# Patient Record
Sex: Female | Born: 1980 | Race: White | Hispanic: No | Marital: Married | Smoking: Never smoker
Health system: Southern US, Community
[De-identification: ages and names within clinical notes are randomized; demographics above are authoritative.]

## PROBLEM LIST (undated history)

## (undated) DIAGNOSIS — I10 Essential (primary) hypertension: Secondary | ICD-10-CM

## (undated) DIAGNOSIS — E119 Type 2 diabetes mellitus without complications: Secondary | ICD-10-CM

## (undated) DIAGNOSIS — K219 Gastro-esophageal reflux disease without esophagitis: Secondary | ICD-10-CM

## (undated) DIAGNOSIS — J45909 Unspecified asthma, uncomplicated: Secondary | ICD-10-CM

## (undated) DIAGNOSIS — Z9889 Other specified postprocedural states: Secondary | ICD-10-CM

## (undated) DIAGNOSIS — R112 Nausea with vomiting, unspecified: Secondary | ICD-10-CM

## (undated) DIAGNOSIS — C801 Malignant (primary) neoplasm, unspecified: Secondary | ICD-10-CM

## (undated) DIAGNOSIS — M1990 Unspecified osteoarthritis, unspecified site: Secondary | ICD-10-CM

## (undated) DIAGNOSIS — H353290 Exudative age-related macular degeneration, unspecified eye, stage unspecified: Secondary | ICD-10-CM

## (undated) HISTORY — DX: Unspecified asthma, uncomplicated: J45.909

---

## 2002-11-01 ENCOUNTER — Emergency Department (HOSPITAL_COMMUNITY): Admission: EM | Admit: 2002-11-01 | Discharge: 2002-11-02 | Payer: Self-pay | Admitting: Internal Medicine

## 2016-02-05 ENCOUNTER — Emergency Department (HOSPITAL_COMMUNITY): Payer: BLUE CROSS/BLUE SHIELD

## 2016-02-05 ENCOUNTER — Emergency Department (HOSPITAL_COMMUNITY)
Admission: EM | Admit: 2016-02-05 | Discharge: 2016-02-05 | Disposition: A | Discharge status: Home / Self Care | Payer: BLUE CROSS/BLUE SHIELD | Attending: Emergency Medicine | Admitting: Emergency Medicine

## 2016-02-05 ENCOUNTER — Encounter (HOSPITAL_COMMUNITY): Payer: Self-pay | Admitting: *Deleted

## 2016-02-05 DIAGNOSIS — M1990 Unspecified osteoarthritis, unspecified site: Secondary | ICD-10-CM | POA: Insufficient documentation

## 2016-02-05 DIAGNOSIS — I10 Essential (primary) hypertension: Secondary | ICD-10-CM | POA: Diagnosis not present

## 2016-02-05 DIAGNOSIS — R51 Headache: Secondary | ICD-10-CM | POA: Diagnosis present

## 2016-02-05 DIAGNOSIS — Z791 Long term (current) use of non-steroidal anti-inflammatories (NSAID): Secondary | ICD-10-CM | POA: Diagnosis not present

## 2016-02-05 DIAGNOSIS — G43909 Migraine, unspecified, not intractable, without status migrainosus: Secondary | ICD-10-CM | POA: Diagnosis not present

## 2016-02-05 DIAGNOSIS — Z7982 Long term (current) use of aspirin: Secondary | ICD-10-CM | POA: Diagnosis not present

## 2016-02-05 DIAGNOSIS — E119 Type 2 diabetes mellitus without complications: Secondary | ICD-10-CM | POA: Insufficient documentation

## 2016-02-05 HISTORY — DX: Gastro-esophageal reflux disease without esophagitis: K21.9

## 2016-02-05 HISTORY — DX: Unspecified osteoarthritis, unspecified site: M19.90

## 2016-02-05 HISTORY — DX: Essential (primary) hypertension: I10

## 2016-02-05 HISTORY — DX: Type 2 diabetes mellitus without complications: E11.9

## 2016-02-05 LAB — CBG MONITORING, ED: GLUCOSE-CAPILLARY: 273 mg/dL — AB (ref 65–99)

## 2016-02-05 LAB — BASIC METABOLIC PANEL
Anion gap: 13 (ref 5–15)
BUN: 15 mg/dL (ref 6–20)
CO2: 27 mmol/L (ref 22–32)
Calcium: 8.8 mg/dL — ABNORMAL LOW (ref 8.9–10.3)
Chloride: 96 mmol/L — ABNORMAL LOW (ref 101–111)
Creatinine, Ser: 0.62 mg/dL (ref 0.44–1.00)
GFR calc Af Amer: >60 mL/min (ref >60)
GFR calc non Af Amer: >60 mL/min (ref >60)
Glucose, Bld: 286 mg/dL — ABNORMAL HIGH (ref 65–99)
Potassium: 3.5 mmol/L (ref 3.5–5.1)
Sodium: 136 mmol/L (ref 135–145)

## 2016-02-05 LAB — BRAIN NATRIURETIC PEPTIDE: B Natriuretic Peptide: 53 pg/mL (ref 0.0–100.0)

## 2016-02-05 LAB — HCG, SERUM, QUALITATIVE: Preg, Serum: NEGATIVE

## 2016-02-05 MED ORDER — VALPROATE SODIUM 500 MG/5ML IV SOLN
INTRAVENOUS | Status: AC
  Filled 2016-02-05: qty 5

## 2016-02-05 MED ORDER — SODIUM CHLORIDE 0.9 % IV BOLUS (SEPSIS)
1000.0000 mL | Freq: Once | INTRAVENOUS | Status: AC
  Administered 2016-02-05: 1000 mL via INTRAVENOUS

## 2016-02-05 MED ORDER — DIPHENHYDRAMINE HCL 50 MG/ML IJ SOLN
25.0000 mg | Freq: Once | INTRAMUSCULAR | Status: AC
  Administered 2016-02-05: 25 mg via INTRAVENOUS
  Filled 2016-02-05: qty 1

## 2016-02-05 MED ORDER — KETOROLAC TROMETHAMINE 30 MG/ML IJ SOLN
30.0000 mg | Freq: Once | INTRAMUSCULAR | Status: AC
  Administered 2016-02-05: 30 mg via INTRAVENOUS
  Filled 2016-02-05: qty 1

## 2016-02-05 MED ORDER — METOCLOPRAMIDE HCL 5 MG/ML IJ SOLN
10.0000 mg | Freq: Once | INTRAMUSCULAR | Status: AC
  Administered 2016-02-05: 10 mg via INTRAVENOUS
  Filled 2016-02-05: qty 2

## 2016-02-05 MED ORDER — VALPROATE SODIUM 500 MG/5ML IV SOLN
500.0000 mg | Freq: Once | INTRAVENOUS | Status: AC
  Administered 2016-02-05: 500 mg via INTRAVENOUS
  Filled 2016-02-05: qty 5

## 2016-02-05 NOTE — Discharge Instructions (Signed)

## 2016-02-05 NOTE — ED Provider Notes (Signed)
CSN: [id number]     Arrival date & time 02/05/16  1737 History   First MD Initiated Contact with Patient 02/05/16 1908     Chief Complaint  Patient presents with  . Headache     HPI  Patient presents evaluation of headache and vomiting. She had 4 days of nausea and vomiting and was seen at [facility]. Had IV fluids and antiemetics and has done better. However the day after discharge which was yesterday she developed a headache. States it is global. Some facial however occipital as well. Not sudden in onset. She awakened with it yesterday morning. Does not feel as though it awakened her from sleep. No neck pain or stiffness. No fevers or chills. Again nausea his been better over the last 2 days next taking by mouth well today.  Past Medical History  Diagnosis Date  . Arthritis   . Diabetes mellitus without complication ([place])   . Hypertension   . GERD (gastroesophageal reflux disease)    History reviewed. No pertinent past surgical history. No family history on file. Social History  Substance Use Topics  . Smoking status: Never Smoker   . Smokeless tobacco: None  . Alcohol Use: No   OB History    No data available     Review of Systems  Constitutional: Negative for fever, chills, diaphoresis, appetite change and fatigue.  HENT: Negative for mouth sores, sore throat and trouble swallowing.   Eyes: Negative for visual disturbance.  Respiratory: Negative for cough, chest tightness, shortness of breath and wheezing.   Cardiovascular: Negative for chest pain.  Gastrointestinal: Positive for nausea. Negative for vomiting, abdominal pain and abdominal distention.  Endocrine: Negative for polydipsia, polyphagia and polyuria.  Genitourinary: Negative for dysuria, frequency and hematuria.  Musculoskeletal: Negative for gait problem.  Skin: Negative for color change, pallor and rash.  Neurological: Positive for headaches. Negative for dizziness, syncope and light-headedness.   Hematological: Does not bruise/bleed easily.  Psychiatric/Behavioral: Negative for behavioral problems and confusion.      Allergies  Review of patient's allergies indicates no known allergies.  Home Medications   Prior to Admission medications   Medication Sig Start Date End Date Taking? Authorizing Provider  ibuprofen (ADVIL,MOTRIN) 200 MG tablet Take 400 mg by mouth every 6 (six) hours as needed for mild pain or moderate pain.   Yes Historical Provider, MD  Phenylephrine-Aspirin (ALKA-SELTZER PLUS SINUS) 7.8-325 MG TBEF Take 1 tablet by mouth daily as needed (for headache/sinus pain).   Yes Historical Provider, MD   BP 133/96 mmHg  Pulse 99  Temp(Src) 98.3 F (36.8 C) (Oral)  Resp 18  Ht 5\' 1"  (1.549 m)  Wt 177 lb (80.287 kg)  BMI 33.46 kg/m2  SpO2 97% Physical Exam  Constitutional: She is oriented to person, place, and time. She appears well-developed and well-nourished. No distress.  HENT:  Head: Normocephalic.  Complains of discomfort over her maxilla and frontal sinuses. Nares show some slight congestion. He also drinks benign. TMs appear normal.  Eyes: Conjunctivae are normal. Pupils are equal, round, and reactive to light. No scleral icterus.  Neck: Normal range of motion. Neck supple. No thyromegaly present.  Neck is supple.  Cardiovascular: Normal rate and regular rhythm.  Exam reveals no gallop and no friction rub.   No murmur heard. Pulmonary/Chest: Effort normal and breath sounds normal. No respiratory distress. She has no wheezes. She has no rales.  Abdominal: Soft. Bowel sounds are normal. She exhibits no distension. There is no tenderness. There  is no rebound.  Musculoskeletal: Normal range of motion.  Neurological: She is alert and oriented to person, place, and time.  Normal neurological exam. Symmetric cranial nerves. Normal gait. Normal strength four extremities  Skin: Skin is warm and dry. No rash noted.  Psychiatric: She has a normal mood and affect.  Her behavior is normal.    ED Course  Procedures (including critical care time) Labs Review Labs Reviewed  BASIC METABOLIC PANEL - Abnormal; Notable for the following:    Chloride 96 (*)    Glucose, Bld 286 (*)    Calcium 8.8 (*)    All other components within normal limits  CBG MONITORING, ED - Abnormal; Notable for the following:    Glucose-Capillary 273 (*)    All other components within normal limits  BRAIN NATRIURETIC PEPTIDE  HCG, SERUM, QUALITATIVE    Imaging Review Ct Head Wo Contrast  02/05/2016  CLINICAL DATA:  Headache and dizziness; facial pressure.  Nausea. EXAM: CT HEAD WITHOUT CONTRAST CT MAXILLOFACIAL WITHOUT CONTRAST TECHNIQUE: Multidetector CT imaging of the head and maxillofacial structures were performed using the standard protocol without intravenous contrast. Multiplanar CT image reconstructions of the maxillofacial structures were also generated. COMPARISON:  None. FINDINGS: CT HEAD FINDINGS The ventricles are normal in size and configuration. There is no intracranial mass, hemorrhage, extra-axial fluid collection, or midline shift. The gray-white compartments are normal. No acute infarct evident. The bony calvarium appears intact. The mastoid air cells are clear. No intraorbital lesions are evident. CT MAXILLOFACIAL FINDINGS There is no demonstrable fracture or dislocation. No intraorbital lesions are identified. Orbits appear symmetric bilaterally. There is mild mucosal thickening in several ethmoid air cells. Paranasal sinuses elsewhere are clear. There is no air-fluid level. No bony destruction or expansion. Ostiomeatal unit complexes are patent bilaterally. There are concha bullosae bilaterally common anatomic variant. There is leftward deviation of the nasal septum. There is edema of the nasal turbinates on the left with narrowing of the left naris. Salivary glands appear symmetric and normal bilaterally. No adenopathy. Visualized pharynx appears normal. There are  prominent cavities in lower molars on the right. IMPRESSION: CT head:  Study within normal limits. CT maxillofacial: Mild mucosal thickening in several ethmoid air cells bilaterally. Other paranasal sinuses are clear. No air-fluid level. No bony destruction or expansion. There is mild leftward deviation of the nasal septum. There is nasal turbinate edema on the left with narrowing of the left naris. Ostiomeatal unit complexes appear patent bilaterally. No fracture or dislocation. Orbits appear symmetric and unremarkable bilaterally. Note that there are prominent cavities in lower molars on the right. Electronically Signed   By: Lowella Grip III M.D.   On: 02/05/2016 21:17   Ct Maxillofacial Wo Cm  02/05/2016  CLINICAL DATA:  Headache and dizziness; facial pressure.  Nausea. EXAM: CT HEAD WITHOUT CONTRAST CT MAXILLOFACIAL WITHOUT CONTRAST TECHNIQUE: Multidetector CT imaging of the head and maxillofacial structures were performed using the standard protocol without intravenous contrast. Multiplanar CT image reconstructions of the maxillofacial structures were also generated. COMPARISON:  None. FINDINGS: CT HEAD FINDINGS The ventricles are normal in size and configuration. There is no intracranial mass, hemorrhage, extra-axial fluid collection, or midline shift. The gray-white compartments are normal. No acute infarct evident. The bony calvarium appears intact. The mastoid air cells are clear. No intraorbital lesions are evident. CT MAXILLOFACIAL FINDINGS There is no demonstrable fracture or dislocation. No intraorbital lesions are identified. Orbits appear symmetric bilaterally. There is mild mucosal thickening in several ethmoid air cells.  Paranasal sinuses elsewhere are clear. There is no air-fluid level. No bony destruction or expansion. Ostiomeatal unit complexes are patent bilaterally. There are concha bullosae bilaterally common anatomic variant. There is leftward deviation of the nasal septum. There is  edema of the nasal turbinates on the left with narrowing of the left naris. Salivary glands appear symmetric and normal bilaterally. No adenopathy. Visualized pharynx appears normal. There are prominent cavities in lower molars on the right. IMPRESSION: CT head:  Study within normal limits. CT maxillofacial: Mild mucosal thickening in several ethmoid air cells bilaterally. Other paranasal sinuses are clear. No air-fluid level. No bony destruction or expansion. There is mild leftward deviation of the nasal septum. There is nasal turbinate edema on the left with narrowing of the left naris. Ostiomeatal unit complexes appear patent bilaterally. No fracture or dislocation. Orbits appear symmetric and unremarkable bilaterally. Note that there are prominent cavities in lower molars on the right. Electronically Signed   By: Lowella Grip III M.D.   On: 02/05/2016 21:17   I have personally reviewed and evaluated these images and lab results as part of my medical decision-making.   EKG Interpretation None      MDM   Final diagnoses:  Migraine without status migrainosus, not intractable, unspecified migraine type   Imaging is normal. No gross sinus abnormalities. Orally for headache with cocktail here. Discharge home. Primary care follow-up.  He is hyperglycemic here, but not acidotic. She feels comfortable managing her sugars at home.    Tanna Furry, MD 02/06/16 0001

## 2016-02-05 NOTE — ED Notes (Addendum)
Wednesday morning she woke up with a headache with dizziness present as well. Pt has tried OTC headache medication with no relief. Pt denies any unilateral weakness. Pt does have nausea, denies vomiting.

## 2017-09-05 HISTORY — PX: LAPAROSCOPIC SALPINGOOPHERECTOMY: SUR795

## 2018-04-05 DIAGNOSIS — C801 Malignant (primary) neoplasm, unspecified: Secondary | ICD-10-CM

## 2018-04-05 HISTORY — DX: Malignant (primary) neoplasm, unspecified: C80.1

## 2018-04-18 ENCOUNTER — Telehealth: Payer: Self-pay | Admitting: *Deleted

## 2018-04-18 NOTE — Telephone Encounter (Addendum)
Called and spoke with the patient regarding her new patient appt. Patient given new appt for tomorrow at [place] at [facility] at [place] and gave appt

## 2018-04-19 ENCOUNTER — Inpatient Hospital Stay: Payer: BLUE CROSS/BLUE SHIELD | Attending: Obstetrics | Admitting: Obstetrics

## 2018-04-19 ENCOUNTER — Other Ambulatory Visit (HOSPITAL_COMMUNITY)
Admission: RE | Admit: 2018-04-19 | Discharge: 2018-04-19 | Disposition: A | Discharge status: Home / Self Care | Payer: BLUE CROSS/BLUE SHIELD | Source: Ambulatory Visit | Attending: Obstetrics | Admitting: Obstetrics

## 2018-04-19 ENCOUNTER — Encounter: Payer: Self-pay | Admitting: Obstetrics

## 2018-04-19 VITALS — BP 121/87 | HR 98 | Temp 98.7°F | Resp 20 | Ht 61.5 in | Wt 166.8 lb

## 2018-04-19 DIAGNOSIS — C541 Malignant neoplasm of endometrium: Secondary | ICD-10-CM | POA: Insufficient documentation

## 2018-04-19 DIAGNOSIS — R87618 Other abnormal cytological findings on specimens from cervix uteri: Secondary | ICD-10-CM | POA: Diagnosis not present

## 2018-04-19 DIAGNOSIS — E119 Type 2 diabetes mellitus without complications: Secondary | ICD-10-CM | POA: Insufficient documentation

## 2018-04-19 DIAGNOSIS — I10 Essential (primary) hypertension: Secondary | ICD-10-CM | POA: Insufficient documentation

## 2018-04-19 DIAGNOSIS — Z5111 Encounter for antineoplastic chemotherapy: Secondary | ICD-10-CM | POA: Insufficient documentation

## 2018-04-19 DIAGNOSIS — R110 Nausea: Secondary | ICD-10-CM | POA: Insufficient documentation

## 2018-04-19 NOTE — H&P (View-Only) (Signed)
[place] at [facility] Note: New Patient FIRST VISIT   Consult was requested by Dr. 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  In addition an endometrial biopsy showing the same.  Chief Complaint  Patient presents with  . Endometrial ca [facility])    HPI: Ms. Lanique Gonzalo  is a very nice 37 y.o.  P0  She was in her usual state of health until the evening of 04/06/18 at which time she woke up with significant right lower quadrant pain.  She presented to the emergency room after the pain woke her up CT imaging was performed 04/07/2018 [facility] rocking him.  A 6.5 x 7 x 8.1 complex right adnexal mass was found with solid and fat components consistent with a teratoma.  No lymphadenopathy.  Small to moderate ascites and inflammatory changes were seen in the pelvis follow-up ultrasound was also performed showing concern for right ovarian torsion with a large ovarian mass cystic and solid components 9.7 cm.  A left ovarian cyst seen 3.4 cm.  Endometrial thickening with hypervascularity and endometrial thickness of 24 mm moderate pelvic free fluid.  Given the concern for torsion she was taken urgently to the operating room by Dr. Orson Ape. McLeod 04/07/2018.  At that time operative laparoscopy with right subbing oophorectomy and findings consistent with torsion was performed.  In addition endometrial biopsy was performed.  Unfortunately final pathology reveals in the right ovary and endometrioid type adenocarcinoma the endometrial biopsy showed endometrioid type adenocarcinoma.  Comments include a favoring of endometrium as the primary.  The patient is therefore referred for recommendations and probable management.  She denied nausea , vomiting, denied recent bloating or pain denied any changes in her bowel habits or urinary habits.  She denies excess weight loss and she states she has a normal  appetite.  She does admit to a long history of abnormal menses.  She states that she started her menstrual cycle at age 59.  There is documentation on the chart of possible PCOS.  Periods are described as both irregular and heavy with episodes intermittently of amenorrhea.  Imported EPIC Oncologic History:   No history exists.    ECOG PERFORMANCE STATUS: 1 - Symptomatic but completely ambulatory  Measurement of disease:  Plan for Ca1 25 on the preop labs which may be falsely elevated given the recent surgery. .   Radiology: . No results found. . 04/07/18 - CT AP and TVUS as noted in HPI  Outpatient Encounter Medications as of 04/19/2018  Medication Sig  . amLODipine (NORVASC) 5 MG tablet Take by mouth.  . empagliflozin (JARDIANCE) 10 MG TABS tablet Take by mouth.  Marland Kitchen lisinopril (PRINIVIL,ZESTRIL) 20 MG tablet   . metFORMIN (GLUCOPHAGE) 500 MG tablet Take by mouth.  . insulin degludec (TRESIBA FLEXTOUCH) 100 UNIT/ML SOPN FlexTouch Pen Pt said she is not sure of dose, she goes to 13 clicks over. Not taken since surgery because it is an injection abd  . [DISCONTINUED] ibuprofen (ADVIL,MOTRIN) 200 MG tablet Take 400 mg by mouth every 6 (six) hours as needed for mild pain or moderate pain.  . [DISCONTINUED] Phenylephrine-Aspirin (ALKA-SELTZER PLUS SINUS) 7.8-325 MG TBEF Take 1 tablet by mouth daily as needed (for headache/sinus pain).   No facility-administered encounter medications on file as of 04/19/2018.    No Known Allergies  Past Medical History:  Diagnosis Date  . Arthritis   . Asthma   .  Diabetes mellitus without complication ([place])   . GERD (gastroesophageal reflux disease)   . Hypertension   . Torsion of ovary or tube    Past Surgical History:  Procedure Laterality Date  . LAPAROSCOPIC SALPINGOOPHERECTOMY Right 2019   torsion        Past Gynecological History:   GYNECOLOGIC HISTORY:  . No LMP recorded. (Menstrual status: Irregular Periods).  . Menarche: 38 years old . P  0 . Contraceptive none . HRT N/A  . Last Pap sure Family Hx:  Family History  Problem Relation Age of Onset  . Diabetes Mother   . Cervical cancer Mother   . Heart attack Father   . Diabetes Brother    Social Hx:  Marland Kitchen Tobacco use: None . Alcohol use: None . Illicit Drug use: None . Illicit IV Drug use: Never    Review of Systems: Review of Systems  Genitourinary: Positive for vaginal bleeding.   All other systems reviewed and are negative.  Vitals: Blood pressure 121/87, pulse 98, temperature 98.7 F (37.1 C), temperature source Oral, resp. rate 20, height 5' 1.5" (1.562 m), weight 166 lb 12.8 oz (75.7 kg), SpO2 100 %. Vitals:   04/19/18 1252  Weight: 166 lb 12.8 oz (75.7 kg)  Height: 5' 1.5" (1.562 m)    Vitals:   04/19/18 1252  BP: 121/87  Pulse: 98  Resp: 20  Temp: 98.7 F (37.1 C)  SpO2: 100%   Body mass index is 31.01 kg/m.   Physical Exam: General :  Obese well developed, 37 y.o., female in no apparent distress HEENT:  Normocephalic/atraumatic, symmetric, EOMI, eyelids normal Neck:   Supple, no masses.  Lymphatics:  No cervical/ submandibular/ supraclavicular/ infraclavicular/ inguinal adenopathy Respiratory:  Respirations unlabored, no use of accessory muscles CV:   Deferred Breast:  Deferred Musculoskeletal: No CVA tenderness, normal muscle strength. Abdomen:  Small pannus, trocar sites are healing. Soft, non-tender and nondistended. No evidence of hernia. No masses. Extremities:  No lymphedema, no erythema, non-tender. Skin:   Normal inspection Neuro/Psych:  No focal motor deficit, no abnormal mental status. Normal gait. Normal affect. Alert and oriented to person, place, and time  Genito Urinary: Vulva: Normal external female genitalia.  Bladder/urethra: Urethral meatus normal in size and location. No lesions or   masses, well supported bladder Speculum exam: Vagina: No lesion, no discharge, no bleeding. Cervix: Normal appearing, no  lesions. Bimanual exam:  Uterus: Normal size, mobile.  Adnexa: No masses. Rectovaginal:  Good tone, no masses, no cul de sac nodularity, no parametrial involvement or nodularity.   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 Data reviewed ? I reviewed the radiology reports as the images were not available for my review and discussed my interpretation with the patient ? Specifically the uterine lining is thickened quite a bit at 29mm on TVUS.  ? Reassuring is the absence of obvious LAD on CT imaging. ? The left ovary on TVUS also has a cyst with "irregular nodules" so that is a little concerning.  ? I reviewed her referring doctor's office notes and I have summarized in the HPI ? History was obtained from the patient and her husband ? She has no tumor marker for review but that will be ordered on the preop labs 2. Etiology of disease seems to be the uterus. Double primary needs to be kept in mind, however there may be no way to prove that definitively and like we will be recommending chemotherapy  postop. We discussed this today. 3. We did discuss referral to genetics today given her young age and that will be done on a future visit 4. Surgery discussion ? We reviewed the risks, benefits, and alternatives of surgery today. We also reviewed the surgical sketch and a copy was given to her today ? Recommendation is for completion surgery laparoscopic (robotic) hysterectomy, LSO, omentectomy and washings. She is already presumed Stage 3 uterine and given recent data from [facility] showing lack of benefit for adjuvant chemo-radiation therapy over chemotherapy alone I do not see a benefit to nodal dissection. We did discuss this in simple terms today 5. Pending items ? Pap done today with HPV ? CA125 6. RTC 10-14 days postop   Face to face time with patient was >60 minutes. Over 50% of this time was spent on counseling and coordination of care.  [name], MD  04/19/2018, 2:21 PM    Cc: Albertina Parr, MD (Referring Ob/Gyn) Monico Blitz, MD (PCP)

## 2018-04-19 NOTE — Progress Notes (Addendum)
[place] at [facility] Note: New Patient FIRST VISIT   Consult was requested by Dr. 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  In addition an endometrial biopsy showing the same.  Chief Complaint  Patient presents with  . Endometrial ca [facility])    HPI: [name]  is a very nice 37 y.o.  P0  She was in her usual state of health until the evening of 04/06/18 at which time she woke up with significant right lower quadrant pain.  She presented to the emergency room after the pain woke her up CT imaging was performed 04/07/2018 [facility] rocking him.  A 6.5 x 7 x 8.1 complex right adnexal mass was found with solid and fat components consistent with a teratoma.  No lymphadenopathy.  Small to moderate ascites and inflammatory changes were seen in the pelvis follow-up ultrasound was also performed showing concern for right ovarian torsion with a large ovarian mass cystic and solid components 9.7 cm.  A left ovarian cyst seen 3.4 cm.  Endometrial thickening with hypervascularity and endometrial thickness of 24 mm moderate pelvic free fluid.  Given the concern for torsion she was taken urgently to the operating room by Dr. Orson Ape. McLeod 04/07/2018.  At that time operative laparoscopy with right subbing oophorectomy and findings consistent with torsion was performed.  In addition endometrial biopsy was performed.  Unfortunately final pathology reveals in the right ovary and endometrioid type adenocarcinoma the endometrial biopsy showed endometrioid type adenocarcinoma.  Comments include a favoring of endometrium as the primary.  The patient is therefore referred for recommendations and probable management.  She denied nausea , vomiting, denied recent bloating or pain denied any changes in her bowel habits or urinary habits.  She denies excess weight loss and she states she has a normal  appetite.  She does admit to a long history of abnormal menses.  She states that she started her menstrual cycle at age 48.  There is documentation on the chart of possible PCOS.  Periods are described as both irregular and heavy with episodes intermittently of amenorrhea.  Imported EPIC Oncologic History:   No history exists.    ECOG PERFORMANCE STATUS: 1 - Symptomatic but completely ambulatory  Measurement of disease:  Plan for Ca1 25 on the preop labs which may be falsely elevated given the recent surgery. .   Radiology: . No results found. . 04/07/18 - CT AP and TVUS as noted in HPI  Outpatient Encounter Medications as of 04/19/2018  Medication Sig  . amLODipine (NORVASC) 5 MG tablet Take by mouth.  . empagliflozin (JARDIANCE) 10 MG TABS tablet Take by mouth.  Marland Kitchen lisinopril (PRINIVIL,ZESTRIL) 20 MG tablet   . metFORMIN (GLUCOPHAGE) 500 MG tablet Take by mouth.  . insulin degludec (TRESIBA FLEXTOUCH) 100 UNIT/ML SOPN FlexTouch Pen Pt said she is not sure of dose, she goes to 13 clicks over. Not taken since surgery because it is an injection abd  . [DISCONTINUED] ibuprofen (ADVIL,MOTRIN) 200 MG tablet Take 400 mg by mouth every 6 (six) hours as needed for mild pain or moderate pain.  . [DISCONTINUED] Phenylephrine-Aspirin (ALKA-SELTZER PLUS SINUS) 7.8-325 MG TBEF Take 1 tablet by mouth daily as needed (for headache/sinus pain).   No facility-administered encounter medications on file as of 04/19/2018.    No Known Allergies  Past Medical History:  Diagnosis Date  . Arthritis   . Asthma   .  Diabetes mellitus without complication ([place])   . GERD (gastroesophageal reflux disease)   . Hypertension   . Torsion of ovary or tube    Past Surgical History:  Procedure Laterality Date  . LAPAROSCOPIC SALPINGOOPHERECTOMY Right 2019   torsion        Past Gynecological History:   GYNECOLOGIC HISTORY:  . No LMP recorded. (Menstrual status: Irregular Periods).  . Menarche: 37 years old . P  0 . Contraceptive none . HRT N/A  . Last Pap sure Family Hx:  Family History  Problem Relation Age of Onset  . Diabetes Mother   . Cervical cancer Mother   . Heart attack Father   . Diabetes Brother    Social Hx:  Marland Kitchen Tobacco use: None . Alcohol use: None . Illicit Drug use: None . Illicit IV Drug use: Never    Review of Systems: Review of Systems  Genitourinary: Positive for vaginal bleeding.   All other systems reviewed and are negative.  Vitals: Blood pressure 121/87, pulse 98, temperature 98.7 F (37.1 C), temperature source Oral, resp. rate 20, height 5' 1.5" (1.562 m), weight 166 lb 12.8 oz (75.7 kg), SpO2 100 %. Vitals:   04/19/18 1252  Weight: 166 lb 12.8 oz (75.7 kg)  Height: 5' 1.5" (1.562 m)    Vitals:   04/19/18 1252  BP: 121/87  Pulse: 98  Resp: 20  Temp: 98.7 F (37.1 C)  SpO2: 100%   Body mass index is 31.01 kg/m.   Physical Exam: General :  Obese well developed, 37 y.o., female in no apparent distress HEENT:  Normocephalic/atraumatic, symmetric, EOMI, eyelids normal Neck:   Supple, no masses.  Lymphatics:  No cervical/ submandibular/ supraclavicular/ infraclavicular/ inguinal adenopathy Respiratory:  Respirations unlabored, no use of accessory muscles CV:   Deferred Breast:  Deferred Musculoskeletal: No CVA tenderness, normal muscle strength. Abdomen:  Small pannus, trocar sites are healing. Soft, non-tender and nondistended. No evidence of hernia. No masses. Extremities:  No lymphedema, no erythema, non-tender. Skin:   Normal inspection Neuro/Psych:  No focal motor deficit, no abnormal mental status. Normal gait. Normal affect. Alert and oriented to person, place, and time  Genito Urinary: Vulva: Normal external female genitalia.  Bladder/urethra: Urethral meatus normal in size and location. No lesions or   masses, well supported bladder Speculum exam: Vagina: No lesion, no discharge, no bleeding. Cervix: Normal appearing, no  lesions. Bimanual exam:  Uterus: Normal size, mobile.  Adnexa: No masses. Rectovaginal:  Good tone, no masses, no cul de sac nodularity, no parametrial involvement or nodularity.   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 Data reviewed ? I reviewed the radiology reports as the images were not available for my review and discussed my interpretation with the patient ? Specifically the uterine lining is thickened quite a bit at 81mm on TVUS.  ? Reassuring is the absence of obvious LAD on CT imaging. ? The left ovary on TVUS also has a cyst with "irregular nodules" so that is a little concerning.  ? I reviewed her referring doctor's office notes and I have summarized in the HPI ? History was obtained from the patient and her husband ? She has no tumor marker for review but that will be ordered on the preop labs 2. Etiology of disease seems to be the uterus. Double primary needs to be kept in mind, however there may be no way to prove that definitively and like we will be recommending chemotherapy  postop. We discussed this today. 3. We did discuss referral to genetics today given her young age and that will be done on a future visit 4. Surgery discussion ? We reviewed the risks, benefits, and alternatives of surgery today. We also reviewed the surgical sketch and a copy was given to her today ? Recommendation is for completion surgery laparoscopic (robotic) hysterectomy, LSO, omentectomy and washings. She is already presumed Stage 3 uterine and given recent data from [facility] showing lack of benefit for adjuvant chemo-radiation therapy over chemotherapy alone I do not see a benefit to nodal dissection. We did discuss this in simple terms today 5. Pending items ? Pap done today with HPV ? CA125 6. RTC 10-14 days postop   Face to face time with patient was >60 minutes. Over 50% of this time was spent on counseling and coordination of care.  [name], MD  04/19/2018, 2:21 PM    Cc: Albertina Parr, MD (Referring Ob/Gyn) Monico Blitz, MD (PCP)

## 2018-04-19 NOTE — Patient Instructions (Signed)
Preparing for your Surgery  Plan for surgery on April 24, 2018 with Dr. Precious Haws at [place] will be scheduled for a robotic assisted total laparoscopic hysterectomy, unilateral salpingo-oophorectomy, omentectomy, possible laparotomy.  Pre-operative Testing -You will receive a phone call from presurgical testing at [facility] to arrange for a pre-operative testing appointment before your surgery.  This appointment normally occurs one to two weeks before your scheduled surgery.   -Bring your insurance card, copy of an advanced directive if applicable, medication list  -At that visit, you will be asked to sign a consent for a possible blood transfusion in case a transfusion becomes necessary during surgery.  The need for a blood transfusion is rare but having consent is a necessary part of your care.     -You should not be taking blood thinners or aspirin at least ten days prior to surgery unless instructed by your surgeon.  Day Before Surgery at [place] will be asked to take in a light diet the day before surgery.  Avoid carbonated beverages.  You will be advised to have nothing to eat or drink after midnight the evening before.    Eat a light diet the day before surgery.  Examples including soups, broths, toast, yogurt, mashed potatoes.  Things to avoid include carbonated beverages (fizzy beverages), raw fruits and raw vegetables, or beans.   If your bowels are filled with gas, your surgeon will have difficulty visualizing your pelvic organs which increases your surgical risks.  Your role in recovery Your role is to become active as soon as directed by your doctor, while still giving yourself time to heal.  Rest when you feel tired. You will be asked to do the following in order to speed your recovery:  - Cough and breathe deeply. This helps toclear and expand your lungs and can prevent pneumonia. You may be given a spirometer to practice deep  breathing. A staff member will show you how to use the spirometer. - Do mild physical activity. Walking or moving your legs help your circulation and body functions return to normal. A staff member will help you when you try to walk and will provide you with simple exercises. Do not try to get up or walk alone the first time. - Actively manage your pain. Managing your pain lets you move in comfort. We will ask you to rate your pain on a scale of zero to 10. It is your responsibility to tell your doctor or nurse where and how much you hurt so your pain can be treated.  Special Considerations -If you are diabetic, you may be placed on insulin after surgery to have closer control over your blood sugars to promote healing and recovery.  This does not mean that you will be discharged on insulin.  If applicable, your oral antidiabetics will be resumed when you are tolerating a solid diet.  -Your final pathology results from surgery should be available by the Friday after surgery and the results will be relayed to you when available.  -Dr. Lahoma Crocker is the Surgeon that assists your GYN Oncologist with surgery.  The next day after your surgery you will either see your GYN Oncologist or Dr. Lahoma Crocker.   Blood Transfusion Information WHAT IS A BLOOD TRANSFUSION? A transfusion is the replacement of blood or some of its parts. Blood is made up of multiple cells which provide different functions.  Red blood cells carry oxygen and are used for blood  loss replacement.  White blood cells fight against infection.  Platelets control bleeding.  Plasma helps clot blood.  Other blood products are available for specialized needs, such as hemophilia or other clotting disorders. BEFORE THE TRANSFUSION  Who gives blood for transfusions?   You may be able to donate blood to be used at a later date on yourself (autologous donation).  Relatives can be asked to donate blood. This is generally not  any safer than if you have received blood from a stranger. The same precautions are taken to ensure safety when a relative's blood is donated.  Healthy volunteers who are fully evaluated to make sure their blood is safe. This is blood bank blood. Transfusion therapy is the safest it has ever been in the practice of medicine. Before blood is taken from a donor, a complete history is taken to make sure that person has no history of diseases nor engages in risky social behavior (examples are intravenous drug use or sexual activity with multiple partners). The donor's travel history is screened to minimize risk of transmitting infections, such as malaria. The donated blood is tested for signs of infectious diseases, such as HIV and hepatitis. The blood is then tested to be sure it is compatible with you in order to minimize the chance of a transfusion reaction. If you or a relative donates blood, this is often done in anticipation of surgery and is not appropriate for emergency situations. It takes many days to process the donated blood. RISKS AND COMPLICATIONS Although transfusion therapy is very safe and saves many lives, the main dangers of transfusion include:   Getting an infectious disease.  Developing a transfusion reaction. This is an allergic reaction to something in the blood you were given. Every precaution is taken to prevent this. The decision to have a blood transfusion has been considered carefully by your caregiver before blood is given. Blood is not given unless the benefits outweigh the risks.

## 2018-04-20 ENCOUNTER — Encounter: Payer: Self-pay | Admitting: Obstetrics

## 2018-04-20 ENCOUNTER — Other Ambulatory Visit: Payer: Self-pay | Admitting: Gynecologic Oncology

## 2018-04-20 ENCOUNTER — Telehealth: Payer: Self-pay

## 2018-04-20 LAB — CYTOLOGY - PAP: HPV: NOT DETECTED

## 2018-04-20 NOTE — Patient Instructions (Addendum)
Kathryn Stewart  04/20/2018   Your procedure is scheduled on: Tuesday 0820/2019  Report to [facility] Main  Entrance              Report to admitting at   1100   AM    Call this number if you have problems the morning of surgery [phone]   Eat a light diet the day before surgery.  Examples including soups, broths, toast, yogurt, mashed potatoes.  Things to avoid include carbonated beverages (fizzy beverages), raw fruits and raw vegetables, or beans.   If your bowels are filled with gas, your surgeon will have difficulty visualizing your pelvic organs which increases your surgical risks.  No solid food after midnight ! May have clear liquids from midnight up until 0730 am then nothing until after surgery!  CLEAR LIQUID DIET   Foods Allowed                                                                     Foods Excluded  Coffee and tea, regular and decaf                             liquids that you cannot  Plain Jell-O in any flavor                                             see through such as: Fruit ices (not with fruit pulp)                                     milk, soups, orange juice  Iced Popsicles                                    All solid food Carbonated beverages, regular and diet                                    Cranberry, grape and apple juices Sports drinks like Gatorade Lightly seasoned clear broth or consume(fat free) Sugar, honey syrup  Sample Menu Breakfast                                Lunch                                     Supper Cranberry juice                    Beef broth                            Chicken broth Jell-O  Grape juice                           Apple juice Coffee or tea                        Jell-O                                      Popsicle                                                Coffee or tea                        Coffee or  tea  _____________________________________________________________________  How to Manage Your Diabetes Before and After Surgery  Why is it important to control my blood sugar before and after surgery? . Improving blood sugar levels before and after surgery helps healing and can limit problems. . A way of improving blood sugar control is eating a healthy diet by: o  Eating less sugar and carbohydrates o  Increasing activity/exercise o  Talking with your doctor about reaching your blood sugar goals . High blood sugars (greater than 180 mg/dL) can raise your risk of infections and slow your recovery, so you will need to focus on controlling your diabetes during the weeks before surgery. . Make sure that the doctor who takes care of your diabetes knows about your planned surgery including the date and location.  How do I manage my blood sugar before surgery? . Check your blood sugar at least 4 times a day, starting 2 days before surgery, to make sure that the level is not too high or low. o Check your blood sugar the morning of your surgery when you wake up and every 2 hours until you get to the Short Stay unit. . If your blood sugar is less than 70 mg/dL, you will need to treat for low blood sugar: o Do not take insulin. o Treat a low blood sugar (less than 70 mg/dL) with  cup of clear juice (cranberry or apple), 4 glucose tablets, OR glucose gel. o Recheck blood sugar in 15 minutes after treatment (to make sure it is greater than 70 mg/dL). If your blood sugar is not greater than 70 mg/dL on recheck, call [phone] for further instructions. . Report your blood sugar to the short stay nurse when you get to Short Stay.  . If you are admitted to the hospital after surgery: o Your blood sugar will be checked by the staff and you will probably be given insulin after surgery (instead of oral diabetes medicines) to make sure you have good blood sugar levels. o The goal for blood sugar control  after surgery is 80-180 mg/dL.   WHAT DO I DO ABOUT MY DIABETES MEDICATION?        Take the day before surgery , Metformin (Glucophagwe ) as usual!         Take the day before surgery, Empagliflozin (jardiance) as usual!  . Do not take oral diabetes medicines (pills) the morning of surgery!       . THE MORNING OF SURGERY, take 6  units of  Tresiba       insulin.  .   Remember: Do not eat food or drink liquids :After Midnight.     Take these medicines the morning of surgery with A SIP OF WATER: NONE  DO NOT TAKE ANY DIABETIC MEDICATIONS DAY OF YOUR SURGERY                               You may not have any metal on your body including hair pins and              piercings  Do not wear jewelry, make-up, lotions, powders or perfumes, deodorant             Do not wear nail polish.  Do not shave  48 hours prior to surgery.                Do not bring valuables to the hospital. [place].  Contacts, dentures or bridgework may not be worn into surgery.  Leave suitcase in the car. After surgery it may be brought to your room.                   Please read over the following fact sheets you were given: _____________________________________________________________________             [facility] - Preparing for Surgery Before surgery, you can play an important role.  Because skin is not sterile, your skin needs to be as free of germs as possible.  You can reduce the number of germs on your skin by washing with CHG (chlorahexidine gluconate) soap before surgery.  CHG is an antiseptic cleaner which kills germs and bonds with the skin to continue killing germs even after washing. Please DO NOT use if you have an allergy to CHG or antibacterial soaps.  If your skin becomes reddened/irritated stop using the CHG and inform your nurse when you arrive at Short Stay. Do not shave (including legs and underarms) for at least 48 hours prior to  the first CHG shower.  You may shave your face/neck. Please follow these instructions carefully:  1.  Shower with CHG Soap the night before surgery and the  morning of Surgery.  2.  If you choose to wash your hair, wash your hair first as usual with your  normal  shampoo.  3.  After you shampoo, rinse your hair and body thoroughly to remove the  shampoo.                           4.  Use CHG as you would any other liquid soap.  You can apply chg directly  to the skin and wash                       Gently with a scrungie or clean washcloth.  5.  Apply the CHG Soap to your body ONLY FROM THE NECK DOWN.   Do not use on face/ open                           Wound or open sores. Avoid contact with eyes, ears mouth and genitals (private parts).  Wash face,  Genitals (private parts) with your normal soap.             6.  Wash thoroughly, paying special attention to the area where your surgery  will be performed.  7.  Thoroughly rinse your body with warm water from the neck down.  8.  DO NOT shower/wash with your normal soap after using and rinsing off  the CHG Soap.                9.  Pat yourself dry with a clean towel.            10.  Wear clean pajamas.            11.  Place clean sheets on your bed the night of your first shower and do not  sleep with pets. Day of Surgery : Do not apply any lotions/deodorants the morning of surgery.  Please wear clean clothes to the hospital/surgery center.  FAILURE TO FOLLOW THESE INSTRUCTIONS MAY RESULT IN THE CANCELLATION OF YOUR SURGERY PATIENT SIGNATURE_________________________________  NURSE SIGNATURE__________________________________  ________________________________________________________________________   Adam Phenix  An incentive spirometer is a tool that can help keep your lungs clear and active. This tool measures how well you are filling your lungs with each breath. Taking long deep breaths may help reverse or  decrease the chance of developing breathing (pulmonary) problems (especially infection) following:  A long period of time when you are unable to move or be active. BEFORE THE PROCEDURE   If the spirometer includes an indicator to show your best effort, your nurse or respiratory therapist will set it to a desired goal.  If possible, sit up straight or lean slightly forward. Try not to slouch.  Hold the incentive spirometer in an upright position. INSTRUCTIONS FOR USE  1. Sit on the edge of your bed if possible, or sit up as far as you can in bed or on a chair. 2. Hold the incentive spirometer in an upright position. 3. Breathe out normally. 4. Place the mouthpiece in your mouth and seal your lips tightly around it. 5. Breathe in slowly and as deeply as possible, raising the piston or the ball toward the top of the column. 6. Hold your breath for 3-5 seconds or for as long as possible. Allow the piston or ball to fall to the bottom of the column. 7. Remove the mouthpiece from your mouth and breathe out normally. 8. Rest for a few seconds and repeat Steps 1 through 7 at least 10 times every 1-2 hours when you are awake. Take your time and take a few normal breaths between deep breaths. 9. The spirometer may include an indicator to show your best effort. Use the indicator as a goal to work toward during each repetition. 10. After each set of 10 deep breaths, practice coughing to be sure your lungs are clear. If you have an incision (the cut made at the time of surgery), support your incision when coughing by placing a pillow or rolled up towels firmly against it. Once you are able to get out of bed, walk around indoors and cough well. You may stop using the incentive spirometer when instructed by your caregiver.  RISKS AND COMPLICATIONS  Take your time so you do not get dizzy or light-headed.  If you are in pain, you may need to take or ask for pain medication before doing incentive spirometry.  It is harder to take a deep breath if you are having  pain. AFTER USE  Rest and breathe slowly and easily.  It can be helpful to keep track of a log of your progress. Your caregiver can provide you with a simple table to help with this. If you are using the spirometer at home, follow these instructions: [place] IF:   You are having difficultly using the spirometer.  You have trouble using the spirometer as often as instructed.  Your pain medication is not giving enough relief while using the spirometer.  You develop fever of 100.5 F (38.1 C) or higher. SEEK IMMEDIATE MEDICAL CARE IF:   You cough up bloody sputum that had not been present before.  You develop fever of 102 F (38.9 C) or greater.  You develop worsening pain at or near the incision site. MAKE SURE YOU:   Understand these instructions.  Will watch your condition.  Will get help right away if you are not doing well or get worse. Document Released: 01/02/2007 Document Revised: 11/14/2011 Document Reviewed: 03/05/2007 Exit[employer] Patient Information 2014 Exit[employer], [place].   ________________________________________________________________________  WHAT IS A BLOOD TRANSFUSION? Blood Transfusion Information  A transfusion is the replacement of blood or some of its parts. Blood is made up of multiple cells which provide different functions.  Red blood cells carry oxygen and are used for blood loss replacement.  White blood cells fight against infection.  Platelets control bleeding.  Plasma helps clot blood.  Other blood products are available for specialized needs, such as hemophilia or other clotting disorders. BEFORE THE TRANSFUSION  Who gives blood for transfusions?   Healthy volunteers who are fully evaluated to make sure their blood is safe. This is blood bank blood. Transfusion therapy is the safest it has ever been in the practice of medicine. Before blood is taken from a donor, a complete  history is taken to make sure that person has no history of diseases nor engages in risky social behavior (examples are intravenous drug use or sexual activity with multiple partners). The donor's travel history is screened to minimize risk of transmitting infections, such as malaria. The donated blood is tested for signs of infectious diseases, such as HIV and hepatitis. The blood is then tested to be sure it is compatible with you in order to minimize the chance of a transfusion reaction. If you or a relative donates blood, this is often done in anticipation of surgery and is not appropriate for emergency situations. It takes many days to process the donated blood. RISKS AND COMPLICATIONS Although transfusion therapy is very safe and saves many lives, the main dangers of transfusion include:   Getting an infectious disease.  Developing a transfusion reaction. This is an allergic reaction to something in the blood you were given. Every precaution is taken to prevent this. The decision to have a blood transfusion has been considered carefully by your caregiver before blood is given. Blood is not given unless the benefits outweigh the risks. AFTER THE TRANSFUSION  Right after receiving a blood transfusion, you will usually feel much better and more energetic. This is especially true if your red blood cells have gotten low (anemic). The transfusion raises the level of the red blood cells which carry oxygen, and this usually causes an energy increase.  The nurse administering the transfusion will monitor you carefully for complications. HOME CARE INSTRUCTIONS  No special instructions are needed after a transfusion. You may find your energy is better. Speak with your caregiver about any limitations on activity for underlying diseases  you may have. SEEK MEDICAL CARE IF:   Your condition is not improving after your transfusion.  You develop redness or irritation at the intravenous (IV) site. SEEK  IMMEDIATE MEDICAL CARE IF:  Any of the following symptoms occur over the next 12 hours:  Shaking chills.  You have a temperature by mouth above 102 F (38.9 C), not controlled by medicine.  Chest, back, or muscle pain.  People around you feel you are not acting correctly or are confused.  Shortness of breath or difficulty breathing.  Dizziness and fainting.  You get a rash or develop hives.  You have a decrease in urine output.  Your urine turns a dark color or changes to pink, red, or brown. Any of the following symptoms occur over the next 10 days:  You have a temperature by mouth above 102 F (38.9 C), not controlled by medicine.  Shortness of breath.  Weakness after normal activity.  The white part of the eye turns yellow (jaundice).  You have a decrease in the amount of urine or are urinating less often.  Your urine turns a dark color or changes to pink, red, or brown. Document Released: 08/19/2000 Document Revised: 11/14/2011 Document Reviewed: 04/07/2008 [facility] Patient Information 2014 [place], [place].  _______________________________________________________________________

## 2018-04-20 NOTE — Telephone Encounter (Signed)
Spoke with Kathryn Stewart to see if she restarted her insulin after her visit yesterday as discussed with Dr. Gerarda Fraction. Pt said she resumed it this morning. Told her that her HgbA1c was ~ 14 the last time it was checked which is high.  She will have this checked again at her pre op appointment 04-23-18.   Her surgery could be cancelled if her A1c is high per Joylene John, NP as it puts her at greater risk for surgery.   Pt verbalized understanding.

## 2018-04-23 ENCOUNTER — Encounter (HOSPITAL_COMMUNITY)
Admission: RE | Admit: 2018-04-23 | Discharge: 2018-04-23 | Disposition: A | Discharge status: Home / Self Care | Payer: BLUE CROSS/BLUE SHIELD | Source: Ambulatory Visit | Attending: Obstetrics | Admitting: Obstetrics

## 2018-04-23 ENCOUNTER — Ambulatory Visit (HOSPITAL_COMMUNITY)
Admission: RE | Admit: 2018-04-23 | Discharge: 2018-04-23 | Disposition: A | Discharge status: Home / Self Care | Payer: BLUE CROSS/BLUE SHIELD | Source: Ambulatory Visit | Attending: Gynecologic Oncology | Admitting: Gynecologic Oncology

## 2018-04-23 ENCOUNTER — Encounter (HOSPITAL_COMMUNITY): Payer: Self-pay

## 2018-04-23 ENCOUNTER — Other Ambulatory Visit: Payer: Self-pay

## 2018-04-23 DIAGNOSIS — Z01810 Encounter for preprocedural cardiovascular examination: Secondary | ICD-10-CM | POA: Insufficient documentation

## 2018-04-23 DIAGNOSIS — Z01812 Encounter for preprocedural laboratory examination: Secondary | ICD-10-CM | POA: Insufficient documentation

## 2018-04-23 DIAGNOSIS — C541 Malignant neoplasm of endometrium: Secondary | ICD-10-CM

## 2018-04-23 HISTORY — DX: Nausea with vomiting, unspecified: R11.2

## 2018-04-23 HISTORY — DX: Other specified postprocedural states: Z98.890

## 2018-04-23 LAB — COMPREHENSIVE METABOLIC PANEL
ALBUMIN: 4.1 g/dL (ref 3.5–5.0)
ALT: 26 U/L (ref 0–44)
AST: 19 U/L (ref 15–41)
Alkaline Phosphatase: 58 U/L (ref 38–126)
Anion gap: 9 (ref 5–15)
BUN: 31 mg/dL — ABNORMAL HIGH (ref 6–20)
CHLORIDE: 102 mmol/L (ref 98–111)
CO2: 29 mmol/L (ref 22–32)
CREATININE: 0.91 mg/dL (ref 0.44–1.00)
Calcium: 10.2 mg/dL (ref 8.9–10.3)
GFR calc non Af Amer: >60 mL/min (ref >60)
Glucose, Bld: 170 mg/dL — ABNORMAL HIGH (ref 70–99)
Potassium: 4.8 mmol/L (ref 3.5–5.1)
SODIUM: 140 mmol/L (ref 135–145)
Total Bilirubin: 0.8 mg/dL (ref 0.3–1.2)
Total Protein: 7.9 g/dL (ref 6.5–8.1)

## 2018-04-23 LAB — URINALYSIS, ROUTINE W REFLEX MICROSCOPIC
Bilirubin Urine: NEGATIVE
Glucose, UA: >=500 mg/dL — AB
Hgb urine dipstick: NEGATIVE
Ketones, ur: NEGATIVE mg/dL
LEUKOCYTES UA: NEGATIVE
NITRITE: NEGATIVE
PH: 5 (ref 5.0–8.0)
Protein, ur: NEGATIVE mg/dL
SPECIFIC GRAVITY, URINE: 1.021 (ref 1.005–1.030)

## 2018-04-23 LAB — CBC
HCT: 44.6 % (ref 36.0–46.0)
Hemoglobin: 15 g/dL (ref 12.0–15.0)
MCH: 29.6 pg (ref 26.0–34.0)
MCHC: 33.6 g/dL (ref 30.0–36.0)
MCV: 88.1 fL (ref 78.0–100.0)
PLATELETS: 412 10*3/uL — AB (ref 150–400)
RBC: 5.06 MIL/uL (ref 3.87–5.11)
RDW: 14.8 % (ref 11.5–15.5)
WBC: 10.9 10*3/uL — ABNORMAL HIGH (ref 4.0–10.5)

## 2018-04-23 LAB — HEMOGLOBIN A1C
HEMOGLOBIN A1C: 8.5 % — AB (ref 4.8–5.6)
Mean Plasma Glucose: 197.25 mg/dL

## 2018-04-23 LAB — GLUCOSE, CAPILLARY: GLUCOSE-CAPILLARY: 157 mg/dL — AB (ref 70–99)

## 2018-04-23 LAB — PREGNANCY, URINE: Preg Test, Ur: NEGATIVE

## 2018-04-23 LAB — ABO/RH: ABO/RH(D): A POS

## 2018-04-23 NOTE — Anesthesia Preprocedure Evaluation (Addendum)
Anesthesia Evaluation  Patient identified by MRN, date of birth, ID band Patient awake    Reviewed: Allergy & Precautions, NPO status , Patient's Chart, lab work & pertinent test results  History of Anesthesia Complications (+) PONV and history of anesthetic complications  Airway Mallampati: II  TM Distance: >3 FB Neck ROM: Full    Dental no notable dental hx. (+) Teeth Intact, Dental Advisory Given   Pulmonary    Pulmonary exam normal breath sounds clear to auscultation       Cardiovascular Exercise Tolerance: Good hypertension, Pt. on medications Normal cardiovascular exam Rhythm:Regular Rate:Normal     Neuro/Psych negative neurological ROS  negative psych ROS   GI/Hepatic Neg liver ROS, GERD  ,  Endo/Other  diabetes, Type 2  Renal/GU negative Renal ROS     Musculoskeletal  (+) Arthritis ,   Abdominal (+) + obese,   Peds  Hematology negative hematology ROS (+)   Anesthesia Other Findings   Reproductive/Obstetrics negative OB ROS                            Lab Results  Component Value Date   WBC 10.9 (H) 04/23/2018   HGB 15.0 04/23/2018   HCT 44.6 04/23/2018   MCV 88.1 04/23/2018   PLT 412 (H) 04/23/2018    Anesthesia Physical Anesthesia Plan  ASA: III  Anesthesia Plan: General   Post-op Pain Management:    Induction: Intravenous  PONV Risk Score and Plan: Treatment may vary due to age or medical condition, Dexamethasone and Ondansetron  Airway Management Planned: Oral ETT  Additional Equipment:   Intra-op Plan:   Post-operative Plan: Extubation in OR  Informed Consent: I have reviewed the patients History and Physical, chart, labs and discussed the procedure including the risks, benefits and alternatives for the proposed anesthesia with the patient or authorized representative who has indicated his/her understanding and acceptance.   Dental advisory  given  Plan Discussed with: CRNA  Anesthesia Plan Comments:         Anesthesia Quick Evaluation

## 2018-04-24 ENCOUNTER — Ambulatory Visit (HOSPITAL_COMMUNITY)
Admission: RE | Admit: 2018-04-24 | Discharge: 2018-04-25 | Disposition: A | Discharge status: Home / Self Care | Payer: BLUE CROSS/BLUE SHIELD | Source: Ambulatory Visit | Attending: Obstetrics | Admitting: Obstetrics

## 2018-04-24 ENCOUNTER — Ambulatory Visit (HOSPITAL_COMMUNITY): Payer: BLUE CROSS/BLUE SHIELD | Admitting: Anesthesiology

## 2018-04-24 ENCOUNTER — Encounter (HOSPITAL_COMMUNITY)
Admission: RE | Disposition: A | Discharge status: Home / Self Care | Payer: Self-pay | Source: Ambulatory Visit | Attending: Obstetrics

## 2018-04-24 ENCOUNTER — Encounter (HOSPITAL_COMMUNITY): Payer: Self-pay | Admitting: *Deleted

## 2018-04-24 ENCOUNTER — Other Ambulatory Visit: Payer: Self-pay

## 2018-04-24 DIAGNOSIS — K660 Peritoneal adhesions (postprocedural) (postinfection): Secondary | ICD-10-CM | POA: Insufficient documentation

## 2018-04-24 DIAGNOSIS — I10 Essential (primary) hypertension: Secondary | ICD-10-CM | POA: Diagnosis not present

## 2018-04-24 DIAGNOSIS — E119 Type 2 diabetes mellitus without complications: Secondary | ICD-10-CM | POA: Insufficient documentation

## 2018-04-24 DIAGNOSIS — Z79899 Other long term (current) drug therapy: Secondary | ICD-10-CM | POA: Insufficient documentation

## 2018-04-24 DIAGNOSIS — C541 Malignant neoplasm of endometrium: Secondary | ICD-10-CM | POA: Diagnosis present

## 2018-04-24 DIAGNOSIS — C786 Secondary malignant neoplasm of retroperitoneum and peritoneum: Secondary | ICD-10-CM | POA: Insufficient documentation

## 2018-04-24 DIAGNOSIS — Z794 Long term (current) use of insulin: Secondary | ICD-10-CM | POA: Diagnosis not present

## 2018-04-24 DIAGNOSIS — C7961 Secondary malignant neoplasm of right ovary: Secondary | ICD-10-CM | POA: Insufficient documentation

## 2018-04-24 DIAGNOSIS — C7982 Secondary malignant neoplasm of genital organs: Secondary | ICD-10-CM | POA: Insufficient documentation

## 2018-04-24 HISTORY — PX: ROBOTIC ASSISTED TOTAL HYSTERECTOMY WITH BILATERAL SALPINGO OOPHERECTOMY: SHX6086

## 2018-04-24 LAB — TYPE AND SCREEN
ABO/RH(D): A POS
Antibody Screen: NEGATIVE

## 2018-04-24 LAB — GLUCOSE, CAPILLARY
Glucose-Capillary: 166 mg/dL — ABNORMAL HIGH (ref 70–99)
Glucose-Capillary: 210 mg/dL — ABNORMAL HIGH (ref 70–99)
Glucose-Capillary: 240 mg/dL — ABNORMAL HIGH (ref 70–99)

## 2018-04-24 LAB — CA 125: Cancer Antigen (CA) 125: 73.2 U/mL — ABNORMAL HIGH (ref 0.0–38.1)

## 2018-04-24 SURGERY — HYSTERECTOMY, TOTAL, ROBOT-ASSISTED, LAPAROSCOPIC, WITH BILATERAL SALPINGO-OOPHORECTOMY
Anesthesia: General | Site: Abdomen

## 2018-04-24 MED ORDER — CEFAZOLIN SODIUM-DEXTROSE 2-4 GM/100ML-% IV SOLN
2.0000 g | INTRAVENOUS | Status: AC
  Administered 2018-04-24: 2 g via INTRAVENOUS
  Filled 2018-04-24: qty 100

## 2018-04-24 MED ORDER — DICLOFENAC SODIUM 50 MG PO TBEC
50.0000 mg | DELAYED_RELEASE_TABLET | Freq: Three times a day (TID) | ORAL | Status: DC | PRN
  Filled 2018-04-24: qty 1

## 2018-04-24 MED ORDER — INSULIN GLARGINE 100 UNIT/ML ~~LOC~~ SOLN
13.0000 [IU] | Freq: Every day | SUBCUTANEOUS | Status: DC
  Administered 2018-04-25: 13 [IU] via SUBCUTANEOUS
  Filled 2018-04-24: qty 0.13

## 2018-04-24 MED ORDER — INSULIN ASPART 100 UNIT/ML ~~LOC~~ SOLN
SUBCUTANEOUS | Status: AC
  Administered 2018-04-24: 4 [IU] via SUBCUTANEOUS
  Filled 2018-04-24: qty 1

## 2018-04-24 MED ORDER — SUGAMMADEX SODIUM 200 MG/2ML IV SOLN
INTRAVENOUS | Status: AC
  Filled 2018-04-24: qty 2

## 2018-04-24 MED ORDER — SODIUM CHLORIDE 0.9 % IR SOLN
Status: DC | PRN
  Administered 2018-04-24: 1000 mL

## 2018-04-24 MED ORDER — LIDOCAINE 2% (20 MG/ML) 5 ML SYRINGE
INTRAMUSCULAR | Status: AC
  Filled 2018-04-24: qty 10

## 2018-04-24 MED ORDER — ACETAMINOPHEN 500 MG PO TABS
1000.0000 mg | ORAL_TABLET | ORAL | Status: AC
  Administered 2018-04-24: 1000 mg via ORAL
  Filled 2018-04-24: qty 2

## 2018-04-24 MED ORDER — HYDROCODONE-ACETAMINOPHEN 7.5-325 MG PO TABS: 1.0000 | ORAL_TABLET | Freq: Once | ORAL | Status: DC | PRN

## 2018-04-24 MED ORDER — ACETAMINOPHEN 10 MG/ML IV SOLN: 1000.0000 mg | Freq: Once | INTRAVENOUS | Status: DC

## 2018-04-24 MED ORDER — GABAPENTIN 300 MG PO CAPS
300.0000 mg | ORAL_CAPSULE | ORAL | Status: AC
  Administered 2018-04-24: 300 mg via ORAL
  Filled 2018-04-24: qty 1

## 2018-04-24 MED ORDER — DEXMEDETOMIDINE HCL IN NACL 200 MCG/50ML IV SOLN
INTRAVENOUS | Status: DC | PRN
  Administered 2018-04-24 (×5): 8 ug via INTRAVENOUS

## 2018-04-24 MED ORDER — MIDAZOLAM HCL 2 MG/2ML IJ SOLN
INTRAMUSCULAR | Status: AC
  Filled 2018-04-24: qty 2

## 2018-04-24 MED ORDER — ROCURONIUM BROMIDE 10 MG/ML (PF) SYRINGE
PREFILLED_SYRINGE | INTRAVENOUS | Status: AC
  Filled 2018-04-24: qty 10

## 2018-04-24 MED ORDER — SCOPOLAMINE 1 MG/3DAYS TD PT72
1.0000 | MEDICATED_PATCH | TRANSDERMAL | Status: DC
  Administered 2018-04-24: 1.5 mg via TRANSDERMAL
  Filled 2018-04-24: qty 1

## 2018-04-24 MED ORDER — DEXAMETHASONE SODIUM PHOSPHATE 4 MG/ML IJ SOLN: 4.0000 mg | INTRAMUSCULAR | Status: DC

## 2018-04-24 MED ORDER — DEXAMETHASONE SODIUM PHOSPHATE 10 MG/ML IJ SOLN
INTRAMUSCULAR | Status: AC
  Filled 2018-04-24: qty 1

## 2018-04-24 MED ORDER — CELECOXIB 200 MG PO CAPS
400.0000 mg | ORAL_CAPSULE | ORAL | Status: AC
  Administered 2018-04-24: 400 mg via ORAL
  Filled 2018-04-24: qty 2

## 2018-04-24 MED ORDER — AMLODIPINE BESYLATE 5 MG PO TABS
5.0000 mg | ORAL_TABLET | Freq: Every day | ORAL | Status: DC
  Administered 2018-04-24: 5 mg via ORAL
  Filled 2018-04-24: qty 1

## 2018-04-24 MED ORDER — ACETAMINOPHEN 10 MG/ML IV SOLN: 1000.0000 mg | Freq: Once | INTRAVENOUS | Status: DC | PRN

## 2018-04-24 MED ORDER — ENOXAPARIN SODIUM 40 MG/0.4ML ~~LOC~~ SOLN
40.0000 mg | SUBCUTANEOUS | Status: DC
  Administered 2018-04-25: 40 mg via SUBCUTANEOUS
  Filled 2018-04-24: qty 0.4

## 2018-04-24 MED ORDER — FENTANYL CITRATE (PF) 250 MCG/5ML IJ SOLN
INTRAMUSCULAR | Status: AC
  Filled 2018-04-24: qty 5

## 2018-04-24 MED ORDER — LABETALOL HCL 5 MG/ML IV SOLN
INTRAVENOUS | Status: AC
  Administered 2018-04-24: 10 mg via INTRAVENOUS
  Filled 2018-04-24: qty 4

## 2018-04-24 MED ORDER — LIDOCAINE 2% (20 MG/ML) 5 ML SYRINGE
INTRAMUSCULAR | Status: DC | PRN
  Administered 2018-04-24: 60 mg via INTRAVENOUS

## 2018-04-24 MED ORDER — ONDANSETRON HCL 4 MG PO TABS: 4.0000 mg | ORAL_TABLET | Freq: Four times a day (QID) | ORAL | Status: DC | PRN

## 2018-04-24 MED ORDER — MIDAZOLAM HCL 5 MG/5ML IJ SOLN
INTRAMUSCULAR | Status: DC | PRN
  Administered 2018-04-24: 2 mg via INTRAVENOUS

## 2018-04-24 MED ORDER — LABETALOL HCL 5 MG/ML IV SOLN
10.0000 mg | Freq: Once | INTRAVENOUS | Status: AC
  Administered 2018-04-24: 10 mg via INTRAVENOUS

## 2018-04-24 MED ORDER — DEXMEDETOMIDINE HCL IN NACL 200 MCG/50ML IV SOLN
INTRAVENOUS | Status: AC
  Filled 2018-04-24: qty 50

## 2018-04-24 MED ORDER — ONDANSETRON HCL 4 MG/2ML IJ SOLN
4.0000 mg | Freq: Four times a day (QID) | INTRAMUSCULAR | Status: DC | PRN
  Administered 2018-04-24 – 2018-04-25 (×2): 4 mg via INTRAVENOUS
  Filled 2018-04-24 (×2): qty 2

## 2018-04-24 MED ORDER — PROMETHAZINE HCL 25 MG/ML IJ SOLN: 6.2500 mg | INTRAMUSCULAR | Status: DC | PRN

## 2018-04-24 MED ORDER — INSULIN ASPART 100 UNIT/ML ~~LOC~~ SOLN
0.0000 [IU] | Freq: Three times a day (TID) | SUBCUTANEOUS | Status: DC
  Administered 2018-04-25: 3 [IU] via SUBCUTANEOUS

## 2018-04-24 MED ORDER — INSULIN ASPART 100 UNIT/ML ~~LOC~~ SOLN
4.0000 [IU] | Freq: Once | SUBCUTANEOUS | Status: AC
  Administered 2018-04-24: 4 [IU] via SUBCUTANEOUS

## 2018-04-24 MED ORDER — LACTATED RINGERS IV SOLN
INTRAVENOUS | Status: DC
  Administered 2018-04-24 (×3): via INTRAVENOUS

## 2018-04-24 MED ORDER — CANAGLIFLOZIN 100 MG PO TABS
100.0000 mg | ORAL_TABLET | Freq: Every day | ORAL | Status: DC
  Administered 2018-04-25: 100 mg via ORAL
  Filled 2018-04-24: qty 1

## 2018-04-24 MED ORDER — METFORMIN HCL 500 MG PO TABS
1000.0000 mg | ORAL_TABLET | Freq: Two times a day (BID) | ORAL | Status: DC
  Administered 2018-04-25: 1000 mg via ORAL
  Filled 2018-04-24: qty 2

## 2018-04-24 MED ORDER — MEPERIDINE HCL 50 MG/ML IJ SOLN: 6.2500 mg | INTRAMUSCULAR | Status: DC | PRN

## 2018-04-24 MED ORDER — FENTANYL CITRATE (PF) 250 MCG/5ML IJ SOLN
INTRAMUSCULAR | Status: DC | PRN
  Administered 2018-04-24: 50 ug via INTRAVENOUS
  Administered 2018-04-24: 100 ug via INTRAVENOUS
  Administered 2018-04-24 (×2): 50 ug via INTRAVENOUS

## 2018-04-24 MED ORDER — SUGAMMADEX SODIUM 200 MG/2ML IV SOLN
INTRAVENOUS | Status: DC | PRN
  Administered 2018-04-24: 200 mg via INTRAVENOUS

## 2018-04-24 MED ORDER — ROCURONIUM BROMIDE 50 MG/5ML IV SOSY
PREFILLED_SYRINGE | INTRAVENOUS | Status: DC | PRN
  Administered 2018-04-24: 20 mg via INTRAVENOUS
  Administered 2018-04-24: 50 mg via INTRAVENOUS

## 2018-04-24 MED ORDER — LISINOPRIL 20 MG PO TABS
20.0000 mg | ORAL_TABLET | Freq: Every day | ORAL | Status: DC
  Administered 2018-04-24: 20 mg via ORAL
  Filled 2018-04-24: qty 1

## 2018-04-24 MED ORDER — ONDANSETRON HCL 4 MG/2ML IJ SOLN
INTRAMUSCULAR | Status: AC
  Filled 2018-04-24: qty 2

## 2018-04-24 MED ORDER — STERILE WATER FOR IRRIGATION IR SOLN
Status: DC | PRN
  Administered 2018-04-24: 1000 mL

## 2018-04-24 MED ORDER — 0.9 % SODIUM CHLORIDE (POUR BTL) OPTIME
TOPICAL | Status: DC | PRN
  Administered 2018-04-24: 1000 mL

## 2018-04-24 MED ORDER — HYDROMORPHONE HCL 1 MG/ML IJ SOLN
INTRAMUSCULAR | Status: AC
  Administered 2018-04-24: 0.25 mg via INTRAVENOUS
  Filled 2018-04-24: qty 1

## 2018-04-24 MED ORDER — OXYCODONE-ACETAMINOPHEN 5-325 MG PO TABS
1.0000 | ORAL_TABLET | ORAL | Status: DC | PRN
  Administered 2018-04-24: 1 via ORAL
  Filled 2018-04-24: qty 1

## 2018-04-24 MED ORDER — PROPOFOL 10 MG/ML IV BOLUS
INTRAVENOUS | Status: DC | PRN
  Administered 2018-04-24: 50 mg via INTRAVENOUS
  Administered 2018-04-24: 150 mg via INTRAVENOUS

## 2018-04-24 MED ORDER — ONDANSETRON HCL 4 MG/2ML IJ SOLN
INTRAMUSCULAR | Status: DC | PRN
  Administered 2018-04-24: 4 mg via INTRAVENOUS

## 2018-04-24 MED ORDER — DEXAMETHASONE SODIUM PHOSPHATE 4 MG/ML IJ SOLN
INTRAMUSCULAR | Status: DC | PRN
  Administered 2018-04-24: 10 mg via INTRAVENOUS

## 2018-04-24 MED ORDER — HYDROMORPHONE HCL 1 MG/ML IJ SOLN
0.2500 mg | INTRAMUSCULAR | Status: DC | PRN
  Administered 2018-04-24: 0.25 mg via INTRAVENOUS

## 2018-04-24 SURGICAL SUPPLY — 68 items
APPLICATOR COTTON TIP 6 STRL (MISCELLANEOUS) ×1 IMPLANT
APPLICATOR COTTON TIP 6IN STRL (MISCELLANEOUS) ×3
APPLICATOR SURGIFLO ENDO (HEMOSTASIS) IMPLANT
BAG LAPAROSCOPIC 12 15 PORT 16 (BASKET) IMPLANT
BAG RETRIEVAL 12/15 (BASKET)
BAG RETRIEVAL 12/15MM (BASKET)
BENZOIN TINCTURE PRP APPL 2/3 (GAUZE/BANDAGES/DRESSINGS) ×3 IMPLANT
CLOSURE WOUND 1/2 X4 (GAUZE/BANDAGES/DRESSINGS) ×1
COVER BACK TABLE 60X90IN (DRAPES) ×3 IMPLANT
COVER TIP SHEARS 8 DVNC (MISCELLANEOUS) ×1 IMPLANT
COVER TIP SHEARS 8MM DA VINCI (MISCELLANEOUS) ×2
DRAPE ARM DVNC X/XI (DISPOSABLE) ×4 IMPLANT
DRAPE COLUMN DVNC XI (DISPOSABLE) ×1 IMPLANT
DRAPE DA VINCI XI ARM (DISPOSABLE) ×8
DRAPE DA VINCI XI COLUMN (DISPOSABLE) ×2
DRAPE SHEET LG 3/4 BI-LAMINATE (DRAPES) ×6 IMPLANT
DRAPE SURG IRRIG POUCH 19X23 (DRAPES) ×3 IMPLANT
DRAPE UNDERBUTTOCKS STRL (DRAPE) ×3 IMPLANT
ELECT REM PT RETURN 15FT ADLT (MISCELLANEOUS) ×3 IMPLANT
GLOVE BIO SURGEON STRL SZ 6 (GLOVE) IMPLANT
GLOVE BIO SURGEON STRL SZ 6.5 (GLOVE) IMPLANT
GLOVE BIO SURGEONS STRL SZ 6.5 (GLOVE)
GLOVE BIOGEL PI IND STRL 7.0 (GLOVE) ×3 IMPLANT
GLOVE BIOGEL PI INDICATOR 7.0 (GLOVE) ×6
GLOVE SURG SS PI 6.5 STRL IVOR (GLOVE) ×9 IMPLANT
GOWN STRL REUS W/ TWL LRG LVL3 (GOWN DISPOSABLE) ×1 IMPLANT
GOWN STRL REUS W/TWL LRG LVL3 (GOWN DISPOSABLE) ×2
GOWN STRL REUS W/TWL XL LVL3 (GOWN DISPOSABLE) ×6 IMPLANT
GYRUS RUMI II 2.5CM BLUE (DISPOSABLE)
GYRUS RUMI II 3.5CM BLUE (DISPOSABLE)
HOLDER FOLEY CATH W/STRAP (MISCELLANEOUS) ×3 IMPLANT
IRRIG SUCT STRYKERFLOW 2 WTIP (MISCELLANEOUS) ×3
IRRIGATION SUCT STRKRFLW 2 WTP (MISCELLANEOUS) ×1 IMPLANT
KIT PROCEDURE DA VINCI SI (MISCELLANEOUS)
KIT PROCEDURE DVNC SI (MISCELLANEOUS) IMPLANT
MANIPULATOR UTERINE 4.5 ZUMI (MISCELLANEOUS) IMPLANT
NEEDLE SPNL 18GX3.5 QUINCKE PK (NEEDLE) IMPLANT
OBTURATOR OPTICAL STANDARD 8MM (TROCAR) ×2
OBTURATOR OPTICAL STND 8 DVNC (TROCAR) ×1
OBTURATOR OPTICALSTD 8 DVNC (TROCAR) ×1 IMPLANT
PACK ROBOT GYN CUSTOM WL (TRAY / TRAY PROCEDURE) ×3 IMPLANT
PAD POSITIONING PINK XL (MISCELLANEOUS) ×3 IMPLANT
PORT ACCESS TROCAR AIRSEAL 12 (TROCAR) ×1 IMPLANT
PORT ACCESS TROCAR AIRSEAL 5M (TROCAR) ×2
POUCH SPECIMEN RETRIEVAL 10MM (ENDOMECHANICALS) IMPLANT
RUMI II 3.0CM BLUE KOH-EFFICIE (DISPOSABLE) ×3 IMPLANT
RUMI II GYRUS 2.5CM BLUE (DISPOSABLE) IMPLANT
RUMI II GYRUS 3.5CM BLUE (DISPOSABLE) IMPLANT
SEAL CANN UNIV 5-8 DVNC XI (MISCELLANEOUS) ×4 IMPLANT
SEAL XI 5MM-8MM UNIVERSAL (MISCELLANEOUS) ×8
SET TRI-LUMEN FLTR TB AIRSEAL (TUBING) ×3 IMPLANT
STRIP CLOSURE SKIN 1/2X4 (GAUZE/BANDAGES/DRESSINGS) ×2 IMPLANT
SURGIFLO W/THROMBIN 8M KIT (HEMOSTASIS) IMPLANT
SUT VIC AB 0 CT1 27 (SUTURE)
SUT VIC AB 0 CT1 27XBRD ANTBC (SUTURE) IMPLANT
SUT VIC AB 4-0 P2 18 (SUTURE) ×6 IMPLANT
SUT VLOC 180 0 9IN  GS21 (SUTURE) ×4
SUT VLOC 180 0 9IN GS21 (SUTURE) ×2 IMPLANT
SYR 10ML LL (SYRINGE) IMPLANT
TIP RUMI ORANGE 6.7MMX12CM (TIP) IMPLANT
TIP UTERINE 5.1X6CM LAV DISP (MISCELLANEOUS) IMPLANT
TIP UTERINE 6.7X10CM GRN DISP (MISCELLANEOUS) IMPLANT
TIP UTERINE 6.7X6CM WHT DISP (MISCELLANEOUS) IMPLANT
TIP UTERINE 6.7X8CM BLUE DISP (MISCELLANEOUS) ×3 IMPLANT
TOWEL OR NON WOVEN STRL DISP B (DISPOSABLE) ×3 IMPLANT
TRAP SPECIMEN MUCOUS 40CC (MISCELLANEOUS) ×3 IMPLANT
TRAY FOLEY MTR SLVR 16FR STAT (SET/KITS/TRAYS/PACK) ×3 IMPLANT
UNDERPAD 30X30 (UNDERPADS AND DIAPERS) ×3 IMPLANT

## 2018-04-24 NOTE — Interval H&P Note (Signed)
History and Physical Interval Note:  04/24/2018 3:30 PM  Kathryn Stewart  has presented today for surgery, with the diagnosis of endometral cancer  The various methods of treatment have been discussed with the patient and family. After consideration of risks, benefits and other options for treatment, the patient has consented to  Procedure(s): XI ROBOTIC ASSISTED TOTAL HYSTERECTOMY WITH UNILATERAL SALPINGO OOPHORECTOMY WITH OMENTECTOMY, POSSIBLE LAPAROTOMY (N/A) as a surgical intervention .  The patient's history has been reviewed, patient examined, no change in status, stable for surgery.  I have reviewed the patient's chart and labs.  Questions were answered to the patient's satisfaction.     [name]

## 2018-04-24 NOTE — Anesthesia Procedure Notes (Signed)
Procedure Name: Intubation Date/Time: 04/24/2018 4:14 PM Performed by: Talbot Grumbling, CRNA Pre-anesthesia Checklist: Patient identified, Emergency Drugs available, Suction available and Patient being monitored Patient Re-evaluated:Patient Re-evaluated prior to induction Oxygen Delivery Method: Circle system utilized Preoxygenation: Pre-oxygenation with 100% oxygen Induction Type: IV induction Ventilation: Mask ventilation without difficulty Laryngoscope Size: Mac and 3 Grade View: Grade I Tube type: Oral Tube size: 7.0 mm Number of attempts: 1 Airway Equipment and Method: Stylet Placement Confirmation: ETT inserted through vocal cords under direct vision,  positive ETCO2 and breath sounds checked- equal and bilateral Secured at: 21 cm Tube secured with: Tape Dental Injury: Teeth and Oropharynx as per pre-operative assessment

## 2018-04-24 NOTE — Transfer of Care (Signed)
Immediate Anesthesia Transfer of Care Note  Patient: Kathryn Stewart  Procedure(s) Performed: XI ROBOTIC  DIAGNOSTIC LAPAROSCOPY WITH BIOPSY. (N/A Abdomen)  Patient Location: PACU  Anesthesia Type:General  Level of Consciousness: sedated  Airway & Oxygen Therapy: Patient Spontanous Breathing and Patient connected to face mask oxygen  Post-op Assessment: Report given to RN and Post -op Vital signs reviewed and stable  Post vital signs: Reviewed and stable  Last Vitals:  Vitals Value Taken Time  BP    Temp    Pulse 107 04/24/2018  6:29 PM  Resp    SpO2 100 % 04/24/2018  6:29 PM  Vitals shown include unvalidated device data.  Last Pain:  Vitals:   04/24/18 1047  TempSrc: Oral         Complications: No apparent anesthesia complications

## 2018-04-24 NOTE — Op Note (Signed)
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 Biopsy of left parametrial tissue and sigmoid peritoneum  Surgeon: Bernita Raisin, MD  Assistant Surgeon: Lahoma Crocker M.D. (an MD assistant was necessary for tissue manipulation, management of robotic instrumentation, retraction and positioning due to the complexity of the case and hospital policies).   Anesthesia: GETA  Specimens: pelvic washings, parametrial biopsy, sigmoid peritoneum biopsy  Complications: none  Indication for Procedure:  Patient found at OSH to have right ovarian mass that was resected and found on permanent section to be c/w endometrial cancer. Also had endometrial biopsy showing the same.  Operative Findings: Significant adhesive disease almost miliary-like. The sigmoid was adherent to the left cornua. After takedown of the sigmoid in this region it was clear the left cornua was involved in an abnormal process including necrotic tissue clinically concerning for malignancy. In addition the bladder was adherent to this region. Posterior surface of the broad ligament in area c/w the parametria on the left showed likely tumor breakthrough. Images were taken. Biopsy from the tissue extruding from the left side of the uterus in the presumed parametrium was sent for frozen. This returned worrisome for disease, but not definitive. A separate area of the sigmoid was adherent inferior to the posterior surface lesion and the remnant of this adhesion was sent as sigmoid peritoneum. This, on frozen, returned with fibrosis.   Procedure in Detail:  The patient was positioned supine with her arms at her sides prior to anesthesia to  ensure comfort. The patient was taken to the operating room and placed under general endotracheal anesthesia without difficulty. She was placed in a dorsolithotomy position and cervical acromial pads were placed. The patient had sequential compression devices for VTE prophylaxis.  The patient was then prepped in the usual sterile fashion.  Time out was performed.   A foley was placed by me. The RUMI II was then placed with a 3.0 ring and an 8cm manipulator.   A 80mm incision was made in the left upper quadrant palmer's point and a 5 mm Optiview trocar used to enter the abdomen under direct visualization. With entry into the abdomen and then maintenance of 15 mm of mercury the patient was placed in Trendelenburg position. What was anticipated to be simple adhesive disease was seen in the pelvis. An incision was made inferior to the umbilicus and used for an 94mm trocar. To the right of this 2 additional 51mm trocars were placed approximately 6-8 cm from the closest trocar. An 62mm trocar was also placed in the left lateral abdominal wall. 8 mm robotic trochars were inserted. The 41mm LUQ trocar was changed out to 30mm airseal. Washings were obtained. The robot was docked.  A Raytec was placed on the left pelvic brim and the small bowel folded gently above the pelvic brim.  Findings as noted. Lysis of adhesions performed taking down the sigmoid colon from the left cornua and the left pelvis. The concerning area was further examined. The retroperitoneum on the left was opened and the ureter identified. The presumed left round was isolated, clamped, coagulated, and transected. The area of concern extruded necrotic tissue concerning for malignancy. After biopsy of the main areas of concern the disease was felt to be too  advanced for a simple hysterectomy and the risk of tumor cut-through and visceral organ injury was too high. Therefore irrigation was used. Hemostasis was noted.   The robot was undocked. The  Raytec removed. The Airseal was shutdown and then all ports removed. The fascial closure at the left upper quadrant port was performed with 0-Vicryl in the usual fashion.  All incisions were closed with interrupted 4-0 vicryl and running subcuticular 4-0 Monocryl suture. Dermabond was applied.  The RUMI II and the foley were removed by me.  Sponge, lap and needle counts were correct x 2          Disposition: PACU -Stable

## 2018-04-24 NOTE — Anesthesia Postprocedure Evaluation (Signed)
Anesthesia Post Note  Patient: Kathryn Stewart  Procedure(s) Performed: XI ROBOTIC  DIAGNOSTIC LAPAROSCOPY WITH BIOPSY. (N/A Abdomen)     Patient location during evaluation: PACU Anesthesia Type: General Level of consciousness: awake and alert Pain management: pain level controlled Vital Signs Assessment: post-procedure vital signs reviewed and stable Respiratory status: spontaneous breathing, nonlabored ventilation, respiratory function stable and patient connected to nasal cannula oxygen Cardiovascular status: blood pressure returned to baseline and stable Postop Assessment: no apparent nausea or vomiting Anesthetic complications: no    Last Vitals:  Vitals:   04/24/18 1937 04/24/18 2050  BP: (!) 152/98 124/89  Pulse: 91 96  Resp: 18 14  Temp: 36.6 C 36.5 C  SpO2: 96% 98%    Last Pain:  Vitals:   04/24/18 2050  TempSrc: Oral  PainSc:                  [place]

## 2018-04-25 ENCOUNTER — Encounter (HOSPITAL_COMMUNITY): Payer: Self-pay | Admitting: Obstetrics

## 2018-04-25 ENCOUNTER — Encounter: Payer: Self-pay | Admitting: Oncology

## 2018-04-25 DIAGNOSIS — C541 Malignant neoplasm of endometrium: Secondary | ICD-10-CM | POA: Diagnosis not present

## 2018-04-25 LAB — GLUCOSE, CAPILLARY: GLUCOSE-CAPILLARY: 240 mg/dL — AB (ref 70–99)

## 2018-04-25 NOTE — Progress Notes (Signed)
Patient advised of appointment with Dr. Alvy Bimler on 04/26/18 at 3:15. Discussed valet parking and check in process.  Patient verbalized understanding and agreement.

## 2018-04-25 NOTE — Discharge Instructions (Signed)
04/25/2018  Return to work: 4-6 weeks if applicable  Activity: 1. Be up and out of the bed during the day.  Take a nap if needed.  You may walk up steps but be careful and use the hand rail.  Stair climbing will tire you more than you think, you may need to stop part way and rest.   2. No lifting or straining for 6 weeks.  3. No driving for 1 week(s).  Do not drive if you are taking narcotic pain medicine.  4. Shower daily.  Use soap and water on your incision and pat dry; don't rub.  No tub baths until cleared by your surgeon.   5. No sexual activity and nothing in the vagina for 4-6 weeks.  6. You may experience a small amount of clear drainage from your incisions, which is normal.  If the drainage persists or increases, please call the office.  7. Take Tylenol or ibuprofen first for pain and only use narcotic pain medication for severe pain not relieved by the Tylenol or Ibuprofen.  Monitor your Tylenol intake to a max of 4,000 mg a day.  Diet: 1. Low sodium Heart Healthy Diet is recommended.  2. It is safe to use a laxative, such as Miralax or Colace, if you have difficulty moving your bowels. You can take Sennakot at bedtime every evening to keep bowel movements regular and to prevent constipation.    Wound Care: 1. Keep clean and dry.  Shower daily.  Reasons to call the Doctor:  Fever - Oral temperature greater than 100.4 degrees Fahrenheit  Foul-smelling vaginal discharge  Difficulty urinating  Nausea and vomiting  Increased pain at the site of the incision that is unrelieved with pain medicine.  Difficulty breathing with or without chest pain  New calf pain especially if only on one side  Sudden, continuing increased vaginal bleeding with or without clots.   Contacts: For questions or concerns you should contact:  Dr. Precious Haws at [place], NP at [phone]  After Hours: call [phone] and have the GYN Oncologist  paged/contacted

## 2018-04-25 NOTE — Discharge Summary (Signed)
Physician Discharge Summary  Patient ID: Kathryn Stewart MRN: [id number] DOB/AGE: [birth year] 37 y.o.  Admit date: 04/24/2018 Discharge date: 04/25/2018  Admission Diagnoses: Endometrial cancer [facility])  Discharge Diagnoses:  Principal Problem:   Endometrial cancer [facility])   Discharged Condition:  The patient is in good condition and stable for discharge.  Hospital Course: On 04/24/2018, the patient underwent the following: Procedure(s): XI ROBOTIC  DIAGNOSTIC LAPAROSCOPY WITH BIOPSY.  The postoperative course was uneventful.  She was discharged to home on postoperative day 1 tolerating a regular diet, ambulating, voiding, minimal pain.  She is going to see Dr. Alvy Bimler with Medical Oncology tomorrow afternoon to discuss chemotherapy.  She states she still has tramadol at home from previous surgery and does not need a new script.  She is advised to resume her diabetic medications and to monitor her blood sugar closely.  Consults: None  Significant Diagnostic Studies: None  Treatments: surgery: see above  Discharge Exam: Blood pressure 106/75, pulse 89, temperature 98.2 F (36.8 C), temperature source Oral, resp. rate 18, height 5' 1.5" (1.562 m), weight 167 lb 3.2 oz (75.8 kg), SpO2 98 %. General appearance: alert, cooperative and no distress Resp: clear to auscultation bilaterally Cardio: regular rate and rhythm, S1, S2 normal, no murmur, click, rub or gallop GI: soft, non-tender; bowel sounds normal; no masses,  no organomegaly Extremities: extremities normal, atraumatic, no cyanosis or edema Incision/Wound: Lap sites to the abdomen with dermabond without drainage  Disposition: Discharge disposition: 01-Home or Self Care       Discharge Instructions    Call MD for:  difficulty breathing, headache or visual disturbances   Complete by:  As directed    Call MD for:  extreme fatigue   Complete by:  As directed    Call MD for:  hives   Complete by:  As directed    Call MD for:   persistant dizziness or light-headedness   Complete by:  As directed    Call MD for:  persistant nausea and vomiting   Complete by:  As directed    Call MD for:  redness, tenderness, or signs of infection (pain, swelling, redness, odor or green/yellow discharge around incision site)   Complete by:  As directed    Call MD for:  severe uncontrolled pain   Complete by:  As directed    Call MD for:  temperature >100.4   Complete by:  As directed    Diet - low sodium heart healthy   Complete by:  As directed    Driving Restrictions   Complete by:  As directed    No driving for 1 week.  Do not take narcotics and drive.   Increase activity slowly   Complete by:  As directed    Lifting restrictions   Complete by:  As directed    No lifting greater than 10 lbs.   Sexual Activity Restrictions   Complete by:  As directed    No sexual activity, nothing in the vagina, for 4-6 weeks.     Allergies as of 04/25/2018   No Known Allergies     Medication List    TAKE these medications   amLODipine 5 MG tablet Commonly known as:  NORVASC Take 5 mg by mouth at bedtime.   JARDIANCE 10 MG Tabs tablet Generic drug:  empagliflozin Take 10 mg by mouth daily.   lisinopril 20 MG tablet Commonly known as:  PRINIVIL,ZESTRIL Take 20 mg by mouth at bedtime.   metFORMIN 500 MG tablet  Commonly known as:  GLUCOPHAGE Take 1,000 mg by mouth 2 (two) times daily with a meal. 1 at lunch time and 1 at bedtime   TRESIBA FLEXTOUCH 100 UNIT/ML Sopn FlexTouch Pen Generic drug:  insulin degludec Inject 13 Units into the skin daily.      Follow-up Information    Heath Lark, MD Follow up on 04/26/2018.   Specialty:  Hematology and Oncology Why:  at 3:15pm at the [facility].  Please arrive thirty minutes early to check in and register for your appointment. Contact information: [place] [id number] [phone]           Greater than thirty minutes were spend for  face to face discharge instructions and discharge orders/summary in EPIC.   Signed: Dorothyann Gibbs 04/25/2018, 11:19 AM

## 2018-04-26 ENCOUNTER — Telehealth: Payer: Self-pay | Admitting: Hematology and Oncology

## 2018-04-26 ENCOUNTER — Encounter: Payer: Self-pay | Admitting: Hematology and Oncology

## 2018-04-26 ENCOUNTER — Inpatient Hospital Stay (HOSPITAL_BASED_OUTPATIENT_CLINIC_OR_DEPARTMENT_OTHER): Payer: BLUE CROSS/BLUE SHIELD | Admitting: Hematology and Oncology

## 2018-04-26 VITALS — BP 112/78 | HR 72 | Temp 98.1°F | Resp 18 | Ht 61.5 in | Wt 168.4 lb

## 2018-04-26 DIAGNOSIS — E119 Type 2 diabetes mellitus without complications: Secondary | ICD-10-CM

## 2018-04-26 DIAGNOSIS — R110 Nausea: Secondary | ICD-10-CM | POA: Diagnosis not present

## 2018-04-26 DIAGNOSIS — C541 Malignant neoplasm of endometrium: Secondary | ICD-10-CM

## 2018-04-26 DIAGNOSIS — I10 Essential (primary) hypertension: Secondary | ICD-10-CM

## 2018-04-26 DIAGNOSIS — Z5111 Encounter for antineoplastic chemotherapy: Secondary | ICD-10-CM | POA: Diagnosis present

## 2018-04-26 MED ORDER — PROCHLORPERAZINE MALEATE 10 MG PO TABS: 10.0000 mg | ORAL_TABLET | Freq: Four times a day (QID) | ORAL | 0 refills | Status: DC | PRN

## 2018-04-26 MED ORDER — ONDANSETRON HCL 8 MG PO TABS: 8.0000 mg | ORAL_TABLET | Freq: Three times a day (TID) | ORAL | 3 refills | Status: DC | PRN

## 2018-04-26 NOTE — Telephone Encounter (Signed)
Gave avs however waiting for approval for 8/30

## 2018-04-26 NOTE — Progress Notes (Signed)
START ON PATHWAY REGIMEN - Uterine     A cycle is every 21 days:     Paclitaxel      Carboplatin   **Always confirm dose/schedule in your pharmacy ordering system**  Patient Characteristics: Endometrioid Histology, Newly Diagnosed, Medically Inoperable, Clinical Stage IV Histology: Endometrioid Histology Therapeutic Status: Newly Diagnosed AJCC T Category: T4 AJCC N Category: N0 AJCC M Category: M0 AJCC 8 Stage Grouping: IVA Surgical Status: Medically Inoperable Intent of Therapy: Curative Intent, Discussed with Patient

## 2018-04-27 ENCOUNTER — Telehealth: Payer: Self-pay | Admitting: *Deleted

## 2018-04-27 DIAGNOSIS — E119 Type 2 diabetes mellitus without complications: Secondary | ICD-10-CM | POA: Insufficient documentation

## 2018-04-27 NOTE — Progress Notes (Signed)
[facility] CONSULT NOTE  Patient Care Team: Monico Blitz, MD as PCP - General (Internal Medicine)  ASSESSMENT & PLAN:  Endometrial cancer [facility]) I reviewed the plan of care with the patient and her husband Due to locally advanced disease, I recommend minimum 3 cycles of neoadjuvant chemotherapy with carboplatin and Taxol followed by repeat imaging studies If she has good response to therapy, she may proceed with interval debulking surgery I recommend port placement, chemo education class and blood work next week I plan to start her treatment as soon as possible since her wound is healing well   Nausea without vomiting She has some mild nausea likely related to recent surgery I have prescribed some antiemetics to take as needed  Diabetes mellitus without complication ([place]) She has poorly controlled diabetes We discussed the importance of close monitoring with her primary care doctor along with dietary modification as I anticipate severe hyperglycemia with steroid treatment while on chemotherapy. She would likely need additional insulin therapy while on treatment   Orders Placed This Encounter  Procedures  . IR IMAGING GUIDED PORT INSERTION    Standing Status:   Future    Standing Expiration Date:   06/27/2019    Order Specific Question:   Reason for Exam (SYMPTOM  OR DIAGNOSIS REQUIRED)    Answer:   need port for chemo    Order Specific Question:   Is the patient pregnant?    Answer:   No    Order Specific Question:   Preferred Imaging Location?    Answer:   [facility]    Order Specific Question:   Call Results- Best Contact Number?    Answer:   need port for chemo to start 8/30  . Comprehensive metabolic panel    Standing Status:   Standing    Number of Occurrences:   22    Standing Expiration Date:   04/28/2019  . CBC with Differential/Platelet    Standing Status:   Standing    Number of Occurrences:   22    Standing Expiration Date:   04/28/2019   . CA 125    Standing Status:   Standing    Number of Occurrences:   9    Standing Expiration Date:   04/28/2019     CHIEF COMPLAINTS/PURPOSE OF CONSULTATION:  Newly diagnosed uterine cancer, for further management  HISTORY OF PRESENTING ILLNESS:  Kathryn Stewart 37 y.o. female is here because of recent diagnosis of uterine cancer. Her husband, Delfino Lovett is present Her symptoms began due to acute abdominal pain.  She was seen initially in the emergency department locally leading to imaging study, surgery and subsequently referral to be evaluated here. I have reviewed her chart and materials related to her cancer extensively and collaborated history with the patient. Summary of oncologic history is as follows:   Endometrial cancer ([place])   04/06/2018 Initial Diagnosis    She presented with severe abdominal pain    04/07/2018 Imaging    She presented to the emergency room after the pain woke her up CT imaging was performed 04/07/2018 [facility].  A 6.5 x 7 x 8.1 complex right adnexal mass was found with solid and fat components consistent with a teratoma.  No lymphadenopathy.  Small to moderate ascites and inflammatory changes were seen in the pelvis follow-up ultrasound was also performed showing concern for right ovarian torsion with a large ovarian mass cystic and solid components 9.7 cm.  A left ovarian cyst seen 3.4  cm.  Endometrial thickening with hypervascularity and endometrial thickness of 24 mm moderate pelvic free fluid.    04/07/2018 Surgery    Given the concern for torsion she was taken urgently to the operating room by Dr. Orson Ape. McLeod 04/07/2018.  At that time operative laparoscopy, torsion of the right adnexa was noted along with thickened abnormal endometrium.  She had right salpingo-oophorectomy through a laparoscopic approach along with endometrial biopsy.      04/07/2018 Pathology Results    Right ovary showed adenocarcinoma, endometrioid type Endometrial biopsy showed  adenocarcinoma    04/23/2018 Tumor Marker    Patient's tumor was tested for the following markers: CA-125 Results of the tumor marker test revealed 73.2    04/24/2018 Surgery    Preoperative Diagnosis: At least Stage 3 Endometrial Cancer with metastases to right ovary   Postoperative Diagnosis:  At least Stage 3, possible Stage 4 Endometrial Cancer with metastases to right ovary and questionable parametrial and sigmoid peritoneum  Procedure(s) Performed: Pelvic washings, Lysis of adhesions. Biopsy of left parametrial tissue and sigmoid peritoneum  Surgeon: Bernita Raisin, MD  Specimens: pelvic washings, parametrial biopsy, sigmoid peritoneum biopsy  Complications: none  Indication for Procedure:  Patient found at OSH to have right ovarian mass that was resected and found on permanent section to be c/w endometrial cancer. Also had endometrial biopsy showing the same.  Operative Findings: Significant adhesive disease almost miliary-like. The sigmoid was adherent to the left cornua. After takedown of the sigmoid in this region it was clear the left cornua was involved in an abnormal process including necrotic tissue clinically concerning for malignancy. In addition the bladder was adherent to this region. Posterior surface of the broad ligament in area c/w the parametria on the left showed likely tumor breakthrough. Images were taken. Biopsy from the tissue extruding from the left side of the uterus in the presumed parametrium was sent for frozen. This returned worrisome for disease, but not definitive. A separate area of the sigmoid was adherent inferior to the posterior surface lesion and the remnant of this adhesion was sent as sigmoid peritoneum. This, on frozen, returned with fibrosis.     04/25/2018 Cancer Staging    Staging form: Corpus Uteri - Carcinoma and Carcinosarcoma, AJCC 8th Edition - Clinical: Stage IVA (cT4, cN0, cM0) - Signed by Heath Lark, MD on 04/25/2018    Since  surgery, her wound is healing well.  She has some mild nausea and vomiting but resolved without additional treatment.  She denies changes in bowel habits. She had no recent changes in weight. She denies history of abnormal Pap smear or abnormal bleeding. She does not desire to have any children. Her only comorbidities is poorly controlled diabetes, currently on insulin therapy.  She denies other diabetes related complications such as neuropathy, retinopathy or nephropathy  MEDICAL HISTORY:  Past Medical History:  Diagnosis Date  . Arthritis   . Asthma   . Complication of anesthesia   . 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 GERD (gastroesophageal reflux disease)   . Hypertension   . PONV (postoperative nausea and vomiting)     SURGICAL HISTORY: Past Surgical History:  Procedure Laterality Date  . LAPAROSCOPIC SALPINGOOPHERECTOMY Right 2019   torsion  . ROBOTIC ASSISTED TOTAL HYSTERECTOMY WITH BILATERAL SALPINGO OOPHERECTOMY N/A 04/24/2018   Procedure: XI ROBOTIC  DIAGNOSTIC LAPAROSCOPY WITH BIOPSY.;  Surgeon: [name], MD;  Location: [facility];  Service: Gynecology;  Laterality: N/A;  SOCIAL HISTORY: Social History   Socioeconomic History  . Marital status: Married    Spouse name: Richard  . Number of children: Not on file  . Years of education: Not on file  . Highest education level: Not on file  Occupational History  . Occupation: Freight forwarder  Social Needs  . Financial resource strain: Not on file  . Food insecurity:    Worry: Not on file    Inability: Not on file  . Transportation needs:    Medical: Not on file    Non-medical: Not on file  Tobacco Use  . Smoking status: Never Smoker  . Smokeless tobacco: Never Used  Substance and Sexual Activity  . Alcohol use: No  . Drug use: No  . Sexual activity: Not on file  Lifestyle  . Physical activity:    Days per week: Not on file    Minutes per session: Not on file  . Stress: Not  on file  Relationships  . Social connections:    Talks on phone: Not on file    Gets together: Not on file    Attends religious service: Not on file    Active member of club or organization: Not on file    Attends meetings of clubs or organizations: Not on file    Relationship status: Not on file  . Intimate partner violence:    Fear of current or ex partner: Not on file    Emotionally abused: Not on file    Physically abused: Not on file    Forced sexual activity: Not on file  Other Topics Concern  . Not on file  Social History Narrative  . Not on file    FAMILY HISTORY: Family History  Problem Relation Age of Onset  . Diabetes Mother   . Cervical cancer Mother   . Heart attack Father   . Diabetes Brother     ALLERGIES:  has No Known Allergies.  MEDICATIONS:  Current Outpatient Medications  Medication Sig Dispense Refill  . amLODipine (NORVASC) 5 MG tablet Take 5 mg by mouth at bedtime.     . empagliflozin (JARDIANCE) 10 MG TABS tablet Take 10 mg by mouth daily.     . insulin degludec (TRESIBA FLEXTOUCH) 100 UNIT/ML SOPN FlexTouch Pen Inject 13 Units into the skin daily.     Marland Kitchen lisinopril (PRINIVIL,ZESTRIL) 20 MG tablet Take 20 mg by mouth at bedtime.     . metFORMIN (GLUCOPHAGE) 500 MG tablet Take 1,000 mg by mouth 2 (two) times daily with a meal. 1 at lunch time and 1 at bedtime    . ondansetron (ZOFRAN) 8 MG tablet Take 1 tablet (8 mg total) by mouth every 8 (eight) hours as needed for nausea. 30 tablet 3  . prochlorperazine (COMPAZINE) 10 MG tablet Take 1 tablet (10 mg total) by mouth every 6 (six) hours as needed for nausea or vomiting. 30 tablet 0   No current facility-administered medications for this visit.     REVIEW OF SYSTEMS:   Constitutional: Denies fevers, chills or abnormal night sweats Eyes: Denies blurriness of vision, double vision or watery eyes Ears, nose, mouth, throat, and face: Denies mucositis or sore throat Respiratory: Denies cough, dyspnea  or wheezes Cardiovascular: Denies palpitation, chest discomfort or lower extremity swelling Skin: Denies abnormal skin rashes Lymphatics: Denies new lymphadenopathy or easy bruising Neurological:Denies numbness, tingling or new weaknesses Behavioral/Psych: Mood is stable, no new changes  All other systems were reviewed with the patient and are negative.  PHYSICAL EXAMINATION: ECOG PERFORMANCE STATUS: 1 - Symptomatic but completely ambulatory  Vitals:   04/26/18 1511  BP: 112/78  Pulse: 72  Resp: 18  Temp: 98.1 F (36.7 C)  SpO2: 100%   Filed Weights   04/26/18 1511  Weight: 168 lb 6.4 oz (76.4 kg)    GENERAL:alert, no distress and comfortable SKIN: skin color, texture, turgor are normal, no rashes or significant lesions EYES: normal, conjunctiva are pink and non-injected, sclera clear OROPHARYNX:no exudate, no erythema and lips, buccal mucosa, and tongue normal  NECK: supple, thyroid normal size, non-tender, without nodularity LYMPH:  no palpable lymphadenopathy in the cervical, axillary or inguinal LUNGS: clear to auscultation and percussion with normal breathing effort HEART: regular rate & rhythm and no murmurs and no lower extremity edema ABDOMEN:abdomen soft, non-tender and normal bowel sounds.  She has well-healed surgical scars Musculoskeletal:no cyanosis of digits and no clubbing  PSYCH: alert & oriented x 3 with fluent speech NEURO: no focal motor/sensory deficits  LABORATORY DATA:  I have reviewed the data as listed Lab Results  Component Value Date   WBC 10.9 (H) 04/23/2018   HGB 15.0 04/23/2018   HCT 44.6 04/23/2018   MCV 88.1 04/23/2018   PLT 412 (H) 04/23/2018   Recent Labs    04/23/18 0952  NA 140  K 4.8  CL 102  CO2 29  GLUCOSE 170*  BUN 31*  CREATININE 0.91  CALCIUM 10.2  GFRNONAA >60  GFRAA >60  PROT 7.9  ALBUMIN 4.1  AST 19  ALT 26  ALKPHOS 58  BILITOT 0.8    RADIOGRAPHIC STUDIES: I have personally reviewed the radiological  images as listed and agreed with the findings in the report. Dg Chest 2 View  Result Date: 04/23/2018 CLINICAL DATA:  Preoperative examination. Patient for hysterectomy secondary to endometrial carcinoma EXAM: CHEST - 2 VIEW COMPARISON:  None. FINDINGS: Lungs clear. Heart size normal. No pneumothorax or pleural effusion. No acute or focal bony abnormality. IMPRESSION: Negative chest. Electronically Signed   By: Inge Rise M.D.   On: 04/23/2018 10:13    I spent 40 minutes counseling the patient face to face. The total time spent in the appointment was 60 minutes and more than 50% was on counseling.  All questions were answered. The patient knows to call the clinic with any problems, questions or concerns.  Heath Lark, MD 04/27/2018 9:00 AM

## 2018-04-27 NOTE — Telephone Encounter (Signed)
Notified of port placement at [facility] on 9/3 @ 0700. NPO after MN and needs a driver. Pt verbalized understanding.  This was the 1st available appt for port.

## 2018-04-27 NOTE — Assessment & Plan Note (Signed)
She has some mild nausea likely related to recent surgery I have prescribed some antiemetics to take as needed

## 2018-04-27 NOTE — Assessment & Plan Note (Signed)
She has poorly controlled diabetes We discussed the importance of close monitoring with her primary care doctor along with dietary modification as I anticipate severe hyperglycemia with steroid treatment while on chemotherapy. She would likely need additional insulin therapy while on treatment

## 2018-04-27 NOTE — Assessment & Plan Note (Signed)
I reviewed the plan of care with the patient and her husband Due to locally advanced disease, I recommend minimum 3 cycles of neoadjuvant chemotherapy with carboplatin and Taxol followed by repeat imaging studies If she has good response to therapy, she may proceed with interval debulking surgery I recommend port placement, chemo education class and blood work next week I plan to start her treatment as soon as possible since her wound is healing well

## 2018-04-30 ENCOUNTER — Telehealth: Payer: Self-pay | Admitting: *Deleted

## 2018-04-30 ENCOUNTER — Telehealth: Payer: Self-pay

## 2018-04-30 NOTE — Telephone Encounter (Signed)
  Told Kathryn Stewart that [place] can fax the FMLA forms to Dr. Alvy Bimler at [phone]. The forms then will be given to A nurse named Marzetta Board to complete.

## 2018-04-30 NOTE — Telephone Encounter (Signed)
Would she consider going to [facility] for port?

## 2018-04-30 NOTE — Telephone Encounter (Signed)
Spoke with patient regarding having port placed earlier at [facility]. Pt would prefer to keep schedule the way it is and have port placed next week.

## 2018-05-02 ENCOUNTER — Telehealth: Payer: Self-pay | Admitting: Hematology and Oncology

## 2018-05-02 ENCOUNTER — Encounter: Payer: Self-pay | Admitting: Hematology and Oncology

## 2018-05-02 ENCOUNTER — Inpatient Hospital Stay: Payer: BLUE CROSS/BLUE SHIELD

## 2018-05-02 ENCOUNTER — Encounter: Payer: Self-pay | Admitting: Oncology

## 2018-05-02 ENCOUNTER — Inpatient Hospital Stay (HOSPITAL_BASED_OUTPATIENT_CLINIC_OR_DEPARTMENT_OTHER): Payer: BLUE CROSS/BLUE SHIELD | Admitting: Hematology and Oncology

## 2018-05-02 DIAGNOSIS — C541 Malignant neoplasm of endometrium: Secondary | ICD-10-CM

## 2018-05-02 DIAGNOSIS — E119 Type 2 diabetes mellitus without complications: Secondary | ICD-10-CM | POA: Diagnosis not present

## 2018-05-02 LAB — COMPREHENSIVE METABOLIC PANEL WITH GFR
ALT: 14 U/L (ref 0–44)
AST: 13 U/L — ABNORMAL LOW (ref 15–41)
Albumin: 3.9 g/dL (ref 3.5–5.0)
Alkaline Phosphatase: 64 U/L (ref 38–126)
Anion gap: 9 (ref 5–15)
BUN: 23 mg/dL — ABNORMAL HIGH (ref 6–20)
CO2: 28 mmol/L (ref 22–32)
Calcium: 10.1 mg/dL (ref 8.9–10.3)
Chloride: 105 mmol/L (ref 98–111)
Creatinine, Ser: 0.83 mg/dL (ref 0.44–1.00)
GFR calc Af Amer: >60 mL/min (ref >60)
GFR calc non Af Amer: >60 mL/min (ref >60)
Glucose, Bld: 197 mg/dL — ABNORMAL HIGH (ref 70–99)
Potassium: 4.7 mmol/L (ref 3.5–5.1)
Sodium: 142 mmol/L (ref 135–145)
Total Bilirubin: 0.3 mg/dL (ref 0.3–1.2)
Total Protein: 7.6 g/dL (ref 6.5–8.1)

## 2018-05-02 LAB — CBC WITH DIFFERENTIAL/PLATELET
Basophils Absolute: 0 K/uL (ref 0.0–0.1)
Basophils Relative: 0 %
Eosinophils Absolute: 0.1 K/uL (ref 0.0–0.5)
Eosinophils Relative: 1 %
HCT: 42.1 % (ref 34.8–46.6)
Hemoglobin: 13.8 g/dL (ref 11.6–15.9)
Lymphocytes Relative: 24 %
Lymphs Abs: 2.4 K/uL (ref 0.9–3.3)
MCH: 29.2 pg (ref 25.1–34.0)
MCHC: 32.8 g/dL (ref 31.5–36.0)
MCV: 89.2 fL (ref 79.5–101.0)
Monocytes Absolute: 0.6 K/uL (ref 0.1–0.9)
Monocytes Relative: 6 %
Neutro Abs: 6.9 K/uL — ABNORMAL HIGH (ref 1.5–6.5)
Neutrophils Relative %: 69 %
Platelets: 327 K/uL (ref 145–400)
RBC: 4.72 MIL/uL (ref 3.70–5.45)
RDW: 14.7 % — ABNORMAL HIGH (ref 11.2–14.5)
WBC: 10 K/uL (ref 3.9–10.3)

## 2018-05-02 MED ORDER — LIDOCAINE-PRILOCAINE 2.5-2.5 % EX CREA: 1.0000 "application " | TOPICAL_CREAM | CUTANEOUS | 6 refills | Status: DC | PRN

## 2018-05-02 MED ORDER — DEXAMETHASONE 4 MG PO TABS: ORAL_TABLET | ORAL | 0 refills | Status: DC

## 2018-05-02 NOTE — Assessment & Plan Note (Addendum)
We discussed the role of neoadjuvant chemotherapy With the first dose, will be used peripheral venous access.  Port placement is scheduled next week.We reviewed the NCCN guidelines We discussed the role of chemotherapy. The intent is of curative intent.  We discussed some of the risks, benefits, side-effects of carboplatin & Taxol. Treatment is intravenous, every 3 weeks x 6 cycles  Some of the short term side-effects included, though not limited to, including weight loss, life threatening infections, risk of allergic reactions, need for transfusions of blood products, nausea, vomiting, change in bowel habits, loss of hair, admission to hospital for various reasons, and risks of death.   Long term side-effects are also discussed including risks of infertility, permanent damage to nerve function, hearing loss, chronic fatigue, kidney damage with possibility needing hemodialysis, and rare secondary malignancy including bone marrow disorders.  The patient is aware that the response rates discussed earlier is not guaranteed.  After a long discussion, patient made an informed decision to proceed with the prescribed plan of care.   Patient education material was dispensed. We discussed premedication with dexamethasone before chemotherapy. I plan to reduce the dose of oral premedication due to diabetes We discussed importance of dietary modification during treatment Due to her body habitus, the calculated dose of chemotherapy is very high I will keep carboplatin at maximum dose of 750 mg.  I plan to reduce the dose of Taxol due to anticipated risk of peripheral neuropathy With her young age, I will not prescribe G-CSF support I plan minimum 3 cycles of chemotherapy before repeat imaging study Due to high risk treatment, I recommend application for short-term disability

## 2018-05-02 NOTE — Assessment & Plan Note (Signed)
She has poorly controlled diabetes We discussed the importance of close monitoring with her primary care doctor along with dietary modification as I anticipate severe hyperglycemia with steroid treatment while on chemotherapy. She would likely need additional insulin therapy while on treatment

## 2018-05-02 NOTE — Progress Notes (Signed)
[place] OFFICE PROGRESS NOTE  Patient Care Team: Monico Blitz, MD as PCP - General (Internal Medicine)  ASSESSMENT & PLAN:  Endometrial cancer [facility]) We discussed the role of neoadjuvant chemotherapy With the first dose, will be used peripheral venous access.  Port placement is scheduled next week.We reviewed the NCCN guidelines We discussed the role of chemotherapy. The intent is of curative intent.  We discussed some of the risks, benefits, side-effects of carboplatin & Taxol. Treatment is intravenous, every 3 weeks x 6 cycles  Some of the short term side-effects included, though not limited to, including weight loss, life threatening infections, risk of allergic reactions, need for transfusions of blood products, nausea, vomiting, change in bowel habits, loss of hair, admission to hospital for various reasons, and risks of death.   Long term side-effects are also discussed including risks of infertility, permanent damage to nerve function, hearing loss, chronic fatigue, kidney damage with possibility needing hemodialysis, and rare secondary malignancy including bone marrow disorders.  The patient is aware that the response rates discussed earlier is not guaranteed.  After a long discussion, patient made an informed decision to proceed with the prescribed plan of care.   Patient education material was dispensed. We discussed premedication with dexamethasone before chemotherapy. I plan to reduce the dose of oral premedication due to diabetes We discussed importance of dietary modification during treatment Due to her body habitus, the calculated dose of chemotherapy is very high I will keep carboplatin at maximum dose of 750 mg.  I plan to reduce the dose of Taxol due to anticipated risk of peripheral neuropathy With her young age, I will not prescribe G-CSF support I plan minimum 3 cycles of chemotherapy before repeat imaging study Due to high risk treatment, I recommend  application for short-term disability  Diabetes mellitus without complication ([place]) She has poorly controlled diabetes We discussed the importance of close monitoring with her primary care doctor along with dietary modification as I anticipate severe hyperglycemia with steroid treatment while on chemotherapy. She would likely need additional insulin therapy while on treatment   No orders of the defined types were placed in this encounter.   INTERVAL HISTORY: Please see below for problem oriented charting. She returns for chemotherapy consent and follow-up Her port is scheduled for next week She has seen her primary doctor recently with medication adjustment to her insulin dose Her nausea has improved She denies abdominal pain  SUMMARY OF ONCOLOGIC HISTORY:   Endometrial cancer ([place])   04/06/2018 Initial Diagnosis    She presented with severe abdominal pain    04/07/2018 Imaging    She presented to the emergency room after the pain woke her up CT imaging was performed 04/07/2018 [facility].  A 6.5 x 7 x 8.1 complex right adnexal mass was found with solid and fat components consistent with a teratoma.  No lymphadenopathy.  Small to moderate ascites and inflammatory changes were seen in the pelvis follow-up ultrasound was also performed showing concern for right ovarian torsion with a large ovarian mass cystic and solid components 9.7 cm.  A left ovarian cyst seen 3.4 cm.  Endometrial thickening with hypervascularity and endometrial thickness of 24 mm moderate pelvic free fluid.    04/07/2018 Surgery    Given the concern for torsion she was taken urgently to the operating room by Dr. Orson Ape. McLeod 04/07/2018.  At that time operative laparoscopy, torsion of the right adnexa was noted along with thickened abnormal endometrium.  She had  right salpingo-oophorectomy through a laparoscopic approach along with endometrial biopsy.      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 Biopsy of left parametrial tissue and sigmoid peritoneum  Surgeon: Bernita Raisin, MD  Specimens: pelvic washings, parametrial biopsy, sigmoid peritoneum biopsy  Complications: none  Indication for Procedure:  Patient found at OSH to have right ovarian mass that was resected and found on permanent section to be c/w endometrial cancer. Also had endometrial biopsy showing the same.  Operative Findings: Significant adhesive disease almost miliary-like. The sigmoid was adherent to the left cornua. After takedown of the sigmoid in this region it was clear the left cornua was involved in an abnormal process including necrotic tissue clinically concerning for malignancy. In addition the bladder was adherent to this region. Posterior surface of the broad ligament in area c/w the parametria on the left showed likely tumor breakthrough. Images were taken. Biopsy from the tissue extruding from the left side of the uterus in the presumed parametrium was sent for frozen. This returned worrisome for disease, but not definitive. A separate area of the sigmoid was adherent inferior to the posterior surface lesion and the remnant of this adhesion was sent as sigmoid peritoneum. This, on frozen, returned with fibrosis.     04/24/2018 Pathology Results    1. Peritoneum, biopsy - METASTATIC CARCINOMA CONSISTENT WITH PATIENT'S CLINICAL HISTORY OF PRIMARY  ENDOMETRIAL CARCINOMA. SEE NOTE 2. Soft tissue, biopsy, parametrial - METASTATIC CARCINOMA CONSISTENT WITH PATIENT'S CLINICAL HISTORY OF PRIMARY ENDOMETRIAL CARCINOMA. SEE NOTE Diagnosis Note 1. and 2. Immunohistochemical stains show that the tumor cells are positive for PAX8 and negative for calretinin, consistent with the above diagnosis    04/25/2018 Cancer Staging    Staging form: Corpus Uteri - Carcinoma and Carcinosarcoma, AJCC 8th Edition - Clinical: Stage IVA (cT4, cN0, cM0) - Signed by Heath Lark, MD on 04/25/2018     REVIEW OF SYSTEMS:   Constitutional: Denies fevers, chills or abnormal weight loss Eyes: Denies blurriness of vision Ears, nose, mouth, throat, and face: Denies mucositis or sore throat Respiratory: Denies cough, dyspnea or wheezes Cardiovascular: Denies palpitation, chest discomfort or lower extremity swelling Gastrointestinal:  Denies nausea, heartburn or change in bowel habits Skin: Denies abnormal skin rashes Lymphatics: Denies new lymphadenopathy or easy bruising Neurological:Denies numbness, tingling or new weaknesses Behavioral/Psych: Mood is stable, no new changes  All other systems were reviewed with the patient and are negative.  I have reviewed the past medical history, past surgical history, social history and family history with the patient and they are unchanged from previous note.  ALLERGIES:  has No Known Allergies.  MEDICATIONS:  Current Outpatient Medications  Medication Sig Dispense Refill  . amLODipine (NORVASC) 5 MG tablet Take 5 mg by mouth at bedtime.     Marland Kitchen dexamethasone (DECADRON) 4 MG tablet Take 3 tabs at the night before and 3 tab the morning of chemotherapy, every 3 weeks, by mouth 36 tablet 0  . empagliflozin (JARDIANCE)  10 MG TABS tablet Take 10 mg by mouth daily.     . insulin degludec (TRESIBA FLEXTOUCH) 100 UNIT/ML SOPN FlexTouch Pen Inject 25 Units into the skin daily.    Marland Kitchen lidocaine-prilocaine (EMLA) cream Apply 1  application topically as needed. 30 g 6  . lisinopril (PRINIVIL,ZESTRIL) 20 MG tablet Take 20 mg by mouth at bedtime.     . metFORMIN (GLUCOPHAGE) 500 MG tablet Take 1,000 mg by mouth 2 (two) times daily with a meal. 1 at lunch time and 1 at bedtime    . ondansetron (ZOFRAN) 8 MG tablet Take 1 tablet (8 mg total) by mouth every 8 (eight) hours as needed for nausea. 30 tablet 3  . prochlorperazine (COMPAZINE) 10 MG tablet Take 1 tablet (10 mg total) by mouth every 6 (six) hours as needed for nausea or vomiting. 30 tablet 0   No current facility-administered medications for this visit.     PHYSICAL EXAMINATION: ECOG PERFORMANCE STATUS: 1 - Symptomatic but completely ambulatory  Vitals:   05/02/18 1208  BP: 130/89  Pulse: 94  Resp: 18  Temp: 98.1 F (36.7 C)  SpO2: 99%   Filed Weights   05/02/18 1208  Weight: 168 lb 4.8 oz (76.3 kg)    GENERAL:alert, no distress and comfortable SKIN: skin color, texture, turgor are normal, no rashes or significant lesions EYES: normal, Conjunctiva are pink and non-injected, sclera clear OROPHARYNX:no exudate, no erythema and lips, buccal mucosa, and tongue normal  NECK: supple, thyroid normal size, non-tender, without nodularity LYMPH:  no palpable lymphadenopathy in the cervical, axillary or inguinal LUNGS: clear to auscultation and percussion with normal breathing effort HEART: regular rate & rhythm and no murmurs and no lower extremity edema ABDOMEN:abdomen soft, non-tender and normal bowel sounds Musculoskeletal:no cyanosis of digits and no clubbing  NEURO: alert & oriented x 3 with fluent speech, no focal motor/sensory deficits  LABORATORY DATA:  I have reviewed the data as listed    Component Value Date/Time   NA 142 05/02/2018 1103   K 4.7 05/02/2018 1103   CL 105 05/02/2018 1103   CO2 28 05/02/2018 1103   GLUCOSE 197 (H) 05/02/2018 1103   BUN 23 (H) 05/02/2018 1103   CREATININE 0.83 05/02/2018 1103   CALCIUM 10.1 05/02/2018 1103    PROT 7.6 05/02/2018 1103   ALBUMIN 3.9 05/02/2018 1103   AST 13 (L) 05/02/2018 1103   ALT 14 05/02/2018 1103   ALKPHOS 64 05/02/2018 1103   BILITOT 0.3 05/02/2018 1103   GFRNONAA >60 05/02/2018 1103   GFRAA >60 05/02/2018 1103    No results found for: SPEP, UPEP  Lab Results  Component Value Date   WBC 10.0 05/02/2018   NEUTROABS 6.9 (H) 05/02/2018   HGB 13.8 05/02/2018   HCT 42.1 05/02/2018   MCV 89.2 05/02/2018   PLT 327 05/02/2018      Chemistry      Component Value Date/Time   NA 142 05/02/2018 1103   K 4.7 05/02/2018 1103   CL 105 05/02/2018 1103   CO2 28 05/02/2018 1103   BUN 23 (H) 05/02/2018 1103   CREATININE 0.83 05/02/2018 1103      Component Value Date/Time   CALCIUM 10.1 05/02/2018 1103   ALKPHOS 64 05/02/2018 1103   AST 13 (L) 05/02/2018 1103   ALT 14 05/02/2018 1103   BILITOT 0.3 05/02/2018 1103       RADIOGRAPHIC STUDIES: I have personally reviewed the radiological images as listed and agreed with the findings in  the report. Dg Chest 2 View  Result Date: 04/23/2018 CLINICAL DATA:  Preoperative examination. Patient for hysterectomy secondary to endometrial carcinoma EXAM: CHEST - 2 VIEW COMPARISON:  None. FINDINGS: Lungs clear. Heart size normal. No pneumothorax or pleural effusion. No acute or focal bony abnormality. IMPRESSION: Negative chest. Electronically Signed   By: Inge Rise M.D.   On: 04/23/2018 10:13    All questions were answered. The patient knows to call the clinic with any problems, questions or concerns. No barriers to learning was detected.  I spent 25 minutes counseling the patient face to face. The total time spent in the appointment was 30 minutes and more than 50% was on counseling and review of test results  Heath Lark, MD 05/02/2018 2:49 PM

## 2018-05-02 NOTE — Progress Notes (Signed)
Trena Platt, Patient Financial Advocate to see if patient would qualify for financial assistance.  She said to have patient call her to set up an appointment.  Patient was given the information to contact Shauna.

## 2018-05-02 NOTE — Telephone Encounter (Signed)
Printed medical records for Kathryn Stewart, Release [id number]

## 2018-05-03 ENCOUNTER — Telehealth: Payer: Self-pay | Admitting: Oncology

## 2018-05-03 ENCOUNTER — Other Ambulatory Visit: Payer: Self-pay | Admitting: Radiology

## 2018-05-03 LAB — CA 125: Cancer Antigen (CA) 125: 56.7 U/mL — ABNORMAL HIGH (ref 0.0–38.1)

## 2018-05-03 NOTE — Progress Notes (Signed)
Disability paperwork successfully faxed to [place] at [phone]. Mailed copy to patient address on file.  

## 2018-05-03 NOTE — Telephone Encounter (Signed)
Called Kathryn Stewart and let her know the FMLA paperwork was faxed to [place] this morning.  She verbalized understanding.

## 2018-05-04 ENCOUNTER — Ambulatory Visit: Payer: BLUE CROSS/BLUE SHIELD | Admitting: Obstetrics

## 2018-05-04 ENCOUNTER — Inpatient Hospital Stay: Payer: BLUE CROSS/BLUE SHIELD

## 2018-05-04 VITALS — BP 111/78 | HR 92 | Temp 98.4°F | Resp 16

## 2018-05-04 DIAGNOSIS — C541 Malignant neoplasm of endometrium: Secondary | ICD-10-CM | POA: Diagnosis not present

## 2018-05-04 MED ORDER — SODIUM CHLORIDE 0.9 % IV SOLN
Freq: Once | INTRAVENOUS | Status: AC
  Administered 2018-05-04: 10:00:00 via INTRAVENOUS
  Filled 2018-05-04: qty 5

## 2018-05-04 MED ORDER — SODIUM CHLORIDE 0.9 % IV SOLN
140.0000 mg/m2 | Freq: Once | INTRAVENOUS | Status: AC
  Administered 2018-05-04: 252 mg via INTRAVENOUS
  Filled 2018-05-04: qty 42

## 2018-05-04 MED ORDER — DIPHENHYDRAMINE HCL 50 MG/ML IJ SOLN
INTRAMUSCULAR | Status: AC
  Filled 2018-05-04: qty 1

## 2018-05-04 MED ORDER — FAMOTIDINE IN NACL 20-0.9 MG/50ML-% IV SOLN
INTRAVENOUS | Status: AC
  Filled 2018-05-04: qty 50

## 2018-05-04 MED ORDER — SODIUM CHLORIDE 0.9 % IV SOLN
Freq: Once | INTRAVENOUS | Status: AC
  Administered 2018-05-04: 09:00:00 via INTRAVENOUS
  Filled 2018-05-04: qty 250

## 2018-05-04 MED ORDER — SODIUM CHLORIDE 0.9 % IV SOLN
750.0000 mg | Freq: Once | INTRAVENOUS | Status: AC
  Administered 2018-05-04: 750 mg via INTRAVENOUS
  Filled 2018-05-04: qty 75

## 2018-05-04 MED ORDER — FAMOTIDINE IN NACL 20-0.9 MG/50ML-% IV SOLN
20.0000 mg | Freq: Once | INTRAVENOUS | Status: AC
  Administered 2018-05-04: 20 mg via INTRAVENOUS

## 2018-05-04 MED ORDER — DIPHENHYDRAMINE HCL 50 MG/ML IJ SOLN
50.0000 mg | Freq: Once | INTRAMUSCULAR | Status: AC
  Administered 2018-05-04: 50 mg via INTRAVENOUS

## 2018-05-04 MED ORDER — PALONOSETRON HCL INJECTION 0.25 MG/5ML
0.2500 mg | Freq: Once | INTRAVENOUS | Status: AC
  Administered 2018-05-04: 0.25 mg via INTRAVENOUS

## 2018-05-04 MED ORDER — PALONOSETRON HCL INJECTION 0.25 MG/5ML
INTRAVENOUS | Status: AC
  Filled 2018-05-04: qty 5

## 2018-05-04 NOTE — Patient Instructions (Addendum)
[facility] Discharge Instructions for Patients Receiving Chemotherapy  Today you received the following chemotherapy agents carboplatin and taxol.  To help prevent nausea and vomiting after your treatment, we encourage you to take your nausea medication as directed.   If you develop nausea and vomiting that is not controlled by your nausea medication, call the clinic.   BELOW ARE SYMPTOMS THAT SHOULD BE REPORTED IMMEDIATELY:  *FEVER GREATER THAN 100.5 F  *CHILLS WITH OR WITHOUT FEVER  NAUSEA AND VOMITING THAT IS NOT CONTROLLED WITH YOUR NAUSEA MEDICATION  *UNUSUAL SHORTNESS OF BREATH  *UNUSUAL BRUISING OR BLEEDING  TENDERNESS IN MOUTH AND THROAT WITH OR WITHOUT PRESENCE OF ULCERS  *URINARY PROBLEMS  *BOWEL PROBLEMS  UNUSUAL RASH Items with * indicate a potential emergency and should be followed up as soon as possible.  Feel free to call the clinic should you have any questions or concerns. The clinic phone number is [phone].  Please show the [facility] at check-in to the Emergency Department and triage nurse.  Paclitaxel injection What is this medicine? PACLITAXEL (PAK li TAX el) is a chemotherapy drug. It targets fast dividing cells, like cancer cells, and causes these cells to die. This medicine is used to treat ovarian cancer, breast cancer, and other cancers. This medicine may be used for other purposes; ask your health care provider or pharmacist if you have questions. COMMON BRAND NAME(S): Onxol, Taxol What should I tell my health care provider before I take this medicine? They need to know if you have any of these conditions: -blood disorders -irregular heartbeat -infection (especially a virus infection such as chickenpox, cold sores, or herpes) -liver disease -previous or ongoing radiation therapy -an unusual or allergic reaction to paclitaxel, alcohol, polyoxyethylated castor oil, other chemotherapy agents, other medicines, foods,  dyes, or preservatives -pregnant or trying to get pregnant -breast-feeding How should I use this medicine? This drug is given as an infusion into a vein. It is administered in a hospital or clinic by a specially trained health care professional. Talk to your pediatrician regarding the use of this medicine in children. Special care may be needed. Overdosage: If you think you have taken too much of this medicine contact a poison control center or emergency room at once. NOTE: This medicine is only for you. Do not share this medicine with others. What if I miss a dose? It is important not to miss your dose. Call your doctor or health care professional if you are unable to keep an appointment. What may interact with this medicine? Do not take this medicine with any of the following medications: -disulfiram -metronidazole This medicine may also interact with the following medications: -cyclosporine -diazepam -ketoconazole -medicines to increase blood counts like filgrastim, pegfilgrastim, sargramostim -other chemotherapy drugs like cisplatin, doxorubicin, epirubicin, etoposide, teniposide, vincristine -quinidine -testosterone -vaccines -verapamil Talk to your doctor or health care professional before taking any of these medicines: -acetaminophen -aspirin -ibuprofen -ketoprofen -naproxen This list may not describe all possible interactions. Give your health care provider a list of all the medicines, herbs, non-prescription drugs, or dietary supplements you use. Also tell them if you smoke, drink alcohol, or use illegal drugs. Some items may interact with your medicine. What should I watch for while using this medicine? Your condition will be monitored carefully while you are receiving this medicine. You will need important blood work done while you are taking this medicine. This medicine can cause serious allergic reactions. To reduce your risk you will need  to take other medicine(s)  before treatment with this medicine. If you experience allergic reactions like skin rash, itching or hives, swelling of the face, lips, or tongue, tell your doctor or health care professional right away. In some cases, you may be given additional medicines to help with side effects. Follow all directions for their use. This drug may make you feel generally unwell. This is not uncommon, as chemotherapy can affect healthy cells as well as cancer cells. Report any side effects. Continue your course of treatment even though you feel ill unless your doctor tells you to stop. Call your doctor or health care professional for advice if you get a fever, chills or sore throat, or other symptoms of a cold or flu. Do not treat yourself. This drug decreases your body's ability to fight infections. Try to avoid being around people who are sick. This medicine may increase your risk to bruise or bleed. Call your doctor or health care professional if you notice any unusual bleeding. Be careful brushing and flossing your teeth or using a toothpick because you may get an infection or bleed more easily. If you have any dental work done, tell your dentist you are receiving this medicine. Avoid taking products that contain aspirin, acetaminophen, ibuprofen, naproxen, or ketoprofen unless instructed by your doctor. These medicines may hide a fever. Do not become pregnant while taking this medicine. Women should inform their doctor if they wish to become pregnant or think they might be pregnant. There is a potential for serious side effects to an unborn child. Talk to your health care professional or pharmacist for more information. Do not breast-feed an infant while taking this medicine. Men are advised not to father a child while receiving this medicine. This product may contain alcohol. Ask your pharmacist or healthcare provider if this medicine contains alcohol. Be sure to tell all healthcare providers you are taking this  medicine. Certain medicines, like metronidazole and disulfiram, can cause an unpleasant reaction when taken with alcohol. The reaction includes flushing, headache, nausea, vomiting, sweating, and increased thirst. The reaction can last from 30 minutes to several hours. What side effects may I notice from receiving this medicine? Side effects that you should report to your doctor or health care professional as soon as possible: -allergic reactions like skin rash, itching or hives, swelling of the face, lips, or tongue -low blood counts - This drug may decrease the number of white blood cells, red blood cells and platelets. You may be at increased risk for infections and bleeding. -signs of infection - fever or chills, cough, sore throat, pain or difficulty passing urine -signs of decreased platelets or bleeding - bruising, pinpoint red spots on the skin, black, tarry stools, nosebleeds -signs of decreased red blood cells - unusually weak or tired, fainting spells, lightheadedness -breathing problems -chest pain -high or low blood pressure -mouth sores -nausea and vomiting -pain, swelling, redness or irritation at the injection site -pain, tingling, numbness in the hands or feet -slow or irregular heartbeat -swelling of the ankle, feet, hands Side effects that usually do not require medical attention (report to your doctor or health care professional if they continue or are bothersome): -bone pain -complete hair loss including hair on your head, underarms, pubic hair, eyebrows, and eyelashes -changes in the color of fingernails -diarrhea -loosening of the fingernails -loss of appetite -muscle or joint pain -red flush to skin -sweating This list may not describe all possible side effects. Call your doctor for medical advice  about side effects. You may report side effects to FDA at 1-800-FDA-1088. Where should I keep my medicine? This drug is given in a hospital or clinic and will not be  stored at home. NOTE: This sheet is a summary. It may not cover all possible information. If you have questions about this medicine, talk to your doctor, pharmacist, or health care provider.  2018 Elsevier/Gold Standard (2015-06-23 19:58:00)  Carboplatin injection What is this medicine? CARBOPLATIN (KAR boe pla tin) is a chemotherapy drug. It targets fast dividing cells, like cancer cells, and causes these cells to die. This medicine is used to treat ovarian cancer and many other cancers. This medicine may be used for other purposes; ask your health care provider or pharmacist if you have questions. COMMON BRAND NAME(S): Paraplatin What should I tell my health care provider before I take this medicine? They need to know if you have any of these conditions: -blood disorders -hearing problems -kidney disease -recent or ongoing radiation therapy -an unusual or allergic reaction to carboplatin, cisplatin, other chemotherapy, other medicines, foods, dyes, or preservatives -pregnant or trying to get pregnant -breast-feeding How should I use this medicine? This drug is usually given as an infusion into a vein. It is administered in a hospital or clinic by a specially trained health care professional. Talk to your pediatrician regarding the use of this medicine in children. Special care may be needed. Overdosage: If you think you have taken too much of this medicine contact a poison control center or emergency room at once. NOTE: This medicine is only for you. Do not share this medicine with others. What if I miss a dose? It is important not to miss a dose. Call your doctor or health care professional if you are unable to keep an appointment. What may interact with this medicine? -medicines for seizures -medicines to increase blood counts like filgrastim, pegfilgrastim, sargramostim -some antibiotics like amikacin, gentamicin, neomycin, streptomycin, tobramycin -vaccines Talk to your doctor or  health care professional before taking any of these medicines: -acetaminophen -aspirin -ibuprofen -ketoprofen -naproxen This list may not describe all possible interactions. Give your health care provider a list of all the medicines, herbs, non-prescription drugs, or dietary supplements you use. Also tell them if you smoke, drink alcohol, or use illegal drugs. Some items may interact with your medicine. What should I watch for while using this medicine? Your condition will be monitored carefully while you are receiving this medicine. You will need important blood work done while you are taking this medicine. This drug may make you feel generally unwell. This is not uncommon, as chemotherapy can affect healthy cells as well as cancer cells. Report any side effects. Continue your course of treatment even though you feel ill unless your doctor tells you to stop. In some cases, you may be given additional medicines to help with side effects. Follow all directions for their use. Call your doctor or health care professional for advice if you get a fever, chills or sore throat, or other symptoms of a cold or flu. Do not treat yourself. This drug decreases your body's ability to fight infections. Try to avoid being around people who are sick. This medicine may increase your risk to bruise or bleed. Call your doctor or health care professional if you notice any unusual bleeding. Be careful brushing and flossing your teeth or using a toothpick because you may get an infection or bleed more easily. If you have any dental work done, tell your dentist  you are receiving this medicine. Avoid taking products that contain aspirin, acetaminophen, ibuprofen, naproxen, or ketoprofen unless instructed by your doctor. These medicines may hide a fever. Do not become pregnant while taking this medicine. Women should inform their doctor if they wish to become pregnant or think they might be pregnant. There is a potential for  serious side effects to an unborn child. Talk to your health care professional or pharmacist for more information. Do not breast-feed an infant while taking this medicine. What side effects may I notice from receiving this medicine? Side effects that you should report to your doctor or health care professional as soon as possible: -allergic reactions like skin rash, itching or hives, swelling of the face, lips, or tongue -signs of infection - fever or chills, cough, sore throat, pain or difficulty passing urine -signs of decreased platelets or bleeding - bruising, pinpoint red spots on the skin, black, tarry stools, nosebleeds -signs of decreased red blood cells - unusually weak or tired, fainting spells, lightheadedness -breathing problems -changes in hearing -changes in vision -chest pain -high blood pressure -low blood counts - This drug may decrease the number of white blood cells, red blood cells and platelets. You may be at increased risk for infections and bleeding. -nausea and vomiting -pain, swelling, redness or irritation at the injection site -pain, tingling, numbness in the hands or feet -problems with balance, talking, walking -trouble passing urine or change in the amount of urine Side effects that usually do not require medical attention (report to your doctor or health care professional if they continue or are bothersome): -hair loss -loss of appetite -metallic taste in the mouth or changes in taste This list may not describe all possible side effects. Call your doctor for medical advice about side effects. You may report side effects to FDA at 1-800-FDA-1088. Where should I keep my medicine? This drug is given in a hospital or clinic and will not be stored at home. NOTE: This sheet is a summary. It may not cover all possible information. If you have questions about this medicine, talk to your doctor, pharmacist, or health care provider.  2018 Elsevier/Gold Standard  (2007-11-27 14:38:05)

## 2018-05-08 ENCOUNTER — Encounter: Payer: Self-pay | Admitting: Hematology and Oncology

## 2018-05-08 ENCOUNTER — Ambulatory Visit (HOSPITAL_COMMUNITY)
Admission: RE | Admit: 2018-05-08 | Discharge: 2018-05-08 | Disposition: A | Discharge status: Home / Self Care | Payer: BLUE CROSS/BLUE SHIELD | Source: Ambulatory Visit | Attending: Hematology and Oncology | Admitting: Hematology and Oncology

## 2018-05-08 ENCOUNTER — Other Ambulatory Visit: Payer: Self-pay | Admitting: Radiology

## 2018-05-08 ENCOUNTER — Encounter (HOSPITAL_COMMUNITY): Payer: Self-pay

## 2018-05-08 DIAGNOSIS — Z794 Long term (current) use of insulin: Secondary | ICD-10-CM | POA: Insufficient documentation

## 2018-05-08 DIAGNOSIS — I10 Essential (primary) hypertension: Secondary | ICD-10-CM | POA: Diagnosis not present

## 2018-05-08 DIAGNOSIS — E119 Type 2 diabetes mellitus without complications: Secondary | ICD-10-CM | POA: Diagnosis not present

## 2018-05-08 DIAGNOSIS — J45909 Unspecified asthma, uncomplicated: Secondary | ICD-10-CM | POA: Diagnosis not present

## 2018-05-08 DIAGNOSIS — C541 Malignant neoplasm of endometrium: Secondary | ICD-10-CM | POA: Insufficient documentation

## 2018-05-08 DIAGNOSIS — M1990 Unspecified osteoarthritis, unspecified site: Secondary | ICD-10-CM | POA: Diagnosis not present

## 2018-05-08 DIAGNOSIS — K219 Gastro-esophageal reflux disease without esophagitis: Secondary | ICD-10-CM | POA: Insufficient documentation

## 2018-05-08 HISTORY — PX: IR IMAGING GUIDED PORT INSERTION: IMG5740

## 2018-05-08 LAB — CBC
HCT: 45.3 % (ref 36.0–46.0)
HEMOGLOBIN: 14.6 g/dL (ref 12.0–15.0)
MCH: 28.6 pg (ref 26.0–34.0)
MCHC: 32.2 g/dL (ref 30.0–36.0)
MCV: 88.8 fL (ref 78.0–100.0)
Platelets: 257 10*3/uL (ref 150–400)
RBC: 5.1 MIL/uL (ref 3.87–5.11)
RDW: 14.2 % (ref 11.5–15.5)
WBC: 7.1 10*3/uL (ref 4.0–10.5)

## 2018-05-08 LAB — GLUCOSE, CAPILLARY
Glucose-Capillary: 185 mg/dL — ABNORMAL HIGH (ref 70–99)
Glucose-Capillary: 155 mg/dL — ABNORMAL HIGH (ref 70–99)

## 2018-05-08 LAB — BASIC METABOLIC PANEL
ANION GAP: 10 (ref 5–15)
BUN: 14 mg/dL (ref 6–20)
CALCIUM: 9.2 mg/dL (ref 8.9–10.3)
CO2: 26 mmol/L (ref 22–32)
CREATININE: 0.67 mg/dL (ref 0.44–1.00)
Chloride: 103 mmol/L (ref 98–111)
Glucose, Bld: 193 mg/dL — ABNORMAL HIGH (ref 70–99)
Potassium: 3.7 mmol/L (ref 3.5–5.1)
Sodium: 139 mmol/L (ref 135–145)

## 2018-05-08 LAB — PROTIME-INR
INR: 0.88
PROTHROMBIN TIME: 11.9 s (ref 11.4–15.2)

## 2018-05-08 LAB — PREGNANCY, URINE: PREG TEST UR: NEGATIVE

## 2018-05-08 MED ORDER — MIDAZOLAM HCL 2 MG/2ML IJ SOLN
INTRAMUSCULAR | Status: AC
  Filled 2018-05-08: qty 2

## 2018-05-08 MED ORDER — LIDOCAINE-EPINEPHRINE (PF) 1 %-1:200000 IJ SOLN
INTRAMUSCULAR | Status: AC
  Filled 2018-05-08: qty 30

## 2018-05-08 MED ORDER — LIDOCAINE HCL 1 % IJ SOLN
INTRAMUSCULAR | Status: AC | PRN
  Administered 2018-05-08: 10 mL

## 2018-05-08 MED ORDER — SODIUM CHLORIDE 0.9 % IV SOLN: INTRAVENOUS | Status: DC

## 2018-05-08 MED ORDER — CEFAZOLIN SODIUM-DEXTROSE 2-4 GM/100ML-% IV SOLN
INTRAVENOUS | Status: AC
  Filled 2018-05-08: qty 100

## 2018-05-08 MED ORDER — LORAZEPAM 0.5 MG PO TABS
0.5000 mg | ORAL_TABLET | Freq: Once | ORAL | Status: AC
  Administered 2018-05-08: 0.5 mg via ORAL

## 2018-05-08 MED ORDER — HEPARIN SOD (PORK) LOCK FLUSH 100 UNIT/ML IV SOLN
INTRAVENOUS | Status: AC
  Filled 2018-05-08: qty 5

## 2018-05-08 MED ORDER — SODIUM CHLORIDE 0.9 % IV SOLN
INTRAVENOUS | Status: AC | PRN
  Administered 2018-05-08: 10 mL/h via INTRAVENOUS

## 2018-05-08 MED ORDER — FENTANYL CITRATE (PF) 100 MCG/2ML IJ SOLN
INTRAMUSCULAR | Status: AC | PRN
  Administered 2018-05-08: 50 ug via INTRAVENOUS
  Administered 2018-05-08 (×2): 25 ug via INTRAVENOUS

## 2018-05-08 MED ORDER — LIDOCAINE-EPINEPHRINE (PF) 1 %-1:200000 IJ SOLN
INTRAMUSCULAR | Status: AC | PRN
  Administered 2018-05-08: 10 mL via INTRADERMAL

## 2018-05-08 MED ORDER — HEPARIN SOD (PORK) LOCK FLUSH 100 UNIT/ML IV SOLN: INTRAVENOUS | Status: AC | PRN

## 2018-05-08 MED ORDER — LORAZEPAM 0.5 MG PO TABS
ORAL_TABLET | ORAL | Status: AC
  Filled 2018-05-08: qty 1

## 2018-05-08 MED ORDER — LIDOCAINE HCL 1 % IJ SOLN
INTRAMUSCULAR | Status: AC
  Filled 2018-05-08: qty 20

## 2018-05-08 MED ORDER — HEPARIN SOD (PORK) LOCK FLUSH 100 UNIT/ML IV SOLN
INTRAVENOUS | Status: AC | PRN
  Administered 2018-05-08: 500 [IU] via INTRAVENOUS

## 2018-05-08 MED ORDER — FENTANYL CITRATE (PF) 100 MCG/2ML IJ SOLN
INTRAMUSCULAR | Status: AC
  Filled 2018-05-08: qty 2

## 2018-05-08 MED ORDER — CEFAZOLIN SODIUM-DEXTROSE 2-4 GM/100ML-% IV SOLN
2.0000 g | INTRAVENOUS | Status: AC
  Administered 2018-05-08: 2 g via INTRAVENOUS

## 2018-05-08 MED ORDER — MIDAZOLAM HCL 2 MG/2ML IJ SOLN
INTRAMUSCULAR | Status: AC | PRN
  Administered 2018-05-08: 0.5 mg via INTRAVENOUS
  Administered 2018-05-08: 1 mg via INTRAVENOUS
  Administered 2018-05-08: 0.5 mg via INTRAVENOUS

## 2018-05-08 NOTE — Progress Notes (Signed)
Returned patient's call from voicemail left. Left message with my contact name and number and advised I was returning her call.

## 2018-05-08 NOTE — H&P (Signed)
Chief Complaint: Patient was seen in consultation today for [facility] a cath placement at the request of [facility]  Referring Physician(s): Heath Lark  Supervising Physician: Markus Daft  Patient Status: [facility] - Out-pt  History of Present Illness: Kathryn Stewart is a 37 y.o. female   Newly diagnosed endometrial cancer Port a cath placement today Resume chemotherapy 04/28/18   Past Medical History:  Diagnosis Date  . Arthritis   . Asthma   . Complication of anesthesia   . 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 GERD (gastroesophageal reflux disease)   . Hypertension   . PONV (postoperative nausea and vomiting)     Past Surgical History:  Procedure Laterality Date  . LAPAROSCOPIC SALPINGOOPHERECTOMY Right 2019   torsion  . ROBOTIC ASSISTED TOTAL HYSTERECTOMY WITH BILATERAL SALPINGO OOPHERECTOMY N/A 04/24/2018   Procedure: XI ROBOTIC  DIAGNOSTIC LAPAROSCOPY WITH BIOPSY.;  Surgeon: [name], MD;  Location: [facility] ORS;  Service: Gynecology;  Laterality: N/A;    Allergies: Patient has no known allergies.  Medications: Prior to Admission medications   Medication Sig Start Date End Date Taking? Authorizing Provider  amLODipine (NORVASC) 5 MG tablet Take 5 mg by mouth at bedtime.  03/23/18  Yes [provider]  dexamethasone (DECADRON) 4 MG tablet Take 3 tabs at the night before and 3 tab the morning of chemotherapy, every 3 weeks, by mouth 05/02/18  Yes Gorsuch, Ni, MD  empagliflozin (JARDIANCE) 10 MG TABS tablet Take 10 mg by mouth daily.  02/12/18  Yes [provider]  insulin degludec (TRESIBA FLEXTOUCH) 100 UNIT/ML SOPN FlexTouch Pen Inject 25 Units into the skin daily. 01/23/18  Yes [provider]  lisinopril (PRINIVIL,ZESTRIL) 20 MG tablet Take 20 mg by mouth at bedtime.  03/24/18  Yes [provider]  metFORMIN (GLUCOPHAGE) 500 MG tablet Take 1,000 mg by mouth 2 (two) times daily with a meal. 1 at lunch time  and 1 at bedtime 03/23/18  Yes [provider]  lidocaine-prilocaine (EMLA) cream Apply 1 application topically as needed. 05/02/18   Heath Lark, MD  ondansetron (ZOFRAN) 8 MG tablet Take 1 tablet (8 mg total) by mouth every 8 (eight) hours as needed for nausea. 04/26/18   Heath Lark, MD  prochlorperazine (COMPAZINE) 10 MG tablet Take 1 tablet (10 mg total) by mouth every 6 (six) hours as needed for nausea or vomiting. 04/26/18   Heath Lark, MD     Family History  Problem Relation Age of Onset  . Diabetes Mother   . Cervical cancer Mother   . Heart attack Father   . Diabetes Brother     Social History   Socioeconomic History  . Marital status: Married    Spouse name: Richard  . Number of children: Not on file  . Years of education: Not on file  . Highest education level: Not on file  Occupational History  . Occupation: Freight forwarder  Social Needs  . Financial resource strain: Not on file  . Food insecurity:    Worry: Not on file    Inability: Not on file  . Transportation needs:    Medical: Not on file    Non-medical: Not on file  Tobacco Use  . Smoking status: Never Smoker  . Smokeless tobacco: Never Used  Substance and Sexual Activity  . Alcohol use: No  . Drug use: No  . Sexual activity: Not on file  Lifestyle  . Physical activity:    Days per week: Not on  file    Minutes per session: Not on file  . Stress: Not on file  Relationships  . Social connections:    Talks on phone: Not on file    Gets together: Not on file    Attends religious service: Not on file    Active member of club or organization: Not on file    Attends meetings of clubs or organizations: Not on file    Relationship status: Not on file  Other Topics Concern  . Not on file  Social History Narrative  . Not on file    Review of Systems: A 12 point ROS discussed and pertinent positives are indicated in the HPI above.  All other systems are negative.  Review of Systems  Constitutional:  Positive for activity change, appetite change and fatigue. Negative for fever.  Respiratory: Negative for cough and shortness of breath.   Cardiovascular: Negative for chest pain.  Gastrointestinal: Negative for abdominal pain.  Neurological: Positive for weakness.  Psychiatric/Behavioral: Negative for behavioral problems and confusion.    Vital Signs: BP (!) 123/98   Pulse 90   Temp 97.8 F (36.6 C) (Oral)   Resp 16   Ht 5' 1.5" (1.562 m)   Wt 168 lb (76.2 kg)   LMP 03/18/2018   SpO2 98%   BMI 31.23 kg/m   Physical Exam  Constitutional: She is oriented to person, place, and time.  Cardiovascular: Normal rate, regular rhythm and normal heart sounds.  Pulmonary/Chest: Effort normal and breath sounds normal.  Abdominal: Soft. Bowel sounds are normal.  Musculoskeletal: Normal range of motion.  Neurological: She is alert and oriented to person, place, and time.  Skin: Skin is warm and dry.  Psychiatric: She has a normal mood and affect. Her behavior is normal. Judgment and thought content normal.  Vitals reviewed.   Imaging: Dg Chest 2 View  Result Date: 04/23/2018 CLINICAL DATA:  Preoperative examination. Patient for hysterectomy secondary to endometrial carcinoma EXAM: CHEST - 2 VIEW COMPARISON:  None. FINDINGS: Lungs clear. Heart size normal. No pneumothorax or pleural effusion. No acute or focal bony abnormality. IMPRESSION: Negative chest. Electronically Signed   By: Inge Rise M.D.   On: 04/23/2018 10:13    Labs:  CBC: Recent Labs    04/23/18 0952 05/02/18 1103 05/08/18 0707  WBC 10.9* 10.0 7.1  HGB 15.0 13.8 14.6  HCT 44.6 42.1 45.3  PLT 412* 327 257    COAGS: No results for input(s): INR, APTT in the last 8760 hours.  BMP: Recent Labs    04/23/18 0952 05/02/18 1103  NA 140 142  K 4.8 4.7  CL 102 105  CO2 29 28  GLUCOSE 170* 197*  BUN 31* 23*  CALCIUM 10.2 10.1  CREATININE 0.91 0.83  GFRNONAA >60 >60  GFRAA >60 >60    LIVER FUNCTION  TESTS: Recent Labs    04/23/18 0952 05/02/18 1103  BILITOT 0.8 0.3  AST 19 13*  ALT 26 14  ALKPHOS 58 64  PROT 7.9 7.6  ALBUMIN 4.1 3.9    TUMOR MARKERS: No results for input(s): AFPTM, CEA, CA199, CHROMGRNA in the last 8760 hours.  Assessment and Plan:  Endometrial Cancer Ongoing chemotherapy Next dose 04/28/2018 Scheduled for Port a cath placement Risks and benefits of image guided port-a-catheter placement was discussed with the patient including, but not limited to bleeding, infection, pneumothorax, or fibrin sheath development and need for additional procedures.  All of the patient's questions were answered, patient is agreeable to proceed.  Consent signed and in chart.    Thank you for this interesting consult.  I greatly enjoyed meeting Ilana Wellborn and look forward to participating in their care.  A copy of this report was sent to the requesting provider on this date.  Electronically Signed: Lavonia Drafts, PA-C 05/08/2018, 7:40 AM   I spent a total of  30 Minutes   in face to face in clinical consultation, greater than 50% of which was counseling/coordinating care for [facility] placement

## 2018-05-08 NOTE — Discharge Instructions (Addendum)
Implanted Port Insertion, Care After °This sheet gives you information about how to care for yourself after your procedure. Your health care provider may also give you more specific instructions. If you have problems or questions, contact your health care provider. °What can I expect after the procedure? °After your procedure, it is common to have: °· Discomfort at the port insertion site. °· Bruising on the skin over the port. This should improve over 3-4 days. ° °Follow these instructions at home: °Port care °· After your port is placed, you will get a manufacturer's information card. The card has information about your port. Keep this card with you at all times. °· Take care of the port as told by your health care provider. Ask your health care provider if you or a family member can get training for taking care of the port at home. A home health care nurse may also take care of the port. °· Make sure to remember what type of port you have. °Incision care °· Follow instructions from your health care provider about how to take care of your port insertion site. Make sure you: °? Wash your hands with soap and water before you change your bandage (dressing). If soap and water are not available, use hand sanitizer. °? Change your dressing as told by your health care provider. °? Leave stitches (sutures), skin glue, or adhesive strips in place. These skin closures may need to stay in place for 2 weeks or longer. If adhesive strip edges start to loosen and curl up, you may trim the loose edges. Do not remove adhesive strips completely unless your health care provider tells you to do that. °· Check your port insertion site every day for signs of infection. Check for: °? More redness, swelling, or pain. °? More fluid or blood. °? Warmth. °? Pus or a bad smell. °General instructions °· Do not take baths, swim, or use a hot tub until your health care provider approves. °· Do not lift anything that is heavier than 10 lb (4.5  kg) for a week, or as told by your health care provider. °· Ask your health care provider when it is okay to: °? Return to work or school. °? Resume usual physical activities or sports. °· Do not drive for 24 hours if you were given a medicine to help you relax (sedative). °· Take over-the-counter and prescription medicines only as told by your health care provider. °· Wear a medical alert bracelet in case of an emergency. This will tell any health care providers that you have a port. °· Keep all follow-up visits as told by your health care provider. This is important. °Contact a health care provider if: °· You cannot flush your port with saline as directed, or you cannot draw blood from the port. °· You have a fever or chills. °· You have more redness, swelling, or pain around your port insertion site. °· You have more fluid or blood coming from your port insertion site. °· Your port insertion site feels warm to the touch. °· You have pus or a bad smell coming from the port insertion site. °Get help right away if: °· You have chest pain or shortness of breath. °· You have bleeding from your port that you cannot control. °Summary °· Take care of the port as told by your health care provider. °· Change your dressing as told by your health care provider. °· Keep all follow-up visits as told by your health care provider. °  This information is not intended to replace advice given to you by your health care provider. Make sure you discuss any questions you have with your health care provider. °Document Released: 06/12/2013 Document Revised: 07/13/2016 Document Reviewed: 07/13/2016 °Elsevier Interactive Patient Education © 2017 [employer] °Moderate Conscious Sedation, Adult, Care After °These instructions provide you with information about caring for yourself after your procedure. Your health care provider may also give you more specific instructions. Your treatment has been planned according to current medical  practices, but problems sometimes occur. Call your health care provider if you have any problems or questions after your procedure. °What can I expect after the procedure? °After your procedure, it is common: °· To feel sleepy for several hours. °· To feel clumsy and have poor balance for several hours. °· To have poor judgment for several hours. °· To vomit if you eat too soon. ° °Follow these instructions at home: °For at least 24 hours after the procedure: ° °· Do not: °? Participate in activities where you could fall or become injured. °? Drive. °? Use heavy machinery. °? Drink alcohol. °? Take sleeping pills or medicines that cause drowsiness. °? Make important decisions or sign legal documents. °? Take care of children on your own. °· Rest. °Eating and drinking °· Follow the diet recommended by your health care provider. °· If you vomit: °? Drink water, juice, or soup when you can drink without vomiting. °? Make sure you have little or no nausea before eating solid foods. °General instructions °· Have a responsible adult stay with you until you are awake and alert. °· Take over-the-counter and prescription medicines only as told by your health care provider. °· If you smoke, do not smoke without supervision. °· Keep all follow-up visits as told by your health care provider. This is important. °Contact a health care provider if: °· You keep feeling nauseous or you keep vomiting. °· You feel light-headed. °· You develop a rash. °· You have a fever. °Get help right away if: °· You have trouble breathing. °This information is not intended to replace advice given to you by your health care provider. Make sure you discuss any questions you have with your health care provider. °Document Released: 06/12/2013 Document Revised: 01/25/2016 Document Reviewed: 12/12/2015 °Elsevier Interactive Patient Education © 2018 [employer] ° °

## 2018-05-08 NOTE — Procedures (Signed)
Placement of right jugular port.  Tip at SVC/RA junction.  Minimal blood loss and no immediate complication.  

## 2018-05-11 ENCOUNTER — Ambulatory Visit: Payer: BLUE CROSS/BLUE SHIELD | Admitting: Obstetrics

## 2018-05-14 ENCOUNTER — Telehealth: Payer: Self-pay | Admitting: *Deleted

## 2018-05-14 NOTE — Telephone Encounter (Signed)
Called and scheduled an appt for a post op appt.

## 2018-05-24 NOTE — Progress Notes (Addendum)
[place] at [facility]   Progress Note: Established Patient Follow-Up   Consult was originally requested by Dr. 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  In addition an endometrial biopsy showing the same.  No chief complaint on file. 1. 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 Possible parametrial involvement  05/04/18 - present Carbo/Taxol  HPI: [name]  is a very nice 37 y.o.  P0   Interval History Since her last visit I took her to surgery planning to do a completion surgery however she had left cornual/serosal disease fusing to the sigmoid and possibly emanating from her left parametrium. Intraoperative findings as follows: Significant adhesive disease almost miliary-like. The sigmoid was adherent to the left cornua. After takedown of the sigmoid in this region it was clear the left cornua was involved in an abnormal process including necrotic tissue clinically concerning for malignancy. In addition the bladder was adherent to this region. Posterior surface of the broad ligament in area c/w the parametria on the left showed likely tumor breakthrough. Images were taken. Biopsy from the tissue extruding from the left side of the uterus in the presumed parametrium was sent for frozen. This returned worrisome for disease, but not definitive. A separate area of the sigmoid was adherent inferior to the posterior surface lesion and the remnant of this adhesion was sent as sigmoid peritoneum. Tissue biopsies as follows: Peritoneum, biopsy (Sigmoid peritoneum) - METASTATIC CARCINOMA CONSISTENT WITH PATIENT'S CLINICAL HISTORY OF PRIMARY ENDOMETRIAL CARCINOMA. SEE NOTE 2. Soft tissue, biopsy, parametrial - METASTATIC CARCINOMA CONSISTENT WITH PATIENT'S CLINICAL  HISTORY OF PRIMARY ENDOMETRIAL CARCINOMA. SEE NOTE  She was supposed to see me postop for treatment planning discussion but didn't return until today. She states her appointment was cancelled by one the clinic staff and no one called her for a followup.  She started chemotherapy with Carboplatin/Taxol 05/04/18  She has treatment related alopecia and some sinus congestion. She did note a rash at the surgery sites and the port site. Dermabond was used at all sites. She was told she must have a Latex allergy and so now that is on the chart.   Oncologic Course She was in her usual state of health until the evening of 04/06/18 at which time she woke up with significant right lower quadrant pain.  She presented to the emergency room after the pain woke her up CT imaging was performed 04/07/2018 [facility] rocking him.  A 6.5 x 7 x 8.1 complex right adnexal mass was found with solid and fat components consistent with a teratoma.  No lymphadenopathy.  Small to moderate ascites and inflammatory changes were seen in the pelvis follow-up ultrasound was also performed showing concern for right ovarian torsion with a large ovarian mass cystic and solid components 9.7 cm.  A left ovarian cyst seen 3.4 cm.  Endometrial thickening with hypervascularity and endometrial thickness of 24 mm moderate pelvic free fluid.  Given the concern for torsion she was taken urgently to the operating room by Dr. Orson Ape. McLeod 04/07/2018.  At that time operative laparoscopy with right salpingooophorectomy and findings consistent with torsion was performed.  In addition endometrial biopsy was performed.  Unfortunately final pathology reveals in the right ovary and endometrioid type adenocarcinoma the endometrial biopsy showed endometrioid type adenocarcinoma.  Comments include a favoring of endometrium as the  primary.  The patient is therefore referred for recommendations and probable management.  She denied nausea , vomiting, denied recent  bloating or pain denied any changes in her bowel habits or urinary habits.  She denies excess weight loss and she states she has a normal appetite.  She does admit to a long history of abnormal menses.  She states that she started her menstrual cycle at age 55.  There is documentation on the chart of possible PCOS.  Periods are described as both irregular and heavy with episodes intermittently of amenorrhea.  Imported EPIC Oncologic History:    Endometrial cancer ([place])   04/06/2018 Initial Diagnosis    She presented with severe abdominal pain    04/07/2018 Imaging    She presented to the emergency room after the pain woke her up CT imaging was performed 04/07/2018 [facility].  A 6.5 x 7 x 8.1 complex right adnexal mass was found with solid and fat components consistent with a teratoma.  No lymphadenopathy.  Small to moderate ascites and inflammatory changes were seen in the pelvis follow-up ultrasound was also performed showing concern for right ovarian torsion with a large ovarian mass cystic and solid components 9.7 cm.  A left ovarian cyst seen 3.4 cm.  Endometrial thickening with hypervascularity and endometrial thickness of 24 mm moderate pelvic free fluid.    04/07/2018 Surgery    Given the concern for torsion she was taken urgently to the operating room by Dr. Orson Ape. McLeod 04/07/2018.  At that time operative laparoscopy, torsion of the right adnexa was noted along with thickened abnormal endometrium.  She had right salpingo-oophorectomy through a laparoscopic approach along with endometrial biopsy.      04/07/2018 Pathology Results    Right ovary showed adenocarcinoma, endometrioid type Endometrial biopsy showed adenocarcinoma    04/23/2018 Tumor Marker    Patient's tumor was tested for the following markers: CA-125 Results of the tumor marker test revealed 73.2    04/24/2018 Surgery    Preoperative Diagnosis: At least Stage 3 Endometrial Cancer with metastases to right ovary    Postoperative Diagnosis:  At least Stage 3, possible Stage 4 Endometrial Cancer with metastases to right ovary and questionable parametrial and sigmoid peritoneum  Procedure(s) Performed: Pelvic washings, Lysis of adhesions. Biopsy of left parametrial tissue and sigmoid peritoneum  Surgeon: Bernita Raisin, MD  Specimens: pelvic washings, parametrial biopsy, sigmoid peritoneum biopsy  Complications: none  Indication for Procedure:  Patient found at OSH to have right ovarian mass that was resected and found on permanent section to be c/w endometrial cancer. Also had endometrial biopsy showing the same.  Operative Findings: Significant adhesive disease almost miliary-like. The sigmoid was adherent to the left cornua. After takedown of the sigmoid in this region it was clear the left cornua was involved in an abnormal process including necrotic tissue clinically concerning for malignancy. In addition the bladder was adherent to this region. Posterior surface of the broad ligament in area c/w the parametria on the left showed likely tumor breakthrough. Images were taken. Biopsy from the tissue extruding from the left side of the uterus in the presumed parametrium was sent for frozen. This returned worrisome for disease, but not definitive. A separate area of the sigmoid was adherent inferior to the posterior surface lesion and the remnant of this adhesion was sent as sigmoid peritoneum. This, on frozen, returned with fibrosis.     04/24/2018 Pathology Results    1. Peritoneum, biopsy - METASTATIC CARCINOMA CONSISTENT WITH  PATIENT'S CLINICAL HISTORY OF PRIMARY ENDOMETRIAL CARCINOMA. SEE NOTE 2. Soft tissue, biopsy, parametrial - METASTATIC CARCINOMA CONSISTENT WITH PATIENT'S CLINICAL HISTORY OF PRIMARY ENDOMETRIAL CARCINOMA. SEE NOTE Diagnosis Note 1. and 2. Immunohistochemical stains show that the tumor cells are positive for PAX8 and negative for calretinin, consistent with the above  diagnosis    04/25/2018 Cancer Staging    Staging form: Corpus Uteri - Carcinoma and Carcinosarcoma, AJCC 8th Edition - Clinical: Stage IVA (cT4, cN0, cM0) - Signed by Heath Lark, MD on 04/25/2018    05/02/2018 Tumor Marker    Patient's tumor was tested for the following markers: CA-125 Results of the tumor marker test revealed 56.7    05/04/2018 -  Chemotherapy    The patient had carboplatin and Taxol    05/08/2018 Procedure    Placement of a power injectable Port-A-Cath     Measurement of disease: CA125 potentially and clinical exam, likely will plan imaging for some portion of surveillance . Preop (however after RSO) 04/23/18 = 73.2 . 05/02/18 = 56.7 (prechemo)  Radiology: Ir Imaging Guided Port Insertion  Result Date: 05/08/2018 INDICATION: 37 year old with endometrial cancer. Port-A-Cath needed for therapy. EXAM: FLUOROSCOPIC AND ULTRASOUND GUIDED PLACEMENT OF A SUBCUTANEOUS PORT COMPARISON:  None. MEDICATIONS: Ancef 2 g; The antibiotic was administered within an appropriate time interval prior to skin puncture. ANESTHESIA/SEDATION: Versed 2 mg IV; Fentanyl 100 mcg IV; Ativan 0.5 mg sublingual Moderate Sedation Time:  66 The patient was continuously monitored during the procedure by the interventional radiology nurse under my direct supervision. FLUOROSCOPY TIME:  Two minutes, 6 seconds (8 mGy) COMPLICATIONS: None immediate. PROCEDURE: The procedure, risks, benefits, and alternatives were explained to the patient. Questions regarding the procedure were encouraged and answered. The patient understands and consents to the procedure. Patient was placed supine on the interventional table. Ultrasound confirmed a patent right internal jugular vein. Ultrasound image was saved for documentation. The right chest and neck were cleaned with a skin antiseptic and a sterile drape was placed. Maximal barrier sterile technique was utilized including caps, mask, sterile gowns, sterile gloves, sterile drape,  hand hygiene and skin antiseptic. The right neck was anesthetized with 1% lidocaine. Small incision was made in the right neck with a blade. Micropuncture set was placed in the right internal jugular vein with ultrasound guidance. The micropuncture wire was used for measurement purposes. The right chest was anesthetized with 1% lidocaine with epinephrine. #15 blade was used to make an incision and a subcutaneous port pocket was formed. [place] was assembled. Subcutaneous tunnel was formed with a stiff tunneling device. The port catheter was brought through the subcutaneous tunnel. The port was placed in the subcutaneous pocket. The micropuncture set was exchanged for a peel-away sheath. The catheter was placed through the peel-away sheath and the tip was positioned at the superior cavoatrial junction. However, the catheter was kinked near the neck. While trying to fix the kink, the catheter was pulling out of the SVC. Attempted to position the catheter over a wire but the catheter continued to pull out of the SVC. As a result, this port was completely removed. The right internal jugular vein was punctured again with a new micropuncture set using ultrasound guidance. Micropuncture dilator set was placed. A new 8 French large profile port was selected. Port was tunneled through the subcutaneous tissue. Micropuncture catheter was exchanged for a peel-away sheath over a J wire. Catheter was cut to an appropriate length and advanced through the peel-away sheath. Catheter tip  placed at the junction of the superior vena cava and right atrium. Port was accessed and found to aspirate and flush well. Heparinized saline was placed in the port. Catheter was sutured to the subcutaneous pocket. The port pocket was closed using two layers of absorbable sutures and Dermabond. The vein skin site was closed using a single layer of absorbable suture and Dermabond. Sterile dressings were applied. Patient tolerated the  procedure well without an immediate complication. Ultrasound and fluoroscopic images were taken and saved for this procedure. FINDINGS: Catheter tip at the junction of superior vena cava and right atrium. IMPRESSION: Placement of a power injectable Port-A-Cath. Electronically Signed   By: Markus Daft M.D.   On: 05/08/2018 10:37   . 04/07/18 - CT AP and TVUS as noted in HPI  Outpatient Encounter Medications as of 05/25/2018  Medication Sig  . amLODipine (NORVASC) 5 MG tablet Take 5 mg by mouth at bedtime.   Marland Kitchen dexamethasone (DECADRON) 4 MG tablet Take 3 tabs at the night before and 3 tab the morning of chemotherapy, every 3 weeks, by mouth  . empagliflozin (JARDIANCE) 10 MG TABS tablet Take 10 mg by mouth daily.   . insulin degludec (TRESIBA FLEXTOUCH) 100 UNIT/ML SOPN FlexTouch Pen Inject 25 Units into the skin daily.  Marland Kitchen lidocaine-prilocaine (EMLA) cream Apply 1 application topically as needed.  Marland Kitchen lisinopril (PRINIVIL,ZESTRIL) 20 MG tablet Take 20 mg by mouth at bedtime.   . metFORMIN (GLUCOPHAGE) 500 MG tablet Take 1,000 mg by mouth 2 (two) times daily with a meal. 1 at lunch time and 1 at bedtime  . ondansetron (ZOFRAN) 8 MG tablet Take 1 tablet (8 mg total) by mouth every 8 (eight) hours as needed for nausea.  . prochlorperazine (COMPAZINE) 10 MG tablet Take 1 tablet (10 mg total) by mouth every 6 (six) hours as needed for nausea or vomiting.   No facility-administered encounter medications on file as of 05/25/2018.    No Known Allergies  Past Medical History:  Diagnosis Date  . Arthritis   . Asthma   . Complication of anesthesia   . 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 GERD (gastroesophageal reflux disease)   . Hypertension   . PONV (postoperative nausea and vomiting)    Past Surgical History:  Procedure Laterality Date  . IR IMAGING GUIDED PORT INSERTION  05/08/2018  . LAPAROSCOPIC SALPINGOOPHERECTOMY Right 2019   torsion  . ROBOTIC ASSISTED TOTAL  HYSTERECTOMY WITH BILATERAL SALPINGO OOPHERECTOMY N/A 04/24/2018   Procedure: XI ROBOTIC  DIAGNOSTIC LAPAROSCOPY WITH BIOPSY.;  Surgeon: [name], MD;  Location: [facility];  Service: Gynecology;  Laterality: N/A;        Past Gynecological History:   GYNECOLOGIC HISTORY:  . No LMP recorded. (Menstrual status: Irregular Periods).  . Menarche: 37 years old . P 0 . Contraceptive none . HRT N/A  . Last Pap sure Family Hx:  Family History  Problem Relation Age of Onset  . Diabetes Mother   . Cervical cancer Mother   . Heart attack Father   . Diabetes Brother    Social Hx:  Marland Kitchen Tobacco use: None . Alcohol use: None . Illicit Drug use: None . Illicit IV Drug use: Never    Review of Systems: Review of Systems  Genitourinary: Positive for vaginal bleeding.   All other systems reviewed and are negative.  Vitals: Blood pressure 121/87, pulse 98, temperature 98.7 F (37.1 C), temperature source Oral, resp. rate 20, height 5'  1.5" (1.562 m), weight 166 lb 12.8 oz (75.7 kg), SpO2 100 %. There were no vitals filed for this visit.  There were no vitals filed for this visit. There is no height or weight on file to calculate BMI.   Physical Exam:  General :  Well developed, 37 y.o., female in no apparent distress HEENT:  Normocephalic/atraumatic, symmetric, EOMI, eyelids normal Neck:   No visible masses.  Respiratory:  Respirations unlabored, no use of accessory muscles CV:   Deferred Breast:  Deferred Musculoskeletal: Normal muscle strength. Abdomen:  Trocar sites are CDI. There is a residual rashlike effect circumferential around each incision. No visible masses or protrusion Extremities:  No visible edema or deformities Skin:   Normal inspection Neuro/Psych:  No focal motor deficit, no abnormal mental status. Normal gait. Normal affect. Alert and oriented to person, place, and time      Assessment  Locally advanced (pelvic peritoneum) Endometrial cancer  Plan  1. We  previously discussed referral to genetics today given her young age. She agreed and would like to proceed. 2. Management discussion ? Continue neoadjuvant chemotherapy. ? She is locally advanced based on the information we have, so surgery is not unreasonable at some point during her treatment course. ? We may want to consider brachytherapy given the possible parametrial involvement  3. Given the question of parametrial disease she will need an MRI in addition to the [place] prior to surgical resection. 4. Plan pending whether to complete 3 or 6 cycles of chemo before proceeding to surgery. For now I will plan to see her after Cycle 3 unless we decide to wait until after chemo   15  minutes of direct face to face counseling time was spent with the patient. This included discussion about prognosis, therapy recommendations including chemotherapy and treatment planningthat are beyond the scope of routine postoperative care.  [name], MD  05/24/2018, 4:24 PM    Cc: Albertina Parr, MD (Referring Ob/Gyn) Monico Blitz, MD (PCP)

## 2018-05-25 ENCOUNTER — Encounter: Payer: Self-pay | Admitting: Obstetrics

## 2018-05-25 ENCOUNTER — Inpatient Hospital Stay: Payer: BLUE CROSS/BLUE SHIELD | Attending: Obstetrics | Admitting: Obstetrics

## 2018-05-25 VITALS — BP 144/95 | HR 114 | Temp 98.2°F | Resp 20 | Ht 61.5 in | Wt 171.0 lb

## 2018-05-25 DIAGNOSIS — C541 Malignant neoplasm of endometrium: Secondary | ICD-10-CM | POA: Insufficient documentation

## 2018-05-25 DIAGNOSIS — C786 Secondary malignant neoplasm of retroperitoneum and peritoneum: Secondary | ICD-10-CM | POA: Insufficient documentation

## 2018-05-25 DIAGNOSIS — Z9889 Other specified postprocedural states: Secondary | ICD-10-CM | POA: Insufficient documentation

## 2018-05-25 NOTE — Patient Instructions (Signed)
1. Return to see Dr. Gerarda Fraction end of October 2. Potentially we will be getting an MRI prior to that visit, so call to confirm 2 weeks beforehand if you need the MRI ordered.

## 2018-05-29 ENCOUNTER — Encounter: Payer: Self-pay | Admitting: Hematology and Oncology

## 2018-05-29 ENCOUNTER — Inpatient Hospital Stay: Payer: BLUE CROSS/BLUE SHIELD

## 2018-05-29 ENCOUNTER — Inpatient Hospital Stay (HOSPITAL_BASED_OUTPATIENT_CLINIC_OR_DEPARTMENT_OTHER): Payer: BLUE CROSS/BLUE SHIELD | Admitting: Hematology and Oncology

## 2018-05-29 ENCOUNTER — Telehealth: Payer: Self-pay | Admitting: Hematology and Oncology

## 2018-05-29 VITALS — HR 124

## 2018-05-29 DIAGNOSIS — C541 Malignant neoplasm of endometrium: Secondary | ICD-10-CM

## 2018-05-29 DIAGNOSIS — E119 Type 2 diabetes mellitus without complications: Secondary | ICD-10-CM | POA: Diagnosis not present

## 2018-05-29 DIAGNOSIS — R7989 Other specified abnormal findings of blood chemistry: Secondary | ICD-10-CM | POA: Diagnosis not present

## 2018-05-29 LAB — COMPREHENSIVE METABOLIC PANEL
ALBUMIN: 4 g/dL (ref 3.5–5.0)
ALK PHOS: 73 U/L (ref 38–126)
ALT: 26 U/L (ref 0–44)
ANION GAP: 22 — AB (ref 5–15)
AST: 14 U/L — AB (ref 15–41)
BILIRUBIN TOTAL: 0.3 mg/dL (ref 0.3–1.2)
BUN: 27 mg/dL — AB (ref 6–20)
CO2: 18 mmol/L — AB (ref 22–32)
Calcium: 10.8 mg/dL — ABNORMAL HIGH (ref 8.9–10.3)
Chloride: 103 mmol/L (ref 98–111)
Creatinine, Ser: 1.06 mg/dL — ABNORMAL HIGH (ref 0.44–1.00)
GFR calc Af Amer: >60 mL/min (ref >60)
GFR calc non Af Amer: >60 mL/min (ref >60)
GLUCOSE: 439 mg/dL — AB (ref 70–99)
POTASSIUM: 4.7 mmol/L (ref 3.5–5.1)
SODIUM: 143 mmol/L (ref 135–145)
TOTAL PROTEIN: 7.9 g/dL (ref 6.5–8.1)

## 2018-05-29 LAB — CBC WITH DIFFERENTIAL/PLATELET
BASOS ABS: 0 10*3/uL (ref 0.0–0.1)
BASOS PCT: 0 %
EOS ABS: 0 10*3/uL (ref 0.0–0.5)
Eosinophils Relative: 0 %
HEMATOCRIT: 41.8 % (ref 34.8–46.6)
HEMOGLOBIN: 13.7 g/dL (ref 11.6–15.9)
Lymphocytes Relative: 7 %
Lymphs Abs: 1.1 10*3/uL (ref 0.9–3.3)
MCH: 29.9 pg (ref 25.1–34.0)
MCHC: 32.8 g/dL (ref 31.5–36.0)
MCV: 91.3 fL (ref 79.5–101.0)
Monocytes Absolute: 0.3 10*3/uL (ref 0.1–0.9)
Monocytes Relative: 2 %
NEUTROS ABS: 13.8 10*3/uL — AB (ref 1.5–6.5)
NEUTROS PCT: 91 %
Platelets: 197 10*3/uL (ref 145–400)
RBC: 4.58 MIL/uL (ref 3.70–5.45)
RDW: 16.2 % — ABNORMAL HIGH (ref 11.2–14.5)
WBC: 15.2 10*3/uL — ABNORMAL HIGH (ref 3.9–10.3)

## 2018-05-29 MED ORDER — FAMOTIDINE IN NACL 20-0.9 MG/50ML-% IV SOLN
INTRAVENOUS | Status: AC
  Filled 2018-05-29: qty 50

## 2018-05-29 MED ORDER — FAMOTIDINE IN NACL 20-0.9 MG/50ML-% IV SOLN
20.0000 mg | Freq: Once | INTRAVENOUS | Status: AC
  Administered 2018-05-29: 20 mg via INTRAVENOUS

## 2018-05-29 MED ORDER — SODIUM CHLORIDE 0.9 % IV SOLN
670.8000 mg | Freq: Once | INTRAVENOUS | Status: AC
  Administered 2018-05-29: 670 mg via INTRAVENOUS
  Filled 2018-05-29: qty 67

## 2018-05-29 MED ORDER — DIPHENHYDRAMINE HCL 50 MG/ML IJ SOLN
INTRAMUSCULAR | Status: AC
  Filled 2018-05-29: qty 1

## 2018-05-29 MED ORDER — SODIUM CHLORIDE 0.9 % IV SOLN
140.0000 mg/m2 | Freq: Once | INTRAVENOUS | Status: AC
  Administered 2018-05-29: 252 mg via INTRAVENOUS
  Filled 2018-05-29: qty 42

## 2018-05-29 MED ORDER — INSULIN REGULAR HUMAN 100 UNIT/ML IJ SOLN
15.0000 [IU] | Freq: Once | INTRAMUSCULAR | Status: AC
  Administered 2018-05-29: 15 [IU] via SUBCUTANEOUS
  Filled 2018-05-29: qty 10

## 2018-05-29 MED ORDER — SODIUM CHLORIDE 0.9% FLUSH
10.0000 mL | INTRAVENOUS | Status: DC | PRN
  Administered 2018-05-29: 10 mL
  Filled 2018-05-29: qty 10

## 2018-05-29 MED ORDER — PALONOSETRON HCL INJECTION 0.25 MG/5ML
0.2500 mg | Freq: Once | INTRAVENOUS | Status: AC
  Administered 2018-05-29: 0.25 mg via INTRAVENOUS

## 2018-05-29 MED ORDER — DIPHENHYDRAMINE HCL 50 MG/ML IJ SOLN
50.0000 mg | Freq: Once | INTRAMUSCULAR | Status: AC
  Administered 2018-05-29: 50 mg via INTRAVENOUS

## 2018-05-29 MED ORDER — HEPARIN SOD (PORK) LOCK FLUSH 100 UNIT/ML IV SOLN
500.0000 [IU] | Freq: Once | INTRAVENOUS | Status: AC | PRN
  Administered 2018-05-29: 500 [IU]
  Filled 2018-05-29: qty 5

## 2018-05-29 MED ORDER — SODIUM CHLORIDE 0.9 % IV SOLN
Freq: Once | INTRAVENOUS | Status: AC
  Administered 2018-05-29: 11:00:00 via INTRAVENOUS
  Filled 2018-05-29: qty 5

## 2018-05-29 MED ORDER — PALONOSETRON HCL INJECTION 0.25 MG/5ML
INTRAVENOUS | Status: AC
  Filled 2018-05-29: qty 5

## 2018-05-29 MED ORDER — SODIUM CHLORIDE 0.9% FLUSH
10.0000 mL | Freq: Once | INTRAVENOUS | Status: AC
  Administered 2018-05-29: 10 mL
  Filled 2018-05-29: qty 10

## 2018-05-29 MED ORDER — SODIUM CHLORIDE 0.9 % IV SOLN
Freq: Once | INTRAVENOUS | Status: AC
  Administered 2018-05-29: 11:00:00 via INTRAVENOUS
  Filled 2018-05-29: qty 250

## 2018-05-29 NOTE — Assessment & Plan Note (Signed)
Her serum creatinine is mildly elevated.  She is tachycardic and her serum calcium is mildly elevated I suspect this is due to severe dehydration related to severe hypoglycemia I recommend increase oral fluid intake and I will plan to adjust the dose of carboplatin due to changes in serum creatinine.

## 2018-05-29 NOTE — Patient Instructions (Signed)
[facility] Discharge Instructions for Patients Receiving Chemotherapy  Today you received the following chemotherapy agents: Carboplatin and Taxol  To help prevent nausea and vomiting after your treatment, we encourage you to take your nausea medication as directed   If you develop nausea and vomiting that is not controlled by your nausea medication, call the clinic.   BELOW ARE SYMPTOMS THAT SHOULD BE REPORTED IMMEDIATELY:  *FEVER GREATER THAN 100.5 F  *CHILLS WITH OR WITHOUT FEVER  NAUSEA AND VOMITING THAT IS NOT CONTROLLED WITH YOUR NAUSEA MEDICATION  *UNUSUAL SHORTNESS OF BREATH  *UNUSUAL BRUISING OR BLEEDING  TENDERNESS IN MOUTH AND THROAT WITH OR WITHOUT PRESENCE OF ULCERS  *URINARY PROBLEMS  *BOWEL PROBLEMS  UNUSUAL RASH Items with * indicate a potential emergency and should be followed up as soon as possible.  Feel free to call the clinic should you have any questions or concerns. The clinic phone number is [phone].  Please show the CHEMO ALERT CARD at check-in to the Emergency Department and triage nurse.   

## 2018-05-29 NOTE — Progress Notes (Signed)
Patient came in to discuss financial assistance.  Discussed one-time $400 [place] and qulaifications. Advised patient to bring proof of income on 10/15 to apply, will also check on [employer] wich would give her an additional $400 if approved.  Gave her the [place] application for patients whom live in [place].  Gave her the [facility] [employer] [place] application for once her balance exceeds $5000 and advised she must apply for Medicaid first.   Patient has my card for any additional financial questions or concerns.

## 2018-05-29 NOTE — Assessment & Plan Note (Signed)
She tolerated chemotherapy very well except for severe hyperglycemia and some mild leg cramps I plan to reduce IV dexamethasone and told her to reduce oral dexamethasone before treatment I recommend minimum 3 cycles of chemotherapy before repeat CT imaging.

## 2018-05-29 NOTE — Telephone Encounter (Signed)
Gave pt avs and calendar  °

## 2018-05-29 NOTE — Assessment & Plan Note (Addendum)
She has severe hyperglycemia secondary to steroid therapy As above, I plan to reduce dose of steroids. She will receive 15 units of insulin therapy today.

## 2018-05-29 NOTE — Patient Instructions (Signed)
Please use OP site on Pt. Skin very sensitive

## 2018-05-29 NOTE — Progress Notes (Signed)
[place] OFFICE PROGRESS NOTE  Patient Care Team: Monico Blitz, MD as PCP - General (Internal Medicine)  ASSESSMENT & PLAN:  Endometrial cancer [facility]) She tolerated chemotherapy very well except for severe hyperglycemia and some mild leg cramps I plan to reduce IV dexamethasone and told her to reduce oral dexamethasone before treatment I recommend minimum 3 cycles of chemotherapy before repeat CT imaging.  Diabetes mellitus without complication ([place]) She has severe hyperglycemia secondary to steroid therapy As above, I plan to reduce dose of steroids. She will receive 15 units of insulin therapy today.  Elevated serum creatinine Her serum creatinine is mildly elevated.  She is tachycardic and her serum calcium is mildly elevated I suspect this is due to severe dehydration related to severe hypoglycemia I recommend increase oral fluid intake and I will plan to adjust the dose of carboplatin due to changes in serum creatinine.   No orders of the defined types were placed in this encounter.   INTERVAL HISTORY: Please see below for problem oriented charting. She returns for cycle 2 of chemotherapy Since the last time I saw her, she feels well except for leg cramps She also has severe hyperglycemia at home lasting 2 days She denies nausea or constipation Denies peripheral neuropathy No recent infection.   SUMMARY OF ONCOLOGIC HISTORY:   Endometrial cancer ([place])   04/06/2018 Initial Diagnosis    She presented with severe abdominal pain    04/07/2018 Imaging    She presented to the emergency room after the pain woke her up CT imaging was performed 04/07/2018 [facility].  A 6.5 x 7 x 8.1 complex right adnexal mass was found with solid and fat components consistent with a teratoma.  No lymphadenopathy.  Small to moderate ascites and inflammatory changes were seen in the pelvis follow-up ultrasound was also performed showing concern for right ovarian torsion with a  large ovarian mass cystic and solid components 9.7 cm.  A left ovarian cyst seen 3.4 cm.  Endometrial thickening with hypervascularity and endometrial thickness of 24 mm moderate pelvic free fluid.    04/07/2018 Surgery    Given the concern for torsion she was taken urgently to the operating room by Dr. Orson Ape. McLeod 04/07/2018.  At that time operative laparoscopy, torsion of the right adnexa was noted along with thickened abnormal endometrium.  She had right salpingo-oophorectomy through a laparoscopic approach along with endometrial biopsy.      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 Biopsy of left parametrial tissue and sigmoid peritoneum  Surgeon: Bernita Raisin, MD  Specimens: pelvic washings, parametrial biopsy, sigmoid peritoneum biopsy  Complications: none  Indication for Procedure:  Patient found at OSH to have right ovarian mass that was resected and found on permanent section to be c/w endometrial cancer. Also had endometrial biopsy showing the same.  Operative Findings: Significant adhesive disease almost miliary-like. The sigmoid was adherent to the left cornua. After takedown of the sigmoid in this region  it was clear the left cornua was involved in an abnormal process including necrotic tissue clinically concerning for malignancy. In addition the bladder was adherent to this region. Posterior surface of the broad  ligament in area c/w the parametria on the left showed likely tumor breakthrough. Images were taken. Biopsy from the tissue extruding from the left side of the uterus in the presumed parametrium was sent for frozen. This returned worrisome for disease, but not definitive. A separate area of the sigmoid was adherent inferior to the posterior surface lesion and the remnant of this adhesion was sent as sigmoid peritoneum. This, on frozen, returned with fibrosis.     04/24/2018 Pathology Results    1. Peritoneum, biopsy - METASTATIC CARCINOMA CONSISTENT WITH PATIENT'S CLINICAL HISTORY OF PRIMARY ENDOMETRIAL CARCINOMA. SEE NOTE 2. Soft tissue, biopsy, parametrial - METASTATIC CARCINOMA CONSISTENT WITH PATIENT'S CLINICAL HISTORY OF PRIMARY ENDOMETRIAL CARCINOMA. SEE NOTE Diagnosis Note 1. and 2. Immunohistochemical stains show that the tumor cells are positive for PAX8 and negative for calretinin, consistent with the above diagnosis    04/25/2018 Cancer Staging    Staging form: Corpus Uteri - Carcinoma and Carcinosarcoma, AJCC 8th Edition - Clinical: Stage IVA (cT4, cN0, cM0) - Signed by Heath Lark, MD on 04/25/2018    05/02/2018 Tumor Marker    Patient's tumor was tested for the following markers: CA-125 Results of the tumor marker test revealed 56.7    05/04/2018 -  Chemotherapy    The patient had carboplatin and Taxol    05/08/2018 Procedure    Placement of a power injectable Port-A-Cath     REVIEW OF SYSTEMS:   Constitutional: Denies fevers, chills or abnormal weight loss Eyes: Denies blurriness of vision Ears, nose, mouth, throat, and face: Denies mucositis or sore throat Respiratory: Denies cough, dyspnea or wheezes Cardiovascular: Denies palpitation, chest discomfort or lower extremity swelling Gastrointestinal:  Denies nausea, heartburn or change in bowel habits Skin: Denies abnormal skin rashes Lymphatics: Denies new lymphadenopathy or easy bruising Neurological:Denies numbness,  tingling or new weaknesses Behavioral/Psych: Mood is stable, no new changes  All other systems were reviewed with the patient and are negative.  I have reviewed the past medical history, past surgical history, social history and family history with the patient and they are unchanged from previous note.  ALLERGIES:  is allergic to latex and other.  MEDICATIONS:  Current Outpatient Medications  Medication Sig Dispense Refill  . amLODipine (NORVASC) 5 MG tablet Take 5 mg by mouth at bedtime.     Marland Kitchen dexamethasone (DECADRON) 4 MG tablet Take 3 tabs at the night before and 3 tab the morning of chemotherapy, every 3 weeks, by mouth 36 tablet 0  . empagliflozin (JARDIANCE) 10 MG TABS tablet Take 10 mg by mouth daily.     . insulin degludec (TRESIBA FLEXTOUCH) 100 UNIT/ML SOPN FlexTouch Pen Inject 25 Units into the skin daily.    Marland Kitchen lidocaine-prilocaine (EMLA) cream Apply 1 application topically as needed. 30 g 6  . lisinopril (PRINIVIL,ZESTRIL) 20 MG tablet Take 20 mg by mouth at bedtime.     . metFORMIN (GLUCOPHAGE) 500 MG tablet Take 1,000 mg by mouth 2 (two) times daily with a meal. 1 at lunch time and 1 at bedtime    . ondansetron (ZOFRAN) 8 MG tablet Take 1 tablet (8 mg total) by mouth every 8 (eight) hours as needed for nausea. 30 tablet 3  . prochlorperazine (COMPAZINE) 10 MG tablet Take 1 tablet (10 mg total) by mouth every 6 (six)  hours as needed for nausea or vomiting. 30 tablet 0   No current facility-administered medications for this visit.    Facility-Administered Medications Ordered in Other Visits  Medication Dose Route Frequency Provider Last Rate Last Dose  . insulin regular (NOVOLIN R,HUMULIN R) 100 units/mL injection 15 Units  15 Units Subcutaneous Once Alvy Bimler, Kesha Hurrell, MD        PHYSICAL EXAMINATION: ECOG PERFORMANCE STATUS: 1 - Symptomatic but completely ambulatory  Vitals:   05/29/18 0848  BP: 127/77  Pulse: (!) 134  Resp: 18  Temp: 98.3 F (36.8 C)  SpO2: 100%   Filed  Weights   05/29/18 0848  Weight: 169 lb 6.4 oz (76.8 kg)    GENERAL:alert, no distress and comfortable SKIN: skin color, texture, turgor are normal, no rashes or significant lesions EYES: normal, Conjunctiva are pink and non-injected, sclera clear OROPHARYNX:no exudate, no erythema and lips, buccal mucosa, and tongue normal  NECK: supple, thyroid normal size, non-tender, without nodularity LYMPH:  no palpable lymphadenopathy in the cervical, axillary or inguinal LUNGS: clear to auscultation and percussion with normal breathing effort HEART: regular rate & rhythm and no murmurs and no lower extremity edema ABDOMEN:abdomen soft, non-tender and normal bowel sounds Musculoskeletal:no cyanosis of digits and no clubbing  NEURO: alert & oriented x 3 with fluent speech, no focal motor/sensory deficits  LABORATORY DATA:  I have reviewed the data as listed    Component Value Date/Time   NA 143 05/29/2018 0805   K 4.7 05/29/2018 0805   CL 103 05/29/2018 0805   CO2 18 (L) 05/29/2018 0805   GLUCOSE 439 (H) 05/29/2018 0805   BUN 27 (H) 05/29/2018 0805   CREATININE 1.06 (H) 05/29/2018 0805   CALCIUM 10.8 (H) 05/29/2018 0805   PROT 7.9 05/29/2018 0805   ALBUMIN 4.0 05/29/2018 0805   AST 14 (L) 05/29/2018 0805   ALT 26 05/29/2018 0805   ALKPHOS 73 05/29/2018 0805   BILITOT 0.3 05/29/2018 0805   GFRNONAA >60 05/29/2018 0805   GFRAA >60 05/29/2018 0805    No results found for: SPEP, UPEP  Lab Results  Component Value Date   WBC 15.2 (H) 05/29/2018   NEUTROABS 13.8 (H) 05/29/2018   HGB 13.7 05/29/2018   HCT 41.8 05/29/2018   MCV 91.3 05/29/2018   PLT 197 05/29/2018      Chemistry      Component Value Date/Time   NA 143 05/29/2018 0805   K 4.7 05/29/2018 0805   CL 103 05/29/2018 0805   CO2 18 (L) 05/29/2018 0805   BUN 27 (H) 05/29/2018 0805   CREATININE 1.06 (H) 05/29/2018 0805      Component Value Date/Time   CALCIUM 10.8 (H) 05/29/2018 0805   ALKPHOS 73 05/29/2018 0805    AST 14 (L) 05/29/2018 0805   ALT 26 05/29/2018 0805   BILITOT 0.3 05/29/2018 0805       RADIOGRAPHIC STUDIES: I have personally reviewed the radiological images as listed and agreed with the findings in the report. Ir Imaging Guided Port Insertion  Result Date: 05/08/2018 INDICATION: 37 year old with endometrial cancer. Port-A-Cath needed for therapy. EXAM: FLUOROSCOPIC AND ULTRASOUND GUIDED PLACEMENT OF A SUBCUTANEOUS PORT COMPARISON:  None. MEDICATIONS: Ancef 2 g; The antibiotic was administered within an appropriate time interval prior to skin puncture. ANESTHESIA/SEDATION: Versed 2 mg IV; Fentanyl 100 mcg IV; Ativan 0.5 mg sublingual Moderate Sedation Time:  40 The patient was continuously monitored during the procedure by the interventional radiology nurse under my direct supervision. FLUOROSCOPY TIME:  Two minutes, 6 seconds (8 mGy) COMPLICATIONS: None immediate. PROCEDURE: The procedure, risks, benefits, and alternatives were explained to the patient. Questions regarding the procedure were encouraged and answered. The patient understands and consents to the procedure. Patient was placed supine on the interventional table. Ultrasound confirmed a patent right internal jugular vein. Ultrasound image was saved for documentation. The right chest and neck were cleaned with a skin antiseptic and a sterile drape was placed. Maximal barrier sterile technique was utilized including caps, mask, sterile gowns, sterile gloves, sterile drape, hand hygiene and skin antiseptic. The right neck was anesthetized with 1% lidocaine. Small incision was made in the right neck with a blade. Micropuncture set was placed in the right internal jugular vein with ultrasound guidance. The micropuncture wire was used for measurement purposes. The right chest was anesthetized with 1% lidocaine with epinephrine. #15 blade was used to make an incision and a subcutaneous port pocket was formed. [place] was assembled.  Subcutaneous tunnel was formed with a stiff tunneling device. The port catheter was brought through the subcutaneous tunnel. The port was placed in the subcutaneous pocket. The micropuncture set was exchanged for a peel-away sheath. The catheter was placed through the peel-away sheath and the tip was positioned at the superior cavoatrial junction. However, the catheter was kinked near the neck. While trying to fix the kink, the catheter was pulling out of the SVC. Attempted to position the catheter over a wire but the catheter continued to pull out of the SVC. As a result, this port was completely removed. The right internal jugular vein was punctured again with a new micropuncture set using ultrasound guidance. Micropuncture dilator set was placed. A new 8 French large profile port was selected. Port was tunneled through the subcutaneous tissue. Micropuncture catheter was exchanged for a peel-away sheath over a J wire. Catheter was cut to an appropriate length and advanced through the peel-away sheath. Catheter tip placed at the junction of the superior vena cava and right atrium. Port was accessed and found to aspirate and flush well. Heparinized saline was placed in the port. Catheter was sutured to the subcutaneous pocket. The port pocket was closed using two layers of absorbable sutures and Dermabond. The vein skin site was closed using a single layer of absorbable suture and Dermabond. Sterile dressings were applied. Patient tolerated the procedure well without an immediate complication. Ultrasound and fluoroscopic images were taken and saved for this procedure. FINDINGS: Catheter tip at the junction of superior vena cava and right atrium. IMPRESSION: Placement of a power injectable Port-A-Cath. Electronically Signed   By: Markus Daft M.D.   On: 05/08/2018 10:37    All questions were answered. The patient knows to call the clinic with any problems, questions or concerns. No barriers to learning was  detected.  I spent 25 minutes counseling the patient face to face. The total time spent in the appointment was 30 minutes and more than 50% was on counseling and review of test results  Heath Lark, MD 05/29/2018 10:25 AM

## 2018-05-29 NOTE — Progress Notes (Signed)
Per Dr. Calton Dach office note from today, tachycardia addressed.

## 2018-05-30 ENCOUNTER — Telehealth: Payer: Self-pay | Admitting: *Deleted

## 2018-05-30 LAB — CA 125: Cancer Antigen (CA) 125: 31.6 U/mL (ref 0.0–38.1)

## 2018-05-30 NOTE — Telephone Encounter (Signed)
Called and scheduled the patient for genetics

## 2018-06-06 ENCOUNTER — Other Ambulatory Visit: Payer: Self-pay | Admitting: Hematology and Oncology

## 2018-06-19 ENCOUNTER — Encounter: Payer: Self-pay | Admitting: Hematology and Oncology

## 2018-06-19 ENCOUNTER — Inpatient Hospital Stay: Payer: BLUE CROSS/BLUE SHIELD

## 2018-06-19 ENCOUNTER — Other Ambulatory Visit: Payer: Self-pay | Admitting: Hematology and Oncology

## 2018-06-19 ENCOUNTER — Inpatient Hospital Stay: Payer: BLUE CROSS/BLUE SHIELD | Attending: Obstetrics

## 2018-06-19 ENCOUNTER — Telehealth: Payer: Self-pay

## 2018-06-19 ENCOUNTER — Inpatient Hospital Stay (HOSPITAL_BASED_OUTPATIENT_CLINIC_OR_DEPARTMENT_OTHER): Payer: BLUE CROSS/BLUE SHIELD | Admitting: Hematology and Oncology

## 2018-06-19 DIAGNOSIS — C7961 Secondary malignant neoplasm of right ovary: Secondary | ICD-10-CM

## 2018-06-19 DIAGNOSIS — E119 Type 2 diabetes mellitus without complications: Secondary | ICD-10-CM | POA: Diagnosis not present

## 2018-06-19 DIAGNOSIS — Z90722 Acquired absence of ovaries, bilateral: Secondary | ICD-10-CM | POA: Insufficient documentation

## 2018-06-19 DIAGNOSIS — N289 Disorder of kidney and ureter, unspecified: Secondary | ICD-10-CM | POA: Insufficient documentation

## 2018-06-19 DIAGNOSIS — R000 Tachycardia, unspecified: Secondary | ICD-10-CM | POA: Insufficient documentation

## 2018-06-19 DIAGNOSIS — M791 Myalgia, unspecified site: Secondary | ICD-10-CM | POA: Insufficient documentation

## 2018-06-19 DIAGNOSIS — R7989 Other specified abnormal findings of blood chemistry: Secondary | ICD-10-CM

## 2018-06-19 DIAGNOSIS — C786 Secondary malignant neoplasm of retroperitoneum and peritoneum: Secondary | ICD-10-CM | POA: Insufficient documentation

## 2018-06-19 DIAGNOSIS — C541 Malignant neoplasm of endometrium: Secondary | ICD-10-CM

## 2018-06-19 DIAGNOSIS — Z5111 Encounter for antineoplastic chemotherapy: Secondary | ICD-10-CM | POA: Diagnosis present

## 2018-06-19 DIAGNOSIS — E1165 Type 2 diabetes mellitus with hyperglycemia: Secondary | ICD-10-CM | POA: Insufficient documentation

## 2018-06-19 LAB — CBC WITH DIFFERENTIAL/PLATELET
ABS IMMATURE GRANULOCYTES: 0.11 10*3/uL — AB (ref 0.00–0.07)
BASOS ABS: 0 10*3/uL (ref 0.0–0.1)
Basophils Relative: 0 %
EOS PCT: 0 %
Eosinophils Absolute: 0 10*3/uL (ref 0.0–0.5)
HEMATOCRIT: 39.2 % (ref 36.0–46.0)
HEMOGLOBIN: 13.1 g/dL (ref 12.0–15.0)
Immature Granulocytes: 1 %
LYMPHS ABS: 0.9 10*3/uL (ref 0.7–4.0)
LYMPHS PCT: 8 %
MCH: 30.6 pg (ref 26.0–34.0)
MCHC: 33.4 g/dL (ref 30.0–36.0)
MCV: 91.6 fL (ref 80.0–100.0)
MONO ABS: 0.2 10*3/uL (ref 0.1–1.0)
Monocytes Relative: 2 %
Neutro Abs: 9.7 10*3/uL — ABNORMAL HIGH (ref 1.7–7.7)
Neutrophils Relative %: 89 %
Platelets: 184 10*3/uL (ref 150–400)
RBC: 4.28 MIL/uL (ref 3.87–5.11)
RDW: 16.6 % — ABNORMAL HIGH (ref 11.5–15.5)
WBC: 10.9 10*3/uL — ABNORMAL HIGH (ref 4.0–10.5)
nRBC: 0 % (ref 0.0–0.2)

## 2018-06-19 LAB — COMPREHENSIVE METABOLIC PANEL
ALBUMIN: 4 g/dL (ref 3.5–5.0)
ALT: 25 U/L (ref 0–44)
ANION GAP: 19 — AB (ref 5–15)
AST: 14 U/L — AB (ref 15–41)
Alkaline Phosphatase: 68 U/L (ref 38–126)
BILIRUBIN TOTAL: 0.3 mg/dL (ref 0.3–1.2)
BUN: 23 mg/dL — AB (ref 6–20)
CO2: 20 mmol/L — AB (ref 22–32)
Calcium: 10.6 mg/dL — ABNORMAL HIGH (ref 8.9–10.3)
Chloride: 102 mmol/L (ref 98–111)
Creatinine, Ser: 1.05 mg/dL — ABNORMAL HIGH (ref 0.44–1.00)
GFR calc Af Amer: >60 mL/min (ref >60)
GFR calc non Af Amer: >60 mL/min (ref >60)
GLUCOSE: 389 mg/dL — AB (ref 70–99)
POTASSIUM: 4.6 mmol/L (ref 3.5–5.1)
Sodium: 141 mmol/L (ref 135–145)
Total Protein: 7.9 g/dL (ref 6.5–8.1)

## 2018-06-19 MED ORDER — SODIUM CHLORIDE 0.9% FLUSH
10.0000 mL | INTRAVENOUS | Status: DC | PRN
  Administered 2018-06-19: 10 mL
  Filled 2018-06-19: qty 10

## 2018-06-19 MED ORDER — DIPHENHYDRAMINE HCL 50 MG/ML IJ SOLN
50.0000 mg | Freq: Once | INTRAMUSCULAR | Status: AC
  Administered 2018-06-19: 50 mg via INTRAVENOUS

## 2018-06-19 MED ORDER — FAMOTIDINE IN NACL 20-0.9 MG/50ML-% IV SOLN
20.0000 mg | Freq: Once | INTRAVENOUS | Status: AC
  Administered 2018-06-19: 20 mg via INTRAVENOUS

## 2018-06-19 MED ORDER — SODIUM CHLORIDE 0.9 % IV SOLN
Freq: Once | INTRAVENOUS | Status: AC
  Administered 2018-06-19: 13:00:00 via INTRAVENOUS
  Filled 2018-06-19: qty 250

## 2018-06-19 MED ORDER — FAMOTIDINE IN NACL 20-0.9 MG/50ML-% IV SOLN
INTRAVENOUS | Status: AC
  Filled 2018-06-19: qty 50

## 2018-06-19 MED ORDER — SODIUM CHLORIDE 0.9 % IV SOLN
Freq: Once | INTRAVENOUS | Status: AC
  Administered 2018-06-19: 13:00:00 via INTRAVENOUS
  Filled 2018-06-19: qty 5

## 2018-06-19 MED ORDER — PALONOSETRON HCL INJECTION 0.25 MG/5ML
INTRAVENOUS | Status: AC
  Filled 2018-06-19: qty 5

## 2018-06-19 MED ORDER — SODIUM CHLORIDE 0.9 % IV SOLN
675.6000 mg | Freq: Once | INTRAVENOUS | Status: AC
  Administered 2018-06-19: 680 mg via INTRAVENOUS
  Filled 2018-06-19: qty 68

## 2018-06-19 MED ORDER — INSULIN REGULAR HUMAN 100 UNIT/ML IJ SOLN
15.0000 [IU] | Freq: Once | INTRAMUSCULAR | Status: AC
  Administered 2018-06-19: 15 [IU] via SUBCUTANEOUS
  Filled 2018-06-19: qty 10

## 2018-06-19 MED ORDER — DIPHENHYDRAMINE HCL 50 MG/ML IJ SOLN
INTRAMUSCULAR | Status: AC
  Filled 2018-06-19: qty 1

## 2018-06-19 MED ORDER — HEPARIN SOD (PORK) LOCK FLUSH 100 UNIT/ML IV SOLN
500.0000 [IU] | Freq: Once | INTRAVENOUS | Status: AC | PRN
  Administered 2018-06-19: 500 [IU]
  Filled 2018-06-19: qty 5

## 2018-06-19 MED ORDER — SODIUM CHLORIDE 0.9 % IV SOLN
140.0000 mg/m2 | Freq: Once | INTRAVENOUS | Status: AC
  Administered 2018-06-19: 252 mg via INTRAVENOUS
  Filled 2018-06-19: qty 42

## 2018-06-19 MED ORDER — PALONOSETRON HCL INJECTION 0.25 MG/5ML
0.2500 mg | Freq: Once | INTRAVENOUS | Status: AC
  Administered 2018-06-19: 0.25 mg via INTRAVENOUS

## 2018-06-19 NOTE — Assessment & Plan Note (Signed)
She tolerated chemotherapy very well except for severe hyperglycemia I plan to continue on reduced oral dexamethasone before treatment I recommend repeat CT imaging before she sees her GYN Public affairs consultant.

## 2018-06-19 NOTE — Telephone Encounter (Signed)
-----   Message from Heath Lark, MD sent at 06/19/2018  1:50 PM EDT ----- Regarding: CT only pls tell her Dr. Gaynelle Arabian recommend CT only for now MRI maybe later

## 2018-06-19 NOTE — Telephone Encounter (Signed)
Given below message to patient in the infusion room. She verbalized understanding.

## 2018-06-19 NOTE — Assessment & Plan Note (Signed)
This is due to severe dehydration. I recommend increase oral fluid as tolerated

## 2018-06-19 NOTE — Assessment & Plan Note (Signed)
Her serum creatinine is mildly elevated.  She is tachycardic and her serum calcium is mildly elevated I suspect this is due to severe dehydration related to severe hyperglycemia I recommend increase oral fluid intake

## 2018-06-19 NOTE — Progress Notes (Signed)
[place] OFFICE PROGRESS NOTE  Patient Care Team: Monico Blitz, MD as PCP - General (Internal Medicine)  ASSESSMENT & PLAN:  Endometrial cancer [facility]) She tolerated chemotherapy very well except for severe hyperglycemia I plan to continue on reduced oral dexamethasone before treatment I recommend repeat CT imaging before she sees her GYN Public affairs consultant.  Diabetes mellitus without complication ([place]) She has severe hyperglycemia secondary to steroid therapy As above, I plan to reduce dose of steroids. She will receive 15 units of insulin therapy today.   Tachycardia This is due to severe dehydration. I recommend increase oral fluid as tolerated  Elevated serum creatinine Her serum creatinine is mildly elevated.  She is tachycardic and her serum calcium is mildly elevated I suspect this is due to severe dehydration related to severe hyperglycemia I recommend increase oral fluid intake    Orders Placed This Encounter  Procedures  . CT ABDOMEN PELVIS W CONTRAST    Standing Status:   Future    Standing Expiration Date:   06/20/2019    Order Specific Question:   If indicated for the ordered procedure, I authorize the administration of contrast media per Radiology protocol    Answer:   Yes    Order Specific Question:   Preferred imaging location?    Answer:   [facility]    Order Specific Question:   Radiology Contrast Protocol - do NOT remove file path    Answer:   [email or web address]    Order Specific Question:   Is patient pregnant?    Answer:   No    INTERVAL HISTORY: Please see below for problem oriented charting. She returns for further follow-up, to be seen prior to cycle 3 of chemo She denies nausea or vomiting She has mild heartburn and diarrhea She denies peripheral neuropathy from treatment No recent infection No abdominal bloating  SUMMARY OF ONCOLOGIC HISTORY:   Endometrial cancer ([place])   04/06/2018 Initial  Diagnosis    She presented with severe abdominal pain    04/07/2018 Imaging    She presented to the emergency room after the pain woke her up CT imaging was performed 04/07/2018 [facility].  A 6.5 x 7 x 8.1 complex right adnexal mass was found with solid and fat components consistent with a teratoma.  No lymphadenopathy.  Small to moderate ascites and inflammatory changes were seen in the pelvis follow-up ultrasound was also performed showing concern for right ovarian torsion with a large ovarian mass cystic and solid components 9.7 cm.  A left ovarian cyst seen 3.4 cm.  Endometrial thickening with hypervascularity and endometrial thickness of 24 mm moderate pelvic free fluid.    04/07/2018 Surgery    Given the concern for torsion she was taken urgently to the operating room by Dr. Orson Ape. McLeod 04/07/2018.  At that time operative laparoscopy, torsion of the right adnexa was noted along with thickened abnormal endometrium.  She had right salpingo-oophorectomy through a laparoscopic approach along with endometrial biopsy.      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 Biopsy of left parametrial tissue and sigmoid peritoneum  Surgeon: Bernita Raisin, MD  Specimens: pelvic washings, parametrial biopsy, sigmoid peritoneum biopsy  Complications: none  Indication for Procedure:  Patient found at OSH to have right ovarian mass that was  resected and found on permanent section to be c/w endometrial cancer. Also had endometrial biopsy showing the same.  Operative Findings: Significant adhesive disease almost miliary-like. The sigmoid was adherent to the left cornua. After takedown of the sigmoid in this region it was clear the left cornua was involved in an abnormal process including necrotic tissue clinically concerning for malignancy. In addition the bladder was adherent to this region. Posterior surface of the broad ligament in area c/w the parametria on the left showed likely tumor breakthrough. Images were taken. Biopsy from the tissue extruding from the left side of the uterus in the presumed parametrium was sent for frozen. This returned worrisome for disease, but not definitive. A separate area of the sigmoid was adherent inferior to the posterior surface lesion and the remnant of this adhesion was sent as sigmoid peritoneum. This, on frozen, returned with fibrosis.     04/24/2018 Pathology Results    1. Peritoneum, biopsy - METASTATIC CARCINOMA CONSISTENT WITH PATIENT'S CLINICAL HISTORY OF PRIMARY ENDOMETRIAL CARCINOMA. SEE NOTE 2. Soft tissue, biopsy, parametrial - METASTATIC CARCINOMA CONSISTENT WITH PATIENT'S CLINICAL HISTORY OF PRIMARY ENDOMETRIAL CARCINOMA. SEE NOTE Diagnosis Note 1. and 2. Immunohistochemical stains show that the tumor cells are positive for PAX8 and negative for calretinin, consistent with the above diagnosis    04/25/2018 Cancer Staging    Staging form: Corpus Uteri - Carcinoma and Carcinosarcoma, AJCC 8th Edition - Clinical: Stage IVA (cT4, cN0, cM0) - Signed by Heath Lark, MD on 04/25/2018    05/02/2018 Tumor Marker    Patient's tumor was tested for the following markers: CA-125 Results of the tumor marker test revealed 56.7    05/04/2018 -  Chemotherapy    The patient had carboplatin and Taxol    05/08/2018 Procedure    Placement of a power injectable Port-A-Cath    05/29/2018 Tumor Marker     Patient's tumor was tested for the following markers: CA-125 Results of the tumor marker test revealed 31.6     REVIEW OF SYSTEMS:   Constitutional: Denies fevers, chills or abnormal weight loss Eyes: Denies blurriness of vision Ears, nose, mouth, throat, and face: Denies mucositis or sore throat Respiratory: Denies cough, dyspnea or wheezes Cardiovascular: Denies palpitation, chest discomfort or lower extremity swelling Gastrointestinal:  Denies nausea, heartburn or change in bowel habits Skin: Denies abnormal skin rashes Lymphatics: Denies new lymphadenopathy or easy bruising Neurological:Denies numbness, tingling or new weaknesses Behavioral/Psych: Mood is stable, no new changes  All other systems were reviewed with the patient and are negative.  I have reviewed the past medical history, past surgical history, social history and family history with the patient and they are unchanged from previous note.  ALLERGIES:  is allergic to latex and other.  MEDICATIONS:  Current Outpatient Medications  Medication Sig Dispense Refill  . amLODipine (NORVASC) 5 MG tablet Take 5 mg by mouth at bedtime.     Marland Kitchen dexamethasone (DECADRON) 4 MG tablet Take 3 tabs at the night before and 3 tab the morning of chemotherapy, every 3 weeks, by mouth 36 tablet 0  . empagliflozin (JARDIANCE) 10 MG TABS tablet Take 10 mg by mouth daily.     Marland Kitchen  insulin degludec (TRESIBA FLEXTOUCH) 100 UNIT/ML SOPN FlexTouch Pen Inject 25 Units into the skin daily.    Marland Kitchen lidocaine-prilocaine (EMLA) cream Apply 1 application topically as needed. 30 g 6  . lisinopril (PRINIVIL,ZESTRIL) 20 MG tablet Take 20 mg by mouth at bedtime.     . metFORMIN (GLUCOPHAGE) 500 MG tablet Take 1,000 mg by mouth 2 (two) times daily with a meal. 1 at lunch time and 1 at bedtime    . ondansetron (ZOFRAN) 8 MG tablet Take 1 tablet (8 mg total) by mouth every 8 (eight) hours as needed for nausea. 30 tablet 3  . prochlorperazine (COMPAZINE) 10 MG tablet  TAKE 1 TABLET BY MOUTH EVERY 6 HOURS AS NEEDED FOR NAUSEA FOR VOMITING 30 tablet 0   No current facility-administered medications for this visit.    Facility-Administered Medications Ordered in Other Visits  Medication Dose Route Frequency Provider Last Rate Last Dose  . CARBOplatin (PARAPLATIN) 680 mg in sodium chloride 0.9 % 250 mL chemo infusion  680 mg Intravenous Once Alvy Bimler, Albertus Chiarelli, MD      . heparin lock flush 100 unit/mL  500 Units Intracatheter Once PRN Alvy Bimler, Makiah Foye, MD      . PACLitaxel (TAXOL) 252 mg in sodium chloride 0.9 % 250 mL chemo infusion (> 80mg /m2)  140 mg/m2 (Treatment Plan Recorded) Intravenous Once Tyreece Gelles, MD      . sodium chloride flush (NS) 0.9 % injection 10 mL  10 mL Intracatheter PRN Alvy Bimler, Junious Ragone, MD        PHYSICAL EXAMINATION: ECOG PERFORMANCE STATUS: 1 - Symptomatic but completely ambulatory  Vitals:   06/19/18 1143  BP: 125/81  Pulse: (!) 120  Resp: 18  Temp: 98 F (36.7 C)  SpO2: 98%   Filed Weights   06/19/18 1143  Weight: 172 lb 12.8 oz (78.4 kg)    GENERAL:alert, no distress and comfortable SKIN: skin color, texture, turgor are normal, no rashes or significant lesions EYES: normal, Conjunctiva are pink and non-injected, sclera clear OROPHARYNX:no exudate, no erythema and lips, buccal mucosa, and tongue normal  NECK: supple, thyroid normal size, non-tender, without nodularity LYMPH:  no palpable lymphadenopathy in the cervical, axillary or inguinal LUNGS: clear to auscultation and percussion with normal breathing effort HEART: Tachycardic, murmurs and no lower extremity edema ABDOMEN:abdomen soft, non-tender and normal bowel sounds Musculoskeletal:no cyanosis of digits and no clubbing  NEURO: alert & oriented x 3 with fluent speech, no focal motor/sensory deficits  LABORATORY DATA:  I have reviewed the data as listed    Component Value Date/Time   NA 141 06/19/2018 1044   K 4.6 06/19/2018 1044   CL 102 06/19/2018 1044   CO2 20 (L)  06/19/2018 1044   GLUCOSE 389 (H) 06/19/2018 1044   BUN 23 (H) 06/19/2018 1044   CREATININE 1.05 (H) 06/19/2018 1044   CALCIUM 10.6 (H) 06/19/2018 1044   PROT 7.9 06/19/2018 1044   ALBUMIN 4.0 06/19/2018 1044   AST 14 (L) 06/19/2018 1044   ALT 25 06/19/2018 1044   ALKPHOS 68 06/19/2018 1044   BILITOT 0.3 06/19/2018 1044   GFRNONAA >60 06/19/2018 1044   GFRAA >60 06/19/2018 1044    No results found for: SPEP, UPEP  Lab Results  Component Value Date   WBC 10.9 (H) 06/19/2018   NEUTROABS 9.7 (H) 06/19/2018   HGB 13.1 06/19/2018   HCT 39.2 06/19/2018   MCV 91.6 06/19/2018   PLT 184 06/19/2018      Chemistry      Component  Value Date/Time   NA 141 06/19/2018 1044   K 4.6 06/19/2018 1044   CL 102 06/19/2018 1044   CO2 20 (L) 06/19/2018 1044   BUN 23 (H) 06/19/2018 1044   CREATININE 1.05 (H) 06/19/2018 1044      Component Value Date/Time   CALCIUM 10.6 (H) 06/19/2018 1044   ALKPHOS 68 06/19/2018 1044   AST 14 (L) 06/19/2018 1044   ALT 25 06/19/2018 1044   BILITOT 0.3 06/19/2018 1044       All questions were answered. The patient knows to call the clinic with any problems, questions or concerns. No barriers to learning was detected.  I spent 25 minutes counseling the patient face to face. The total time spent in the appointment was 30 minutes and more than 50% was on counseling and review of test results  Heath Lark, MD 06/19/2018 1:48 PM

## 2018-06-19 NOTE — Assessment & Plan Note (Signed)
She has severe hyperglycemia secondary to steroid therapy As above, I plan to reduce dose of steroids. She will receive 15 units of insulin therapy today.

## 2018-06-19 NOTE — Patient Instructions (Signed)
[facility] Discharge Instructions for Patients Receiving Chemotherapy  Today you received the following chemotherapy agents Taxol and Carboplatin.  To help prevent nausea and vomiting after your treatment, we encourage you to take your nausea medication as directed. NO ZOFRAN FOR 3 DAYS AFTER CHEMO.   If you develop nausea and vomiting that is not controlled by your nausea medication, call the clinic.   BELOW ARE SYMPTOMS THAT SHOULD BE REPORTED IMMEDIATELY:  *FEVER GREATER THAN 100.5 F  *CHILLS WITH OR WITHOUT FEVER  NAUSEA AND VOMITING THAT IS NOT CONTROLLED WITH YOUR NAUSEA MEDICATION  *UNUSUAL SHORTNESS OF BREATH  *UNUSUAL BRUISING OR BLEEDING  TENDERNESS IN MOUTH AND THROAT WITH OR WITHOUT PRESENCE OF ULCERS  *URINARY PROBLEMS  *BOWEL PROBLEMS  UNUSUAL RASH Items with * indicate a potential emergency and should be followed up as soon as possible.  Feel free to call the clinic you have any questions or concerns. The clinic phone number is [phone].  Please show the CHEMO ALERT CARD at check-in to the Emergency Department and triage nurse.    

## 2018-06-20 LAB — CA 125: CANCER ANTIGEN (CA) 125: 21.1 U/mL (ref 0.0–38.1)

## 2018-06-25 ENCOUNTER — Telehealth: Payer: Self-pay

## 2018-06-25 ENCOUNTER — Inpatient Hospital Stay (HOSPITAL_BASED_OUTPATIENT_CLINIC_OR_DEPARTMENT_OTHER): Payer: BLUE CROSS/BLUE SHIELD | Admitting: Licensed Clinical Social Worker

## 2018-06-25 ENCOUNTER — Encounter: Payer: Self-pay | Admitting: Licensed Clinical Social Worker

## 2018-06-25 ENCOUNTER — Inpatient Hospital Stay: Payer: BLUE CROSS/BLUE SHIELD

## 2018-06-25 DIAGNOSIS — C541 Malignant neoplasm of endometrium: Secondary | ICD-10-CM

## 2018-06-25 DIAGNOSIS — C7961 Secondary malignant neoplasm of right ovary: Secondary | ICD-10-CM

## 2018-06-25 DIAGNOSIS — Z8049 Family history of malignant neoplasm of other genital organs: Secondary | ICD-10-CM | POA: Insufficient documentation

## 2018-06-25 DIAGNOSIS — Z803 Family history of malignant neoplasm of breast: Secondary | ICD-10-CM | POA: Insufficient documentation

## 2018-06-25 DIAGNOSIS — Z800 Family history of malignant neoplasm of digestive organs: Secondary | ICD-10-CM

## 2018-06-25 DIAGNOSIS — Z8051 Family history of malignant neoplasm of kidney: Secondary | ICD-10-CM

## 2018-06-25 DIAGNOSIS — Z8041 Family history of malignant neoplasm of ovary: Secondary | ICD-10-CM | POA: Insufficient documentation

## 2018-06-25 LAB — COMPREHENSIVE METABOLIC PANEL
ALBUMIN: 4 g/dL (ref 3.5–5.0)
ALK PHOS: 68 U/L (ref 38–126)
ALT: 37 U/L (ref 0–44)
ANION GAP: 18 — AB (ref 5–15)
AST: 16 U/L (ref 15–41)
BUN: 22 mg/dL — ABNORMAL HIGH (ref 6–20)
CO2: 23 mmol/L (ref 22–32)
Calcium: 10.1 mg/dL (ref 8.9–10.3)
Chloride: 97 mmol/L — ABNORMAL LOW (ref 98–111)
Creatinine, Ser: 1.07 mg/dL — ABNORMAL HIGH (ref 0.44–1.00)
GFR calc Af Amer: >60 mL/min (ref >60)
GFR calc non Af Amer: >60 mL/min (ref >60)
GLUCOSE: 447 mg/dL — AB (ref 70–99)
Potassium: 5.3 mmol/L — ABNORMAL HIGH (ref 3.5–5.1)
Sodium: 138 mmol/L (ref 135–145)
Total Bilirubin: 0.3 mg/dL (ref 0.3–1.2)
Total Protein: 7.9 g/dL (ref 6.5–8.1)

## 2018-06-25 LAB — CBC WITH DIFFERENTIAL/PLATELET
Abs Immature Granulocytes: 0.01 10*3/uL (ref 0.00–0.07)
BASOS ABS: 0 10*3/uL (ref 0.0–0.1)
Basophils Relative: 1 %
EOS PCT: 0 %
Eosinophils Absolute: 0 10*3/uL (ref 0.0–0.5)
HCT: 40 % (ref 36.0–46.0)
HEMOGLOBIN: 13.7 g/dL (ref 12.0–15.0)
IMMATURE GRANULOCYTES: 0 %
Lymphocytes Relative: 44 %
Lymphs Abs: 1.3 10*3/uL (ref 0.7–4.0)
MCH: 30.9 pg (ref 26.0–34.0)
MCHC: 34.3 g/dL (ref 30.0–36.0)
MCV: 90.3 fL (ref 80.0–100.0)
Monocytes Absolute: 0.1 10*3/uL (ref 0.1–1.0)
Monocytes Relative: 4 %
NEUTROS PCT: 51 %
NRBC: 0 % (ref 0.0–0.2)
Neutro Abs: 1.5 10*3/uL — ABNORMAL LOW (ref 1.7–7.7)
Platelets: 174 10*3/uL (ref 150–400)
RBC: 4.43 MIL/uL (ref 3.87–5.11)
RDW: 15.7 % — AB (ref 11.5–15.5)
WBC: 2.9 10*3/uL — AB (ref 4.0–10.5)

## 2018-06-25 NOTE — Telephone Encounter (Signed)
Called and given below message. She verbalized understanding. 

## 2018-06-25 NOTE — Progress Notes (Signed)
REFERRING PROVIDER: [name], MD [address] [place], [place] [address]  PRIMARY PROVIDER:  Monico Blitz, MD  PRIMARY REASON FOR VISIT:  1. Endometrial cancer ([place])   2. Family history of ovarian cancer   3. Family history of breast cancer   4. Family history of uterine cancer   5. Family history of kidney cancer   6. Family history of cervical cancer   7. Family history of throat cancer      HISTORY OF PRESENT ILLNESS:   Ms. Ninneman, a 37 y.o. female, was seen for a [facility] cancer genetics consultation at the request of Dr. Gerarda Fraction due to a personal and family history of cancer.  Ms. Reyburn presents to clinic today to discuss the possibility of a hereditary predisposition to cancer, genetic testing, and to further clarify her future cancer risks, as well as potential cancer risks for family members.   In 2019, at the age of 50, Ms. Clinch was diagnosed with endometrial cancer. Initially an endometrial biopsy was taken and she had right salpingo-oophorectomy. She has had chemotherapy thus far.   CANCER HISTORY:    Endometrial cancer ([place])   04/06/2018 Initial Diagnosis    She presented with severe abdominal pain    04/07/2018 Imaging    She presented to the emergency room after the pain woke her up CT imaging was performed 04/07/2018 [facility].  A 6.5 x 7 x 8.1 complex right adnexal mass was found with solid and fat components consistent with a teratoma.  No lymphadenopathy.  Small to moderate ascites and inflammatory changes were seen in the pelvis follow-up ultrasound was also performed showing concern for right ovarian torsion with a large ovarian mass cystic and solid components 9.7 cm.  A left ovarian cyst seen 3.4 cm.  Endometrial thickening with hypervascularity and endometrial thickness of 24 mm moderate pelvic free fluid.    04/07/2018 Surgery    Given the concern for torsion she was taken urgently to the operating room by Dr. Orson Ape. McLeod 04/07/2018.  At that time  operative laparoscopy, torsion of the right adnexa was noted along with thickened abnormal endometrium.  She had right salpingo-oophorectomy through a laparoscopic approach along with endometrial biopsy.      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 Biopsy of left parametrial tissue and sigmoid peritoneum  Surgeon: Bernita Raisin, MD  Specimens: pelvic washings, parametrial biopsy, sigmoid peritoneum biopsy  Complications: none  Indication for Procedure:  Patient found at OSH to have right ovarian mass that was resected and found on permanent section to be c/w endometrial cancer. Also had endometrial biopsy showing the same.  Operative Findings: Significant adhesive disease almost miliary-like. The sigmoid was adherent to the left cornua. After takedown of the sigmoid in this region it was clear the left cornua was involved in an abnormal process including necrotic tissue clinically concerning for malignancy. In addition the bladder was adherent to this region. Posterior surface of the broad ligament  in area c/w the parametria on the left showed likely tumor breakthrough. Images were taken. Biopsy from the tissue extruding from the left side of the uterus in the presumed parametrium was sent for frozen. This returned worrisome for disease, but not definitive. A separate area of the sigmoid was adherent inferior to the posterior  surface lesion and the remnant of this adhesion was sent as sigmoid peritoneum. This, on frozen, returned with fibrosis.     04/24/2018 Pathology Results    1. Peritoneum, biopsy - METASTATIC CARCINOMA CONSISTENT WITH PATIENT'S CLINICAL HISTORY OF PRIMARY ENDOMETRIAL CARCINOMA. SEE NOTE 2. Soft tissue, biopsy, parametrial - METASTATIC CARCINOMA CONSISTENT WITH PATIENT'S CLINICAL HISTORY OF PRIMARY ENDOMETRIAL CARCINOMA. SEE NOTE Diagnosis Note 1. and 2. Immunohistochemical stains show that the tumor cells are positive for PAX8 and negative for calretinin, consistent with the above diagnosis    04/25/2018 Cancer Staging    Staging form: Corpus Uteri - Carcinoma and Carcinosarcoma, AJCC 8th Edition - Clinical: Stage IVA (cT4, cN0, cM0) - Signed by Heath Lark, MD on 04/25/2018    05/02/2018 Tumor Marker    Patient's tumor was tested for the following markers: CA-125 Results of the tumor marker test revealed 56.7    05/04/2018 -  Chemotherapy    The patient had carboplatin and Taxol    05/08/2018 Procedure    Placement of a power injectable Port-A-Cath    05/29/2018 Tumor Marker    Patient's tumor was tested for the following markers: CA-125 Results of the tumor marker test revealed 31.6    06/19/2018 Tumor Marker    Patient's tumor was tested for the following markers: CA-125 Results of the tumor marker test revealed 21.1      HORMONAL RISK FACTORS:  Menarche was at age 22.  First live birth at age: no children.  OCP use for approximately 0 years.  Ovaries intact: no. (she still has left ovary) Hysterectomy: no.  Menopausal status: premenopausal.  HRT use: 0 years. Colonoscopy: no; not examined. Mammogram within the last year: no. Number of breast biopsies: 0.  Past Medical History:  Diagnosis Date  . Arthritis   . Asthma   . Complication of anesthesia   . 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 Family history of breast cancer   . Family  history of cervical cancer   . Family history of kidney cancer   . Family history of ovarian cancer   . Family history of throat cancer   . Family history of uterine cancer   . GERD (gastroesophageal reflux disease)   . Hypertension   . PONV (postoperative nausea and vomiting)     Past Surgical History:  Procedure Laterality Date  . IR IMAGING GUIDED PORT INSERTION  05/08/2018  . LAPAROSCOPIC SALPINGOOPHERECTOMY Right 2019   torsion  . ROBOTIC ASSISTED TOTAL HYSTERECTOMY WITH BILATERAL SALPINGO OOPHERECTOMY N/A 04/24/2018   Procedure: XI ROBOTIC  DIAGNOSTIC LAPAROSCOPY WITH BIOPSY.;  Surgeon: [name], MD;  Location: [facility];  Service: Gynecology;  Laterality: N/A;    Social History   Socioeconomic History  . Marital status: Married    Spouse name: Richard  . Number of children: Not on file  . Years of education: Not on file  . Highest education level: Not on file  Occupational History  . Occupation: Freight forwarder  Social Needs  . Financial resource strain: Not on file  . Food insecurity:    Worry: Not on file    Inability: Not on file  .  Transportation needs:    Medical: Not on file    Non-medical: Not on file  Tobacco Use  . Smoking status: Never Smoker  . Smokeless tobacco: Never Used  Substance and Sexual Activity  . Alcohol use: No  . Drug use: No  . Sexual activity: Not on file  Lifestyle  . Physical activity:    Days per week: Not on file    Minutes per session: Not on file  . Stress: Not on file  Relationships  . Social connections:    Talks on phone: Not on file    Gets together: Not on file    Attends religious service: Not on file    Active member of club or organization: Not on file    Attends meetings of clubs or organizations: Not on file    Relationship status: Not on file  Other Topics Concern  . Not on file  Social History Narrative  . Not on file     FAMILY HISTORY:  We obtained a detailed, 4-generation family history.  Significant  diagnoses are listed below: Family History  Problem Relation Age of Onset  . Diabetes Mother   . Cervical cancer Mother   . Throat cancer Mother 12  . Heart attack Father   . Diabetes Brother   . Ovarian cancer Maternal Aunt 21  . Uterine cancer Maternal Aunt 21  . Breast cancer Maternal Aunt 21  . Epilepsy Paternal Aunt   . Kidney cancer Brother 20  . Ovarian cancer Cousin 53    Ms. Arreola does not have children. She had two full brothers, ages 101 and 23 with no cancer history. She has two half-sisters through her father who live with Ms. Wolz and she takes care of them. They are 19 and 22. She also has a half-brother through her father who is 57 is fighting kidney cancer that was diagnosed when he was 60. Ms. Domek has a half-brother through her mother who is 21.   Ms. Setzer's mother is living at 76. She has a history of cervical cancer diagnosed when she was younger, and throat cancer when she was 39. Ms. Ruhlman has two maternal uncles in their 57's and two maternal aunts. One of her aunts has a history of ovarian cancer, uterine cancer, and breast cancer all diagnosed at 1. She is living and doing well now at 51. This aunt has a daughter, Ms. Silverthorn's cousin, who was recently diagnosed with ovarian cancer at 31 and is pregnant. Ms. Val knows these individuals have not had genetic testing. No other cancers in Ms. Wessner's maternal cousins.   Ms. Lusty's father passed away at 71 from a heart attack.  Ms. Crafton has two paternal aunts and one paternal uncle. The uncle passed away in an accident and one of the aunts passed away from epilepsy. No cancers in paternal aunts/uncles/cousins/grandparents. Her paternal grandfather died in his 39's of liver issues and her grandmother died at 32.  Ms. Fotheringham is unaware of previous family history of genetic testing for hereditary cancer risks. Patient's maternal ancestors are of Caucasian descent, and paternal ancestors are of Caucasian descent. There is no reported  Ashkenazi Jewish ancestry. There is no known consanguinity.  GENETIC COUNSELING ASSESSMENT: Keonda Cargile is a 37 y.o. female with a personal and family history of cancer which is somewhat suggestive of a Hereditary Cancer Predisposition Syndrome. We, therefore, discussed and recommended the following at today's visit.   DISCUSSION: We discussed that 5-10% of cancer is hereditary.  We discussed Lynch Syndrome, also called HNPCC (Hereditary Non-Polyposis Colon Cancer), given her personal history of cancer.  This syndrome increases the risk for colon, uterine, ovarian and stomach cancers, brain cancers, as well as others.  Families with Lynch Syndrome tend to have multiple family members with these cancers, typically diagnosed under age 14, and diagnoses in multiple generations. The genes that are known to cause Lynch Syndrome are called MLH1, MSH2, MSH6, PMS2 and EPCAM.  We discussed that there are several other genes that are associated with an increased risk for cancer. We also dicussed that there are many genes that cause many different types of cancer risks.    We reviewed the characteristics, features and inheritance patterns of hereditary cancer syndromes. We also discussed genetic testing, including the appropriate family members to test, the process of testing, insurance coverage and turn-around-time for results. We discussed the implications of a negative, positive and/or variant of uncertain significant result. We recommended Ms. Vachon pursue genetic testing for the [facility] Multi-Cancer gene panel.   The Multi-Cancer Panel offered by [facility] includes sequencing and/or deletion duplication testing of the following 84 genes: AIP, ALK, APC, ATM, AXIN2,BAP1,  BARD1, BLM, BMPR1A, BRCA1, BRCA2, BRIP1, CASR, CDC73, CDH1, CDK4, CDKN1B, CDKN1C, CDKN2A (p14ARF), CDKN2A (p16INK4a), CEBPA, CHEK2, CTNNA1, DICER1, DIS3L2, EGFR (c.2369C>T, p.Thr790Met variant only), EPCAM (Deletion/duplication testing only), FH,  FLCN, GATA2, GPC3, GREM1 (Promoter region deletion/duplication testing only), HOXB13 (c.251G>A, p.Gly84Glu), HRAS, KIT, MAX, MEN1, MET, MITF (c.952G>A, p.Glu318Lys variant only), MLH1, MSH2, MSH3, MSH6, MUTYH, NBN, NF1, NF2, NTHL1, PALB2, PDGFRA, PHOX2B, PMS2, POLD1, POLE, POT1, PRKAR1A, PTCH1, PTEN, RAD50, RAD51C, RAD51D, RB1, RECQL4, RET, RUNX1, SDHAF2, SDHA (sequence changes only), SDHB, SDHC, SDHD, SMAD4, SMARCA4, SMARCB1, SMARCE1, STK11, SUFU, TERC, TERT, TMEM127, TP53, TSC1, TSC2, VHL, WRN and WT1.   We discussed that if she is found to have a mutation in one of these genes, it may impact future medical management recommendations such as increased cancer screenings and consideration of risk reducing surgeries.  A positive result could also have implications for the patient's family members.  A Negative result would mean we were unable to identify a hereditary component to her cancer, but does not rule out the possibility of a hereditary basis for her endometrial cancer.  There could be mutations that are undetectable by current technology, or in genes not yet tested or identified to increase cancer risk.    We discussed the potential to find a Variant of Uncertain Significance or VUS.  These are variants that have not yet been identified as pathogenic or benign, and it is unknown if this variant is associated with increased cancer risk or if this is a normal finding.  Most VUS's are reclassified to benign or likely benign.   It should not be used to make medical management decisions. With time, we suspect the lab will determine the significance of any VUS's identified if any.   Based on Ms. Saldarriaga's personal and family history of cancer, she meets NCCN medical criteria for genetic testing. Despite that she meets criteria, she may still have an out of pocket cost. The lab will notify her of an OOP cost if any.   PLAN: After considering the risks, benefits, and limitations, Ms. Matthies  provided informed  consent to pursue genetic testing and the blood sample was sent to [facility] for analysis of the Multi-Cancer Panel. Results should be available within approximately 2-3 weeks' time, at which point they will be disclosed by telephone to Ms. Avellino, as will  any additional recommendations warranted by these results. Ms. Fuente will receive a summary of her genetic counseling visit and a copy of her results once available. This information will also be available in Epic.   Based on Ms. Dials's family history, we recommended her maternal aunt and cousin have genetic counseling and testing. Ms. Kercher will let us know if we can be of any assistance in coordinating genetic counseling and/or testing for these family members.  Lastly, we encouraged Ms. Vallance to remain in contact with cancer genetics annually so that we can continuously update the family history and inform her of any changes in cancer genetics and testing that may be of benefit for this family.   Ms.  Teale's questions were answered to her satisfaction today. Our contact information was provided should additional questions or concerns arise. Thank you for the referral and allowing [place] to share in the care of your patient.   Faith Rogue, MS Genetic Counselor [email or web address] Phone: [phone]  The patient was seen for a total of 35 minutes in face-to-face genetic counseling.  The patient was accompanied today by her husband Richard.

## 2018-06-25 NOTE — Telephone Encounter (Signed)
-----   Message from Heath Lark, MD sent at 06/25/2018 11:15 AM EDT ----- Regarding: high potassium and blood sugar I recommend hold lisinopril for 3 days and drink more fluids Also call PCP if diabetes medication need to be adjusted ----- Message ----- From: Buel Ream, Lab [facility] [place] Sent: 06/25/2018   9:31 AM EDT To: Heath Lark, MD

## 2018-06-26 LAB — CA 125: Cancer Antigen (CA) 125: 16.1 U/mL (ref 0.0–38.1)

## 2018-06-27 ENCOUNTER — Other Ambulatory Visit: Payer: BLUE CROSS/BLUE SHIELD

## 2018-06-27 ENCOUNTER — Ambulatory Visit (HOSPITAL_COMMUNITY)
Admission: RE | Admit: 2018-06-27 | Discharge: 2018-06-27 | Disposition: A | Discharge status: Home / Self Care | Payer: BLUE CROSS/BLUE SHIELD | Source: Ambulatory Visit | Attending: Hematology and Oncology | Admitting: Hematology and Oncology

## 2018-06-27 ENCOUNTER — Encounter (HOSPITAL_COMMUNITY): Payer: Self-pay

## 2018-06-27 DIAGNOSIS — K760 Fatty (change of) liver, not elsewhere classified: Secondary | ICD-10-CM | POA: Insufficient documentation

## 2018-06-27 DIAGNOSIS — C541 Malignant neoplasm of endometrium: Secondary | ICD-10-CM | POA: Diagnosis not present

## 2018-06-27 MED ORDER — SODIUM CHLORIDE 0.9 % IJ SOLN
INTRAMUSCULAR | Status: AC
  Filled 2018-06-27: qty 50

## 2018-06-27 MED ORDER — HEPARIN SOD (PORK) LOCK FLUSH 100 UNIT/ML IV SOLN
INTRAVENOUS | Status: AC
  Administered 2018-06-27: 500 [IU]
  Filled 2018-06-27: qty 5

## 2018-06-27 MED ORDER — IOHEXOL 300 MG/ML  SOLN
100.0000 mL | Freq: Once | INTRAMUSCULAR | Status: AC | PRN
  Administered 2018-06-27: 100 mL via INTRAVENOUS

## 2018-06-27 NOTE — Progress Notes (Signed)
FMLA successfully faxed to [employer] at [phone]. Mailed copy to patient address on file. 

## 2018-06-29 ENCOUNTER — Inpatient Hospital Stay (HOSPITAL_BASED_OUTPATIENT_CLINIC_OR_DEPARTMENT_OTHER): Payer: BLUE CROSS/BLUE SHIELD | Admitting: Obstetrics

## 2018-06-29 ENCOUNTER — Encounter: Payer: Self-pay | Admitting: Obstetrics

## 2018-06-29 VITALS — BP 124/89 | HR 114 | Temp 98.3°F | Resp 20 | Ht 61.5 in | Wt 176.2 lb

## 2018-06-29 DIAGNOSIS — C541 Malignant neoplasm of endometrium: Secondary | ICD-10-CM | POA: Diagnosis not present

## 2018-06-29 DIAGNOSIS — C786 Secondary malignant neoplasm of retroperitoneum and peritoneum: Secondary | ICD-10-CM

## 2018-06-29 DIAGNOSIS — Z90722 Acquired absence of ovaries, bilateral: Secondary | ICD-10-CM

## 2018-06-29 NOTE — Patient Instructions (Signed)
1. Plan for surgery 1st or 2nd week of January 2. MRI ~12/23 and then followup in office ~12/30 (these dates may change)

## 2018-06-29 NOTE — Progress Notes (Signed)
[place] at [facility]   Progress Note: Established Patient Follow-Up   Consult was originally requested by Dr. 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  In addition an endometrial biopsy showing the same.  Chief Complaint  Patient presents with  . Endometrial ca ([place])  1. 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 Possible parametrial involvement  05/04/18 - present Carbo/Taxol  HPI: [name]  is a very nice 37 y.o.  P0   Interval History  She has treatment related alopecia. No complaints. Denies bleeding, denies pain.  She is here to review her post-Cycle 3 CTscan and discuss further management recommendations.   Oncologic Course She was in her usual state of health until the evening of 04/06/18 at which time she woke up with significant right lower quadrant pain.  She presented to the emergency room after the pain woke her up CT imaging was performed 04/07/2018 [facility] rocking him.  A 6.5 x 7 x 8.1 complex right adnexal mass was found with solid and fat components consistent with a teratoma.  No lymphadenopathy.  Small to moderate ascites and inflammatory changes were seen in the pelvis follow-up ultrasound was also performed showing concern for right ovarian torsion with a large ovarian mass cystic and solid components 9.7 cm.  A left ovarian cyst seen 3.4 cm.  Endometrial thickening with hypervascularity and endometrial thickness of 24 mm moderate pelvic free fluid.  Given the concern for torsion she was taken urgently to the operating room by Dr. Orson Ape. McLeod 04/07/2018.  At that time operative laparoscopy with right salpingooophorectomy and findings consistent with torsion was performed.  In addition endometrial biopsy was performed.  Unfortunately  final pathology reveals in the right ovary and endometrioid type adenocarcinoma the endometrial biopsy showed endometrioid type adenocarcinoma.  Comments include a favoring of endometrium as the primary.  The patient is therefore referred for recommendations and probable management.  She denied nausea , vomiting, denied recent bloating or pain denied any changes in her bowel habits or urinary habits.  She denies excess weight loss and she states she has a normal appetite.  She does admit to a long history of abnormal menses.  She states that she started her menstrual cycle at age 37.  There is documentation on the chart of possible PCOS.  Periods are described as both irregular and heavy with episodes intermittently of amenorrhea.  I took her to surgery 04/24/18 planning to do a completion surgery however she had left cornual/serosal disease fusing to the sigmoid and possibly emanating from her left parametrium. Intraoperative findings as follows: Significant adhesive disease almost miliary-like. The sigmoid was adherent to the left cornua. After takedown of the sigmoid in this region it was clear the left cornua was involved in an abnormal process including necrotic tissue clinically concerning for malignancy. In addition the bladder was adherent to this region. Posterior surface of the broad ligament in area c/w the parametria on the left showed likely tumor breakthrough. Images were taken. Biopsy from the tissue extruding from the left side of the uterus in the presumed parametrium was sent for frozen. This returned worrisome for disease, but not definitive. A separate area of the sigmoid was adherent inferior to the posterior surface lesion and the remnant of this adhesion was sent as sigmoid peritoneum. Tissue biopsies as follows:  Peritoneum, biopsy (Sigmoid peritoneum) - METASTATIC CARCINOMA CONSISTENT WITH PATIENT'S CLINICAL HISTORY OF PRIMARY ENDOMETRIAL CARCINOMA. SEE NOTE 2. Soft tissue, biopsy,  parametrial - METASTATIC CARCINOMA CONSISTENT WITH PATIENT'S CLINICAL HISTORY OF PRIMARY ENDOMETRIAL CARCINOMA. SEE NOTE  She started chemotherapy with Carboplatin/Taxol 05/04/18   Imported EPIC Oncologic History:    Endometrial cancer ([place])   04/06/2018 Initial Diagnosis    She presented with severe abdominal pain    04/07/2018 Imaging    She presented to the emergency room after the pain woke her up CT imaging was performed 04/07/2018 [facility].  A 6.5 x 7 x 8.1 complex right adnexal mass was found with solid and fat components consistent with a teratoma.  No lymphadenopathy.  Small to moderate ascites and inflammatory changes were seen in the pelvis follow-up ultrasound was also performed showing concern for right ovarian torsion with a large ovarian mass cystic and solid components 9.7 cm.  A left ovarian cyst seen 3.4 cm.  Endometrial thickening with hypervascularity and endometrial thickness of 24 mm moderate pelvic free fluid.    04/07/2018 Surgery    Given the concern for torsion she was taken urgently to the operating room by Dr. Orson Ape. McLeod 04/07/2018.  At that time operative laparoscopy, torsion of the right adnexa was noted along with thickened abnormal endometrium.  She had right salpingo-oophorectomy through a laparoscopic approach along with endometrial biopsy.      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 Biopsy of  left parametrial tissue and sigmoid peritoneum  Surgeon: Bernita Raisin, MD  Specimens: pelvic washings, parametrial biopsy, sigmoid peritoneum biopsy  Complications: none  Indication for Procedure:  Patient found at OSH to have right ovarian mass that was resected and found on permanent section to be c/w endometrial cancer. Also had endometrial biopsy showing the same.  Operative Findings: Significant adhesive disease almost miliary-like. The sigmoid was adherent to the left cornua. After takedown of the sigmoid in this region it was clear the left cornua was involved in an abnormal process including necrotic tissue clinically concerning for malignancy. In addition the bladder was adherent to this region. Posterior surface of the broad ligament in area c/w the parametria on the left showed likely tumor breakthrough. Images were taken. Biopsy from the tissue extruding from the left side of the uterus in the presumed parametrium was sent for frozen. This returned worrisome for disease, but not definitive. A separate area of the sigmoid was adherent inferior to the posterior surface lesion and the remnant of this adhesion was sent as sigmoid peritoneum. This, on frozen, returned with fibrosis.     04/24/2018 Pathology Results    1. Peritoneum, biopsy - METASTATIC CARCINOMA CONSISTENT WITH PATIENT'S CLINICAL HISTORY OF PRIMARY ENDOMETRIAL CARCINOMA. SEE NOTE 2. Soft tissue, biopsy, parametrial - METASTATIC CARCINOMA CONSISTENT WITH PATIENT'S CLINICAL HISTORY OF PRIMARY ENDOMETRIAL CARCINOMA. SEE NOTE Diagnosis Note 1. and 2. Immunohistochemical stains show that the tumor cells are positive for PAX8 and negative for calretinin, consistent  with the above diagnosis    04/25/2018 Cancer Staging    Staging form: Corpus Uteri - Carcinoma and Carcinosarcoma, AJCC 8th Edition - Clinical: Stage IVA (cT4, cN0, cM0) - Signed by Heath Lark, MD on 04/25/2018    05/02/2018 Tumor Marker    Patient's  tumor was tested for the following markers: CA-125 Results of the tumor marker test revealed 56.7    05/04/2018 -  Chemotherapy    The patient had carboplatin and Taxol    05/08/2018 Procedure    Placement of a power injectable Port-A-Cath    05/29/2018 Tumor Marker    Patient's tumor was tested for the following markers: CA-125 Results of the tumor marker test revealed 31.6    06/19/2018 Tumor Marker    Patient's tumor was tested for the following markers: CA-125 Results of the tumor marker test revealed 21.1    06/28/2018 Imaging    No evidence of malignancy or other acute findings within the abdomen or pelvis.  Moderate hepatic steatosis.     Measurement of disease: CA125 potentially and clinical exam, likely will plan imaging for some portion of surveillance . Preop (however after RSO) 04/23/18 = 73.2 . 05/02/18 = 56.7 (prechemo)  Radiology: Ct Abdomen Pelvis W Contrast  Result Date: 06/28/2018 CLINICAL DATA:  Followup endometrial carcinoma. Nausea. Currently undergoing chemotherapy. EXAM: CT ABDOMEN AND PELVIS WITH CONTRAST TECHNIQUE: Multidetector CT imaging of the abdomen and pelvis was performed using the standard protocol following bolus administration of intravenous contrast. CONTRAST:  142mL OMNIPAQUE IOHEXOL 300 MG/ML  SOLN COMPARISON:  04/07/2018 FINDINGS: Lower Chest: No acute findings. Hepatobiliary: No hepatic masses identified. Moderate hepatic steatosis. Gallbladder is unremarkable. Pancreas:  No mass or inflammatory changes. Spleen: Within normal limits in size and appearance. Adrenals/Urinary Tract: No masses identified. No evidence of hydronephrosis. Stomach/Bowel: No evidence of obstruction, inflammatory process or abnormal fluid collections. Vascular/Lymphatic: No pathologically enlarged lymph nodes. No abdominal aortic aneurysm. Reproductive: Right ovarian dermoid is been resected since previous study. Uterus and adnexal regions are unremarkable in appearance.  Other:  None. Musculoskeletal:  No suspicious bone lesions identified. IMPRESSION: No evidence of malignancy or other acute findings within the abdomen or pelvis. Moderate hepatic steatosis. Electronically Signed   By: Earle Gell M.D.   On: 06/28/2018 07:49   . 04/07/18 - CT AP and TVUS as noted in HPI  Outpatient Encounter Medications as of 06/29/2018  Medication Sig  . amLODipine (NORVASC) 5 MG tablet Take 5 mg by mouth at bedtime.   Marland Kitchen dexamethasone (DECADRON) 4 MG tablet Take 3 tabs at the night before and 3 tab the morning of chemotherapy, every 3 weeks, by mouth  . empagliflozin (JARDIANCE) 10 MG TABS tablet Take 10 mg by mouth daily.   . insulin degludec (TRESIBA FLEXTOUCH) 100 UNIT/ML SOPN FlexTouch Pen Inject 33 Units into the skin daily.   Marland Kitchen lidocaine-prilocaine (EMLA) cream Apply 1 application topically as needed.  Marland Kitchen lisinopril (PRINIVIL,ZESTRIL) 20 MG tablet Take 20 mg by mouth at bedtime.   . metFORMIN (GLUCOPHAGE) 500 MG tablet Take 1,000 mg by mouth 2 (two) times daily with a meal. 1 at lunch time and 1 at bedtime  . ondansetron (ZOFRAN) 8 MG tablet Take 1 tablet (8 mg total) by mouth every 8 (eight) hours as needed for nausea.  . prochlorperazine (COMPAZINE) 10 MG tablet TAKE 1 TABLET BY MOUTH EVERY 6 HOURS AS NEEDED FOR NAUSEA FOR VOMITING   No facility-administered encounter medications on file as of 06/29/2018.    Allergies  Allergen Reactions  . Latex Rash    Large Rash, Hives - Per DR. Gerarda Fraction. Question if this is a latex allergy versus Dermabond   . Other Rash    Dermabond    Past Medical History:  Diagnosis Date  . Arthritis   . Asthma   . 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 GERD (gastroesophageal reflux disease)   . Hypertension   . PONV (postoperative nausea and vomiting)    Past Surgical History:  Procedure Laterality Date  . IR IMAGING GUIDED PORT INSERTION  05/08/2018  . LAPAROSCOPIC SALPINGOOPHERECTOMY Right 2019   torsion   . ROBOTIC ASSISTED TOTAL HYSTERECTOMY WITH BILATERAL SALPINGO OOPHERECTOMY N/A 04/24/2018   Procedure: XI ROBOTIC  DIAGNOSTIC LAPAROSCOPY WITH BIOPSY.;  Surgeon: [name], MD;  Location: [facility];  Service: Gynecology;  Laterality: N/A;        Past Gynecological History:   GYNECOLOGIC HISTORY:  . No LMP recorded. (Menstrual status: Chemotherapy).  . Menarche: 37 years old . P 0 . Contraceptive none . HRT N/A  . Last Pap sure Family Hx:  Family History  Problem Relation Age of Onset  . Diabetes Mother   . Cervical cancer Mother   . Throat cancer Mother 62  . Heart attack Father   . Diabetes Brother   . Ovarian cancer Maternal Aunt 21  . Uterine cancer Maternal Aunt 21  . Breast cancer Maternal Aunt 21  . Epilepsy Paternal Aunt   . Kidney cancer Brother 28  . Ovarian cancer Cousin 108   Social Hx:  Marland Kitchen Tobacco use: None . Alcohol use: None . Illicit Drug use: None . Illicit IV Drug use: Never    Review of Systems: Review of Systems  Genitourinary: Positive for vaginal bleeding.   All other systems reviewed and are negative.  Vitals: Blood pressure 121/87, pulse 98, temperature 98.7 F (37.1 C), temperature source Oral, resp. rate 20, height 5' 1.5" (1.562 m), weight 166 lb 12.8 oz (75.7 kg), SpO2 100 %. Vitals:   06/29/18 1300  Weight: 176 lb 4 oz (79.9 kg)  Height: 5' 1.5" (1.562 m)    Vitals:   06/29/18 1300  BP: 124/89  Pulse: (!) 114  Resp: 20  Temp: 98.3 F (36.8 C)  SpO2: 100%   Body mass index is 32.76 kg/m.   Physical Exam:  ECOG PERFORMANCE STATUS: 0 - Asymptomatic   General :  Obese. Well developed, 37 y.o., female in no apparent distress HEENT:  Normocephalic/atraumatic, symmetric, EOMI, eyelids normal Neck:   Supple, no masses.  Lymphatics:  No cervical/ submandibular/ supraclavicular/ infraclavicular/ inguinal adenopathy Respiratory:  Respirations unlabored, no use of accessory muscles CV:   Deferred Breast:   Deferred Musculoskeletal: No CVA tenderness, normal muscle strength. Abdomen:  Trocar sites are soft. Abdominal wall is soft, non-tender and nondistended. No evidence of hernia. No masses. Extremities:  No lymphedema, no erythema, non-tender. Skin:   Normal inspection Neuro/Psych:  No focal motor deficit, no abnormal mental status. Normal gait. Normal affect. Alert and oriented to person, place, and time  Genito Urinary: Vulva: Normal external female genitalia.  Bladder/urethra: Urethral meatus normal in size and location. No lesions or   masses, well supported bladder Speculum exam: Vagina: No lesion, no discharge, no bleeding. Cervix: Normal appearing, no lesions. Bimanual exam:  Uterus: mobile Due to habitus unable to delineate size  Adnexal region: No masses. Rectovaginal:  Good tone, no masses, no cul de sac nodularity, no parametrial involvement  or nodularity. However parametrial assessment limited due to habitus      Assessment  Locally advanced (pelvic peritoneum) Endometrial cancer  Plan  1. We previously discussed referral to genetics today given her young age. She has seen them and test results are pending 2. Management discussion ? Continue neoadjuvant chemotherapy. ? She is locally advanced based on the information we have, so surgery is not unreasonable at some point during her treatment course. ? I favor rather than interrupting her 6 cycles we just plan surgery at the end of treatment ? We may want to consider brachytherapy postop given the possible parametrial involvement  3. Given the question of parametrial disease I have ordered a MRI for surgical discussion 4. RTC after MRI (~end of Dec) to review and plan surgery 5. Potential for surgery early January.  15  minutes of direct face to face counseling time was spent with the patient. This included discussion about prognosis, therapy recommendations including chemotherapy and treatment planningthat are beyond the  scope of routine postoperative care.  [name], MD  06/29/2018, 2:33 PM    Cc: Albertina Parr, MD (Referring Ob/Gyn) Monico Blitz, MD (PCP)

## 2018-07-02 ENCOUNTER — Other Ambulatory Visit: Payer: Self-pay | Admitting: Hematology and Oncology

## 2018-07-02 ENCOUNTER — Telehealth: Payer: Self-pay | Admitting: Hematology and Oncology

## 2018-07-02 NOTE — Telephone Encounter (Signed)
Scheduled appt per 10/28 sch message - pt to get an updated schedule next visit.

## 2018-07-05 ENCOUNTER — Telehealth: Payer: Self-pay | Admitting: Licensed Clinical Social Worker

## 2018-07-05 ENCOUNTER — Encounter: Payer: Self-pay | Admitting: Licensed Clinical Social Worker

## 2018-07-05 ENCOUNTER — Telehealth: Payer: Self-pay | Admitting: Hematology and Oncology

## 2018-07-05 ENCOUNTER — Ambulatory Visit: Payer: Self-pay | Admitting: Licensed Clinical Social Worker

## 2018-07-05 DIAGNOSIS — Z1379 Encounter for other screening for genetic and chromosomal anomalies: Secondary | ICD-10-CM

## 2018-07-05 NOTE — Telephone Encounter (Signed)
Revealed negative genetic testing. We discussed that we do not know why she has cancer or why there is cancer in the family. It could be due to a different gene that we are not testing, or something our current technology cannot pick up.  It will be important for her to keep in contact with genetics to learn if additional testing may be needed in the future. We also discussed that her maternal relatives, especially those with ovarian cancer, could benefit from genetic counseling and testing.

## 2018-07-05 NOTE — Telephone Encounter (Signed)
NG out 10/30 thru 11/29 - moved 11/5 f/u to [facility] - 11/26 f/u already moved to [facility]. Spoke with patient confirming changes.

## 2018-07-05 NOTE — Progress Notes (Signed)
HPI:  Ms. Henrie was previously seen in the [place] clinic on 06/25/2018 due to a personal and family history of cancer and concerns regarding a hereditary predisposition to cancer. Please refer to our prior cancer genetics clinic note for more information regarding Ms. Snedden's medical, social and family histories, and our assessment and recommendations, at the time. Ms. Trombetta's recent genetic test results were disclosed to her, as well as recommendations warranted by these results. These results and recommendations are discussed in more detail below.  CANCER HISTORY:    Endometrial cancer ([place])   04/06/2018 Initial Diagnosis    She presented with severe abdominal pain    04/07/2018 Imaging    She presented to the emergency room after the pain woke her up CT imaging was performed 04/07/2018 [facility].  A 6.5 x 7 x 8.1 complex right adnexal mass was found with solid and fat components consistent with a teratoma.  No lymphadenopathy.  Small to moderate ascites and inflammatory changes were seen in the pelvis follow-up ultrasound was also performed showing concern for right ovarian torsion with a large ovarian mass cystic and solid components 9.7 cm.  A left ovarian cyst seen 3.4 cm.  Endometrial thickening with hypervascularity and endometrial thickness of 24 mm moderate pelvic free fluid.    04/07/2018 Surgery    Given the concern for torsion she was taken urgently to the operating room by Dr. Orson Ape. McLeod 04/07/2018.  At that time operative laparoscopy, torsion of the right adnexa was noted along with thickened abnormal endometrium.  She had right salpingo-oophorectomy through a laparoscopic approach along with endometrial biopsy.      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 Biopsy of left parametrial tissue and sigmoid peritoneum  Surgeon: Bernita Raisin, MD  Specimens: pelvic washings, parametrial biopsy, sigmoid peritoneum biopsy  Complications: none  Indication for Procedure:  Patient found at OSH to have right ovarian mass that was resected and found on permanent section to be c/w endometrial cancer. Also had endometrial biopsy showing the same.  Operative Findings: Significant adhesive disease almost miliary-like. The sigmoid was adherent to the left cornua. After takedown of the sigmoid in this region it was clear the left cornua was involved in an abnormal process including necrotic tissue clinically concerning for malignancy. In addition the bladder was adherent to this region. Posterior surface of the broad ligament in area c/w the parametria on the left showed likely tumor breakthrough. Images were taken. Biopsy from the tissue extruding from the left side of the uterus in the presumed parametrium was sent for frozen. This returned worrisome for disease, but not definitive. A separate area of the sigmoid was adherent inferior to the posterior surface lesion and the remnant of this adhesion was sent as sigmoid peritoneum. This, on frozen, returned with fibrosis.     04/24/2018 Pathology Results    1. Peritoneum, biopsy - METASTATIC CARCINOMA CONSISTENT WITH PATIENT'S CLINICAL HISTORY OF PRIMARY ENDOMETRIAL CARCINOMA. SEE  NOTE 2. Soft tissue, biopsy, parametrial - METASTATIC CARCINOMA CONSISTENT WITH PATIENT'S CLINICAL HISTORY OF PRIMARY ENDOMETRIAL CARCINOMA. SEE NOTE Diagnosis Note 1. and 2.  Immunohistochemical stains show that the tumor cells are positive for PAX8 and negative for calretinin, consistent with the above diagnosis    04/25/2018 Cancer Staging    Staging form: Corpus Uteri - Carcinoma and Carcinosarcoma, AJCC 8th Edition - Clinical: Stage IVA (cT4, cN0, cM0) - Signed by Heath Lark, MD on 04/25/2018    05/02/2018 Tumor Marker    Patient's tumor was tested for the following markers: CA-125 Results of the tumor marker test revealed 56.7    05/04/2018 -  Chemotherapy    The patient had carboplatin and Taxol    05/08/2018 Procedure    Placement of a power injectable Port-A-Cath    05/29/2018 Tumor Marker    Patient's tumor was tested for the following markers: CA-125 Results of the tumor marker test revealed 31.6    06/19/2018 Tumor Marker    Patient's tumor was tested for the following markers: CA-125 Results of the tumor marker test revealed 21.1    06/28/2018 Imaging    No evidence of malignancy or other acute findings within the abdomen or pelvis.  Moderate hepatic steatosis.      FAMILY HISTORY:  We obtained a detailed, 4-generation family history.  Significant diagnoses are listed below: Family History  Problem Relation Age of Onset  . Diabetes Mother   . Cervical cancer Mother   . Throat cancer Mother 9  . Heart attack Father   . Diabetes Brother   . Ovarian cancer Maternal Aunt 21  . Uterine cancer Maternal Aunt 21  . Breast cancer Maternal Aunt 21  . Epilepsy Paternal Aunt   . Kidney cancer Brother 74  . Ovarian cancer Cousin 59   Ms. Rorke does not have children. She had two full brothers, ages 20 and 45 with no cancer history. She has two half-sisters through her father who live with Ms. Orton and she takes care of them. They are 19 and 22. She also has a half-brother through her father who is 85 is fighting kidney cancer that was diagnosed when he was 58. Ms. Fritsch has a half-brother through her mother who is 30.   Ms. Hopple's mother is  living at 60. She has a history of cervical cancer diagnosed when she was younger, and throat cancer when she was 25. Ms. Hegeman has two maternal uncles in their 17's and two maternal aunts. One of her aunts has a history of ovarian cancer, uterine cancer, and breast cancer all diagnosed at 13. She is living and doing well now at 69. This aunt has a daughter, Ms. Mclinden's cousin, who was recently diagnosed with ovarian cancer at 71 and is pregnant. Ms. Cloer knows these individuals have not had genetic testing. No other cancers in Ms. Corella's maternal cousins.   Ms. Stepter's father passed away at 32 from a heart attack.  Ms. Mantell has two paternal aunts and one paternal uncle. The uncle passed away in an accident and one of the aunts passed away from epilepsy. No cancers in paternal aunts/uncles/cousins/grandparents. Her paternal grandfather died in his 52's of liver issues and her grandmother died at 28.  Ms. Norton is unaware of previous family history of genetic testing for hereditary cancer risks. Patient's maternal ancestors are of Caucasian descent, and paternal ancestors are of Caucasian descent. There is no reported Ashkenazi Jewish ancestry. There is no known consanguinity.  GENETIC  TEST RESULTS: Genetic testing performed through [facility]'s Multi-Cancer Panel reported out on 07/04/2018 showed no pathogenic mutations. The Multi-Cancer Panel offered by [facility] includes sequencing and/or deletion duplication testing of the following 84 genes: AIP, ALK, APC, ATM, AXIN2,BAP1,  BARD1, BLM, BMPR1A, BRCA1, BRCA2, BRIP1, CASR, CDC73, CDH1, CDK4, CDKN1B, CDKN1C, CDKN2A (p14ARF), CDKN2A (p16INK4a), CEBPA, CHEK2, CTNNA1, DICER1, DIS3L2, EGFR (c.2369C>T, p.Thr790Met variant only), EPCAM (Deletion/duplication testing only), FH, FLCN, GATA2, GPC3, GREM1 (Promoter region deletion/duplication testing only), HOXB13 (c.251G>A, p.Gly84Glu), HRAS, KIT, MAX, MEN1, MET, MITF (c.952G>A, p.Glu318Lys variant only), MLH1, MSH2, MSH3,  MSH6, MUTYH, NBN, NF1, NF2, NTHL1, PALB2, PDGFRA, PHOX2B, PMS2, POLD1, POLE, POT1, PRKAR1A, PTCH1, PTEN, RAD50, RAD51C, RAD51D, RB1, RECQL4, RET, RUNX1, SDHAF2, SDHA (sequence changes only), SDHB, SDHC, SDHD, SMAD4, SMARCA4, SMARCB1, SMARCE1, STK11, SUFU, TERC, TERT, TMEM127, TP53, TSC1, TSC2, VHL, WRN and WT1.  The test report will be scanned into EPIC and will be located under the Molecular Pathology section of the Results Review tab. A portion of the result report is included below for reference.    We discussed with Ms. Dayley that because current genetic testing is not perfect, it is possible there may be a gene mutation in one of these genes that current testing cannot detect, but that chance is small.  We also discussed, that there could be another gene that has not yet been discovered, or that we have not yet tested, that is responsible for the cancer diagnoses in the family. It is also possible there is a hereditary cause for the cancer in the family that Ms. Arrellano did not inherit and therefore was not identified in her testing.  Therefore, it is important to remain in touch with cancer genetics in the future so that we can continue to offer Ms. Pernice the most up to date genetic testing.   ADDITIONAL GENETIC TESTING: We discussed with Ms. Deal that her genetic testing was fairly extensive.  If there are are genes identified to increase cancer risk that can be analyzed in the future, we would be happy to discuss and coordinate this testing at that time.    CANCER SCREENING RECOMMENDATIONS: Ms. Shilling's test result is considered negative (normal).  This means that we have not identified a hereditary cause for her personal and family history of cancer at this time.   While reassuring, this does not definitively rule out a hereditary predisposition to cancer. It is still possible that there could be genetic mutations that are undetectable by current technology, or genetic mutations in genes that have not  been tested or identified to increase cancer risk.  Therefore, it is recommended she continue to follow the cancer management and screening guidelines provided by her oncology and primary healthcare provider. An individual's cancer risk is not determined by genetic test results alone.  Overall cancer risk assessment includes additional factors such as personal medical history, family history, etc.  These should be used to make a personalized plan for cancer prevention and surveillance.    Given Ms. Whittier's personal and family histories, we must consider these negative results carefully.  Families with features suggestive of hereditary risk for cancer tend to have multiple family members with cancer, diagnoses in multiple generations, and diagnoses before the age of 80. Ms. Nudd's family exhibits some of these features. Therefore this negative result may actually be due to the limitations of current technology to detect all mutations within these genes, or there may be a different gene that has not yet been discovered or tested.  RECOMMENDATIONS FOR FAMILY MEMBERS:  Relatives in this family might be at some increased risk of developing cancer, over the general population risk, simply due to the family history of cancer.  We recommended women in this family have a yearly mammogram beginning at age 66, or 60 years younger than the earliest onset of cancer, an annual clinical breast exam, and perform monthly breast self-exams. Women in this family should also have a gynecological exam as recommended by their primary provider. All family members should have a colonoscopy by age 57 (or as directed by their doctors).  All family members should inform their physicians about the family history of cancer so their doctors can make the most appropriate screening recommendations for them.   It is also possible there is a hereditary cause for the cancer in Ms. Popovich's family that she did not inherit and therefore was not  identified in her.  We recommended her maternal relatives, particularly those with ovarian cancer, have genetic counseling and testing. Ms. Dominik will let us know if we can be of any assistance in coordinating genetic counseling and/or testing for these family members.   FOLLOW-UP: Lastly, we discussed with Ms. Tupper that cancer genetics is a rapidly advancing field and it is possible that new genetic tests will be appropriate for her and/or her family members in the future. We encouraged her to remain in contact with cancer genetics on an annual basis so we can update her personal and family histories and let her know of advances in cancer genetics that may benefit this family.   Our contact number was provided. Ms. Galvan's questions were answered to her satisfaction, and she knows she is welcome to call us at anytime with additional questions or concerns.  Faith Rogue, MS Genetic Counselor [email or web address] Phone: [phone]

## 2018-07-10 ENCOUNTER — Telehealth: Payer: Self-pay | Admitting: Hematology and Oncology

## 2018-07-10 ENCOUNTER — Inpatient Hospital Stay: Payer: BLUE CROSS/BLUE SHIELD | Attending: Obstetrics

## 2018-07-10 ENCOUNTER — Inpatient Hospital Stay (HOSPITAL_BASED_OUTPATIENT_CLINIC_OR_DEPARTMENT_OTHER): Payer: BLUE CROSS/BLUE SHIELD | Admitting: Hematology and Oncology

## 2018-07-10 ENCOUNTER — Inpatient Hospital Stay: Payer: BLUE CROSS/BLUE SHIELD

## 2018-07-10 VITALS — HR 120

## 2018-07-10 DIAGNOSIS — Z5111 Encounter for antineoplastic chemotherapy: Secondary | ICD-10-CM | POA: Insufficient documentation

## 2018-07-10 DIAGNOSIS — N289 Disorder of kidney and ureter, unspecified: Secondary | ICD-10-CM

## 2018-07-10 DIAGNOSIS — C541 Malignant neoplasm of endometrium: Secondary | ICD-10-CM | POA: Insufficient documentation

## 2018-07-10 DIAGNOSIS — M791 Myalgia, unspecified site: Secondary | ICD-10-CM

## 2018-07-10 DIAGNOSIS — C7961 Secondary malignant neoplasm of right ovary: Secondary | ICD-10-CM | POA: Insufficient documentation

## 2018-07-10 DIAGNOSIS — E1165 Type 2 diabetes mellitus with hyperglycemia: Secondary | ICD-10-CM

## 2018-07-10 DIAGNOSIS — R000 Tachycardia, unspecified: Secondary | ICD-10-CM

## 2018-07-10 LAB — COMPREHENSIVE METABOLIC PANEL
ALBUMIN: 4 g/dL (ref 3.5–5.0)
ALT: 34 U/L (ref 0–44)
AST: 18 U/L (ref 15–41)
Alkaline Phosphatase: 62 U/L (ref 38–126)
Anion gap: 16 — ABNORMAL HIGH (ref 5–15)
BUN: 27 mg/dL — ABNORMAL HIGH (ref 6–20)
CALCIUM: 10.8 mg/dL — AB (ref 8.9–10.3)
CHLORIDE: 100 mmol/L (ref 98–111)
CO2: 22 mmol/L (ref 22–32)
Creatinine, Ser: 1.03 mg/dL — ABNORMAL HIGH (ref 0.44–1.00)
GFR calc Af Amer: >60 mL/min (ref >60)
GFR calc non Af Amer: >60 mL/min (ref >60)
Glucose, Bld: 285 mg/dL — ABNORMAL HIGH (ref 70–99)
POTASSIUM: 4.7 mmol/L (ref 3.5–5.1)
Sodium: 138 mmol/L (ref 135–145)
Total Bilirubin: 0.3 mg/dL (ref 0.3–1.2)
Total Protein: 7.5 g/dL (ref 6.5–8.1)

## 2018-07-10 LAB — CBC WITH DIFFERENTIAL/PLATELET
Abs Immature Granulocytes: 0.12 10*3/uL — ABNORMAL HIGH (ref 0.00–0.07)
BASOS ABS: 0 10*3/uL (ref 0.0–0.1)
BASOS PCT: 0 %
EOS ABS: 0 10*3/uL (ref 0.0–0.5)
Eosinophils Relative: 0 %
HEMATOCRIT: 38.5 % (ref 36.0–46.0)
Hemoglobin: 12.8 g/dL (ref 12.0–15.0)
IMMATURE GRANULOCYTES: 1 %
LYMPHS ABS: 0.9 10*3/uL (ref 0.7–4.0)
Lymphocytes Relative: 10 %
MCH: 30.5 pg (ref 26.0–34.0)
MCHC: 33.2 g/dL (ref 30.0–36.0)
MCV: 91.9 fL (ref 80.0–100.0)
Monocytes Absolute: 0.3 10*3/uL (ref 0.1–1.0)
Monocytes Relative: 3 %
NEUTROS PCT: 86 %
Neutro Abs: 8.4 10*3/uL — ABNORMAL HIGH (ref 1.7–7.7)
PLATELETS: 193 10*3/uL (ref 150–400)
RBC: 4.19 MIL/uL (ref 3.87–5.11)
RDW: 17.2 % — AB (ref 11.5–15.5)
WBC: 9.7 10*3/uL (ref 4.0–10.5)
nRBC: 0.2 % (ref 0.0–0.2)

## 2018-07-10 MED ORDER — DIPHENHYDRAMINE HCL 50 MG/ML IJ SOLN
INTRAMUSCULAR | Status: AC
  Filled 2018-07-10: qty 1

## 2018-07-10 MED ORDER — SODIUM CHLORIDE 0.9 % IV SOLN
Freq: Once | INTRAVENOUS | Status: AC
  Administered 2018-07-10: 11:00:00 via INTRAVENOUS
  Filled 2018-07-10: qty 5

## 2018-07-10 MED ORDER — SODIUM CHLORIDE 0.9% FLUSH
10.0000 mL | Freq: Once | INTRAVENOUS | Status: AC
  Administered 2018-07-10: 10 mL
  Filled 2018-07-10: qty 10

## 2018-07-10 MED ORDER — DIPHENHYDRAMINE HCL 50 MG/ML IJ SOLN
50.0000 mg | Freq: Once | INTRAMUSCULAR | Status: AC
  Administered 2018-07-10: 50 mg via INTRAVENOUS

## 2018-07-10 MED ORDER — SODIUM CHLORIDE 0.9 % IV SOLN
Freq: Once | INTRAVENOUS | Status: AC
  Administered 2018-07-10: 10:00:00 via INTRAVENOUS
  Filled 2018-07-10: qty 250

## 2018-07-10 MED ORDER — FAMOTIDINE IN NACL 20-0.9 MG/50ML-% IV SOLN: 20.0000 mg | Freq: Once | INTRAVENOUS | Status: DC

## 2018-07-10 MED ORDER — SODIUM CHLORIDE 0.9 % IV SOLN
20.0000 mg | Freq: Once | INTRAVENOUS | Status: AC
  Administered 2018-07-10: 20 mg via INTRAVENOUS
  Filled 2018-07-10: qty 2

## 2018-07-10 MED ORDER — SODIUM CHLORIDE 0.9 % IV SOLN
140.0000 mg/m2 | Freq: Once | INTRAVENOUS | Status: AC
  Administered 2018-07-10: 252 mg via INTRAVENOUS
  Filled 2018-07-10: qty 42

## 2018-07-10 MED ORDER — HEPARIN SOD (PORK) LOCK FLUSH 100 UNIT/ML IV SOLN
500.0000 [IU] | Freq: Once | INTRAVENOUS | Status: AC | PRN
  Administered 2018-07-10: 500 [IU]
  Filled 2018-07-10: qty 5

## 2018-07-10 MED ORDER — PALONOSETRON HCL INJECTION 0.25 MG/5ML
INTRAVENOUS | Status: AC
  Filled 2018-07-10: qty 5

## 2018-07-10 MED ORDER — SODIUM CHLORIDE 0.9% FLUSH
10.0000 mL | INTRAVENOUS | Status: DC | PRN
  Administered 2018-07-10: 10 mL
  Filled 2018-07-10: qty 10

## 2018-07-10 MED ORDER — PALONOSETRON HCL INJECTION 0.25 MG/5ML
0.2500 mg | Freq: Once | INTRAVENOUS | Status: AC
  Administered 2018-07-10: 0.25 mg via INTRAVENOUS

## 2018-07-10 MED ORDER — SODIUM CHLORIDE 0.9 % IV SOLN
685.8000 mg | Freq: Once | INTRAVENOUS | Status: AC
  Administered 2018-07-10: 690 mg via INTRAVENOUS
  Filled 2018-07-10: qty 69

## 2018-07-10 NOTE — Telephone Encounter (Signed)
No 11/5 los orders.

## 2018-07-10 NOTE — Progress Notes (Signed)
Per Dr. Lindi Adie, ok to treat with HR 120.

## 2018-07-10 NOTE — Patient Instructions (Signed)
[facility] Discharge Instructions for Patients Receiving Chemotherapy  Today you received the following chemotherapy agents Taxol and Carboplatin.  To help prevent nausea and vomiting after your treatment, we encourage you to take your nausea medication as directed. NO ZOFRAN FOR 3 DAYS AFTER CHEMO.   If you develop nausea and vomiting that is not controlled by your nausea medication, call the clinic.   BELOW ARE SYMPTOMS THAT SHOULD BE REPORTED IMMEDIATELY:  *FEVER GREATER THAN 100.5 F  *CHILLS WITH OR WITHOUT FEVER  NAUSEA AND VOMITING THAT IS NOT CONTROLLED WITH YOUR NAUSEA MEDICATION  *UNUSUAL SHORTNESS OF BREATH  *UNUSUAL BRUISING OR BLEEDING  TENDERNESS IN MOUTH AND THROAT WITH OR WITHOUT PRESENCE OF ULCERS  *URINARY PROBLEMS  *BOWEL PROBLEMS  UNUSUAL RASH Items with * indicate a potential emergency and should be followed up as soon as possible.  Feel free to call the clinic you have any questions or concerns. The clinic phone number is [phone].  Please show the CHEMO ALERT CARD at check-in to the Emergency Department and triage nurse.    

## 2018-07-10 NOTE — Assessment & Plan Note (Addendum)
Metastatic endometrial cancer involving peritoneal deposits. Current treatment: Taxol and carboplatin, today is cycle 4 Chemo toxicities: 1.  Elevated blood sugars.  Dexamethasone in spite of increasing her insulin dose: I reduce the dexamethasone to only 1 tablet to be taken before chemo.  I removed dexamethasone from today's infusion.  She has not had any nausea issues. 2. Fatigue 3.  Muscle aches and pains for a few days after chemo 4.  Diarrhea which she attributes to dexamethasone 5.  Tachycardia pulse rate in the 120s 6.  Renal insufficiency: I suspect it is because of elevated blood sugars.  She is trying her best to hydrate.  Lab review: CA 125 decreased to 16.1  CT abdomen 06/28/2018: No evidence of malignancy We will continue the current chemotherapy plan and she will return back in 3 weeks for cycle 5.

## 2018-07-10 NOTE — Progress Notes (Signed)
Patient Care Team: Monico Blitz, MD as PCP - General (Internal Medicine)  DIAGNOSIS: Endometrial cancer ([place])   SUMMARY OF ONCOLOGIC HISTORY:   Endometrial cancer ([place])   04/06/2018 Initial Diagnosis    She presented with severe abdominal pain    04/07/2018 Imaging    She presented to the emergency room after the pain woke her up CT imaging was performed 04/07/2018 [facility].  A 6.5 x 7 x 8.1 complex right adnexal mass was found with solid and fat components consistent with a teratoma.  No lymphadenopathy.  Small to moderate ascites and inflammatory changes were seen in the pelvis follow-up ultrasound was also performed showing concern for right ovarian torsion with a large ovarian mass cystic and solid components 9.7 cm.  A left ovarian cyst seen 3.4 cm.  Endometrial thickening with hypervascularity and endometrial thickness of 24 mm moderate pelvic free fluid.    04/07/2018 Surgery    Given the concern for torsion she was taken urgently to the operating room by Dr. Orson Ape. McLeod 04/07/2018.  At that time operative laparoscopy, torsion of the right adnexa was noted along with thickened abnormal endometrium.  She had right salpingo-oophorectomy through a laparoscopic approach along with endometrial biopsy.      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 Biopsy of left parametrial tissue and sigmoid peritoneum  Surgeon: Bernita Raisin, MD  Specimens: pelvic washings, parametrial biopsy, sigmoid peritoneum biopsy  Complications: none  Indication for Procedure:  Patient found at OSH to have right ovarian mass that was resected and found on permanent section to be c/w endometrial cancer. Also had endometrial biopsy showing the same.  Operative Findings: Significant adhesive disease almost miliary-like. The sigmoid was adherent to the left cornua. After takedown of the sigmoid in this region it was clear the left cornua was involved in an abnormal process including necrotic tissue clinically concerning for malignancy. In addition the bladder was adherent to this region. Posterior surface of the broad ligament in area c/w the parametria on the left showed likely tumor breakthrough. Images were taken. Biopsy from the tissue extruding from the left side of the uterus in the presumed parametrium was sent for frozen. This returned worrisome for disease, but not definitive. A separate area of the sigmoid was adherent inferior to the posterior surface lesion and the remnant of this adhesion was sent as sigmoid peritoneum. This, on frozen, returned with fibrosis.     04/24/2018 Pathology Results    1. Peritoneum, biopsy - METASTATIC CARCINOMA CONSISTENT WITH PATIENT'S CLINICAL HISTORY OF PRIMARY ENDOMETRIAL CARCINOMA. SEE NOTE 2. Soft tissue, biopsy, parametrial - METASTATIC CARCINOMA CONSISTENT WITH PATIENT'S CLINICAL HISTORY OF PRIMARY ENDOMETRIAL CARCINOMA. SEE NOTE Diagnosis Note 1. and 2. Immunohistochemical stains show that the tumor cells are positive for PAX8 and negative for calretinin, consistent with the above diagnosis    04/25/2018 Cancer Staging    Staging form: Corpus Uteri - Carcinoma and Carcinosarcoma, AJCC 8th Edition - Clinical: Stage IVA (  cT4, cN0, cM0) - Signed by Heath Lark, MD on 04/25/2018    05/02/2018 Tumor Marker    Patient's tumor was tested for the following markers: CA-125 Results of the tumor  marker test revealed 56.7    05/04/2018 -  Chemotherapy    The patient had carboplatin and Taxol    05/08/2018 Procedure    Placement of a power injectable Port-A-Cath    05/29/2018 Tumor Marker    Patient's tumor was tested for the following markers: CA-125 Results of the tumor marker test revealed 31.6    06/19/2018 Tumor Marker    Patient's tumor was tested for the following markers: CA-125 Results of the tumor marker test revealed 21.1    06/28/2018 Imaging    No evidence of malignancy or other acute findings within the abdomen or pelvis.  Moderate hepatic steatosis.     CHIEF COMPLIANT: Follow up of chemotherapy with Carbo/Taxol  INTERVAL HISTORY: Kathryn Stewart is a 37 y.o. with above-mentioned history of metastatic endometrial cancer and currently on chemotherapy  with Carbo/Taxol. She is under the care of Dr. Alvy Bimler and I am seeing the patient in her absence.  Her latest CT scan from 09/28/17 did not show any obvious findings.   She notes after chemo treatment she gets fatigue with occasional nausea. She uses dexa as needed which causes diarrhea, so she will take antidiarrheal medication for this. She notes she has been drinking plenty of water. She notes her glucose has been high lately from her dexa 2 tabs the day before and day of treatment. She is willing to reduce her dose.     REVIEW OF SYSTEMS:   Constitutional: Denies fevers, chills or abnormal weight loss (+) fatigue Eyes: Denies blurriness of vision Ears, nose, mouth, throat, and face: Denies mucositis or sore throat Respiratory: Denies cough, dyspnea or wheezes Cardiovascular: Denies palpitation, chest discomfort Gastrointestinal:  Denies heartburn or change in bowel habits (+) occasional nausea Skin: Denies abnormal skin rashes Lymphatics: Denies new lymphadenopathy or easy bruising Neurological:Denies numbness, tingling or new weaknesses Behavioral/Psych: Mood is stable, no new changes  Extremities: No lower  extremity edema Breast: Denies any pain or lumps or nodules in either breasts All other systems were reviewed with the patient and are negative.  I have reviewed the past medical history, past surgical history, social history and family history with the patient and they are unchanged from previous note.  ALLERGIES:  is allergic to latex and other.  MEDICATIONS:  Current Outpatient Medications  Medication Sig Dispense Refill  . amLODipine (NORVASC) 5 MG tablet Take 5 mg by mouth at bedtime.     Marland Kitchen dexamethasone (DECADRON) 4 MG tablet Take 3 tabs at the night before and 3 tab the morning of chemotherapy, every 3 weeks, by mouth 36 tablet 0  . empagliflozin (JARDIANCE) 10 MG TABS tablet Take 10 mg by mouth daily.     . insulin degludec (TRESIBA FLEXTOUCH) 100 UNIT/ML SOPN FlexTouch Pen Inject 33 Units into the skin daily.     Marland Kitchen lidocaine-prilocaine (EMLA) cream Apply 1 application topically as needed. 30 g 6  . lisinopril (PRINIVIL,ZESTRIL) 20 MG tablet Take 20 mg by mouth at bedtime.     . metFORMIN (GLUCOPHAGE) 500 MG tablet Take 1,000 mg by mouth 2 (two) times daily with a meal. 1 at lunch time and 1 at bedtime    . ondansetron (ZOFRAN) 8 MG tablet Take 1 tablet (8 mg total) by mouth every 8 (eight) hours as needed for nausea.  30 tablet 3  . prochlorperazine (COMPAZINE) 10 MG tablet TAKE 1 TABLET BY MOUTH EVERY 6 HOURS AS NEEDED FOR NAUSEA FOR VOMITING 30 tablet 0   No current facility-administered medications for this visit.     PHYSICAL EXAMINATION: ECOG PERFORMANCE STATUS: 1 - Symptomatic but completely ambulatory  Vitals:   07/10/18 0917  BP: 122/87  Pulse: (!) 128  Resp: 17  Temp: 98.3 F (36.8 C)  SpO2: 97%   Filed Weights   07/10/18 0917  Weight: 173 lb (78.5 kg)    GENERAL:alert, no distress and comfortable SKIN: skin color, texture, turgor are normal, no rashes or significant lesions EYES: normal, Conjunctiva are pink and non-injected, sclera clear OROPHARYNX:no  exudate, no erythema and lips, buccal mucosa, and tongue normal  NECK: supple, thyroid normal size, non-tender, without nodularity LYMPH:  no palpable lymphadenopathy in the cervical, axillary or inguinal LUNGS: clear to auscultation and percussion with normal breathing effort HEART: regular rate & rhythm and no murmurs and no lower extremity edema ABDOMEN:abdomen soft, non-tender and normal bowel sounds MUSCULOSKELETAL:no cyanosis of digits and no clubbing  NEURO: alert & oriented x 3 with fluent speech, no focal motor/sensory deficits EXTREMITIES: No lower extremity edema   LABORATORY DATA:  I have reviewed the data as listed CMP Latest Ref Rng & Units 07/10/2018 06/25/2018 06/19/2018  Glucose 70 - 99 mg/dL 285(H) 447(H) 389(H)  BUN 6 - 20 mg/dL 27(H) 22(H) 23(H)  Creatinine 0.44 - 1.00 mg/dL 1.03(H) 1.07(H) 1.05(H)  Sodium 135 - 145 mmol/L 138 138 141  Potassium 3.5 - 5.1 mmol/L 4.7 5.3(H) 4.6  Chloride 98 - 111 mmol/L 100 97(L) 102  CO2 22 - 32 mmol/L 22 23 20(L)  Calcium 8.9 - 10.3 mg/dL 10.8(H) 10.1 10.6(H)  Total Protein 6.5 - 8.1 g/dL 7.5 7.9 7.9  Total Bilirubin 0.3 - 1.2 mg/dL 0.3 0.3 0.3  Alkaline Phos 38 - 126 U/L 62 68 68  AST 15 - 41 U/L 18 16 14(L)  ALT 0 - 44 U/L 34 37 25    Lab Results  Component Value Date   WBC 9.7 07/10/2018   HGB 12.8 07/10/2018   HCT 38.5 07/10/2018   MCV 91.9 07/10/2018   PLT 193 07/10/2018   NEUTROABS 8.4 (H) 07/10/2018    ASSESSMENT & PLAN:  Endometrial cancer ([place]) Metastatic endometrial cancer involving peritoneal deposits. Current treatment: Taxol and carboplatin, today is cycle 4 Chemo toxicities: 1.  Elevated blood sugars.  Dexamethasone in spite of increasing her insulin dose: I reduce the dexamethasone to only 1 tablet to be taken before chemo.  I removed dexamethasone from today's infusion.  She has not had any nausea issues. 2. Fatigue 3.  Muscle aches and pains for a few days after chemo 4.  Diarrhea which she  attributes to dexamethasone 5.  Tachycardia pulse rate in the 120s 6.  Renal insufficiency: I suspect it is because of elevated blood sugars.  She is trying her best to hydrate.  Lab review: CA 125 decreased to 16.1  CT abdomen 06/28/2018: No evidence of malignancy We will continue the current chemotherapy plan and she will return back in 3 weeks for cycle 5.     No orders of the defined types were placed in this encounter.  The patient has a good understanding of the overall plan. she agrees with it. she will call with any problems that may develop before the next visit here.   Harriette Ohara, MD 07/10/2018   Oneal Deputy, am  acting as scribe for Nicholas Lose, MD.  I have reviewed the above documentation for accuracy and completeness, and I agree with the above.

## 2018-07-11 LAB — CA 125: CANCER ANTIGEN (CA) 125: 12.6 U/mL (ref 0.0–38.1)

## 2018-07-26 NOTE — Progress Notes (Signed)
Patient Care Team: Monico Blitz, MD as PCP - General (Internal Medicine)  DIAGNOSIS:    ICD-10-CM   1. Endometrial cancer ([place]) C54.1     SUMMARY OF ONCOLOGIC HISTORY:   Endometrial cancer ([place])   04/06/2018 Initial Diagnosis    She presented with severe abdominal pain    04/07/2018 Imaging    She presented to the emergency room after the pain woke her up CT imaging was performed 04/07/2018 [facility].  A 6.5 x 7 x 8.1 complex right adnexal mass was found with solid and fat components consistent with a teratoma.  No lymphadenopathy.  Small to moderate ascites and inflammatory changes were seen in the pelvis follow-up ultrasound was also performed showing concern for right ovarian torsion with a large ovarian mass cystic and solid components 9.7 cm.  A left ovarian cyst seen 3.4 cm.  Endometrial thickening with hypervascularity and endometrial thickness of 24 mm moderate pelvic free fluid.    04/07/2018 Surgery    Given the concern for torsion she was taken urgently to the operating room by Dr. Orson Ape. McLeod 04/07/2018.  At that time operative laparoscopy, torsion of the right adnexa was noted along with thickened abnormal endometrium.  She had right salpingo-oophorectomy through a laparoscopic approach along with endometrial biopsy.      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 Biopsy of left parametrial tissue and sigmoid  peritoneum  Surgeon: Bernita Raisin, MD  Specimens: pelvic washings, parametrial biopsy, sigmoid peritoneum biopsy  Complications: none  Indication for Procedure:  Patient found at OSH to have right ovarian mass that was resected and found on permanent section to be c/w endometrial cancer. Also had endometrial biopsy showing the same.  Operative Findings: Significant adhesive disease almost miliary-like. The sigmoid was adherent to the left cornua. After takedown of the sigmoid in this region it was clear the left cornua was involved in an abnormal process including necrotic tissue clinically concerning for malignancy. In addition the bladder was adherent to this region. Posterior surface of the broad ligament in area c/w the parametria on the left showed likely tumor breakthrough. Images were taken. Biopsy from the tissue extruding from the left side of the uterus in the presumed parametrium was sent for frozen. This returned worrisome for disease, but not definitive. A separate area of the sigmoid was adherent inferior to the posterior surface lesion and the remnant of this adhesion was sent as sigmoid peritoneum. This, on frozen, returned with fibrosis.     04/24/2018 Pathology Results    1. Peritoneum, biopsy - METASTATIC CARCINOMA CONSISTENT WITH PATIENT'S CLINICAL HISTORY OF PRIMARY ENDOMETRIAL CARCINOMA. SEE NOTE 2. Soft tissue, biopsy, parametrial - METASTATIC CARCINOMA CONSISTENT WITH PATIENT'S CLINICAL HISTORY OF PRIMARY ENDOMETRIAL CARCINOMA. SEE NOTE Diagnosis Note 1. and 2. Immunohistochemical stains show that the tumor cells are positive for PAX8 and negative for calretinin, consistent with the above diagnosis    04/25/2018 Cancer Staging    Staging form: Corpus Uteri -  Carcinoma and Carcinosarcoma, AJCC 8th Edition - Clinical: Stage IVA (cT4, cN0, cM0) - Signed by Heath Lark, MD on 04/25/2018    05/02/2018 Tumor Marker    Patient's tumor was tested for the following  markers: CA-125 Results of the tumor marker test revealed 56.7    05/04/2018 -  Chemotherapy    The patient had carboplatin and Taxol    05/08/2018 Procedure    Placement of a power injectable Port-A-Cath    05/29/2018 Tumor Marker    Patient's tumor was tested for the following markers: CA-125 Results of the tumor marker test revealed 31.6    06/19/2018 Tumor Marker    Patient's tumor was tested for the following markers: CA-125 Results of the tumor marker test revealed 21.1    06/28/2018 Imaging    No evidence of malignancy or other acute findings within the abdomen or pelvis.  Moderate hepatic steatosis.     CHIEF COMPLIANT: Follow-up for cycle 5 chemotherapy with Carbo/Taxol   INTERVAL HISTORY: Kathryn Stewart is a 37 y.o. with above-mentioned history of metastatic endometrial cancer and currently on chemotherapy  with Carbo/Taxol. She is under the care of Dr. Alvy Bimler and I am seeing the patient in her absence. She presents to the clinic today for cycle 5 of chemotherapy CT treatment. She presents to the clinic today alone. She notes her most recent treatment went well. Her tumor marker continues to decrease with each treatment. She denies tingling and numbness in hands or feet. She denies constipation. She notes occaisional nausea. Her most recent labs show Hg 11.9, platelets 130, WBC 7.8.   REVIEW OF SYSTEMS:   Constitutional: Denies fevers, chills or abnormal weight loss Eyes: Denies blurriness of vision Ears, nose, mouth, throat, and face: Denies mucositis or sore throat Respiratory: Denies cough, dyspnea or wheezes Cardiovascular: Denies palpitation, chest discomfort Gastrointestinal:  Denies nausea, heartburn or change in bowel habits (+) occasional nausea Skin: Denies abnormal skin rashes Lymphatics: Denies new lymphadenopathy or easy bruising Neurological:Denies numbness, tingling or new weaknesses Behavioral/Psych: Mood is stable, no new changes  Extremities: No lower  extremity edema Breast: denies any pain or lumps or nodules in either breasts All other systems were reviewed with the patient and are negative.  I have reviewed the past medical history, past surgical history, social history and family history with the patient and they are unchanged from previous note.  ALLERGIES:  is allergic to latex and other.  MEDICATIONS:  Current Outpatient Medications  Medication Sig Dispense Refill  . amLODipine (NORVASC) 5 MG tablet Take 5 mg by mouth at bedtime.     Marland Kitchen dexamethasone (DECADRON) 4 MG tablet Take 3 tabs at the night before and 3 tab the morning of chemotherapy, every 3 weeks, by mouth 36 tablet 0  . empagliflozin (JARDIANCE) 10 MG TABS tablet Take 10 mg by mouth daily.     . insulin degludec (TRESIBA FLEXTOUCH) 100 UNIT/ML SOPN FlexTouch Pen Inject 33 Units into the skin daily.     Marland Kitchen lidocaine-prilocaine (EMLA) cream Apply 1 application topically as needed. 30 g 6  . lisinopril (PRINIVIL,ZESTRIL) 20 MG tablet Take 20 mg by mouth at bedtime.     . metFORMIN (GLUCOPHAGE) 500 MG tablet Take 1,000 mg by mouth 2 (two) times daily with a meal. 1 at lunch time and 1 at bedtime    . ondansetron (ZOFRAN) 8 MG tablet Take 1 tablet (8 mg total) by mouth every 8 (eight) hours as needed for nausea. 30 tablet 3  .  prochlorperazine (COMPAZINE) 10 MG tablet TAKE 1 TABLET BY MOUTH EVERY 6 HOURS AS NEEDED FOR NAUSEA FOR VOMITING 30 tablet 0   No current facility-administered medications for this visit.     PHYSICAL EXAMINATION: ECOG PERFORMANCE STATUS: 1 - Symptomatic but completely ambulatory  Vitals:   07/31/18 0837  BP: (!) 135/97  Pulse: (!) 131  Resp: 18  Temp: 98.1 F (36.7 C)  SpO2: 99%   Filed Weights   07/31/18 0837  Weight: 173 lb 11.2 oz (78.8 kg)    GENERAL:alert, no distress and comfortable SKIN: skin color, texture, turgor are normal, no rashes or significant lesions EYES: normal, Conjunctiva are pink and non-injected, sclera  clear OROPHARYNX:no exudate, no erythema and lips, buccal mucosa, and tongue normal  NECK: supple, thyroid normal size, non-tender, without nodularity LYMPH:  no palpable lymphadenopathy in the cervical, axillary or inguinal LUNGS: clear to auscultation and percussion with normal breathing effort HEART: regular rate & rhythm and no murmurs and no lower extremity edema ABDOMEN:abdomen soft, non-tender and normal bowel sounds MUSCULOSKELETAL:no cyanosis of digits and no clubbing  NEURO: alert & oriented x 3 with fluent speech, no focal motor/sensory deficits EXTREMITIES: No lower extremity edema  LABORATORY DATA:  I have reviewed the data as listed CMP Latest Ref Rng & Units 07/10/2018 06/25/2018 06/19/2018  Glucose 70 - 99 mg/dL 285(H) 447(H) 389(H)  BUN 6 - 20 mg/dL 27(H) 22(H) 23(H)  Creatinine 0.44 - 1.00 mg/dL 1.03(H) 1.07(H) 1.05(H)  Sodium 135 - 145 mmol/L 138 138 141  Potassium 3.5 - 5.1 mmol/L 4.7 5.3(H) 4.6  Chloride 98 - 111 mmol/L 100 97(L) 102  CO2 22 - 32 mmol/L 22 23 20(L)  Calcium 8.9 - 10.3 mg/dL 10.8(H) 10.1 10.6(H)  Total Protein 6.5 - 8.1 g/dL 7.5 7.9 7.9  Total Bilirubin 0.3 - 1.2 mg/dL 0.3 0.3 0.3  Alkaline Phos 38 - 126 U/L 62 68 68  AST 15 - 41 U/L 18 16 14(L)  ALT 0 - 44 U/L 34 37 25    Lab Results  Component Value Date   WBC 7.8 07/31/2018   HGB 11.9 (L) 07/31/2018   HCT 35.0 (L) 07/31/2018   MCV 92.1 07/31/2018   PLT 113 (L) 07/31/2018   NEUTROABS 6.0 07/31/2018    ASSESSMENT & PLAN:  Endometrial cancer ([place]) Metastatic endometrial cancer involving peritoneal deposits.  Current treatment: Taxol and carboplatin, today is cycle 5  Chemo toxicities: 1.  Elevated blood sugars.  Dexamethasone in spite of increasing her insulin dose: I reduce the dexamethasone to only 1 tablet to be taken before chemo.  I removed dexamethasone from today's infusion.  She has not had any nausea issues. 2. Fatigue 3. Tachycardia pulse rate in the 120s-130s 4.  Renal  insufficiency: I suspect it is because of elevated blood sugars.  She is trying her best to hydrate.  Lab review: CA 125 decreased to 12.6 Platelet count decreased to 113.  Okay to treat  CT abdomen 06/28/2018: No evidence of malignancy We will continue the current chemotherapy plan and she will return back in 3 weeks for cycle 6 with Dr. Alvy Bimler.   No orders of the defined types were placed in this encounter.  The patient has a good understanding of the overall plan. she agrees with it. she will call with any problems that may develop before the next visit here.  Nicholas Lose, MD 07/31/2018   Julious Oka Dorshimer, am acting as scribe for Nicholas Lose, MD.  I have reviewed the  above documentation for accuracy and completeness, and I agree with the above.

## 2018-07-31 ENCOUNTER — Telehealth: Payer: Self-pay | Admitting: Hematology and Oncology

## 2018-07-31 ENCOUNTER — Inpatient Hospital Stay: Payer: BLUE CROSS/BLUE SHIELD

## 2018-07-31 ENCOUNTER — Inpatient Hospital Stay (HOSPITAL_BASED_OUTPATIENT_CLINIC_OR_DEPARTMENT_OTHER): Payer: BLUE CROSS/BLUE SHIELD | Admitting: Hematology and Oncology

## 2018-07-31 DIAGNOSIS — C541 Malignant neoplasm of endometrium: Secondary | ICD-10-CM | POA: Diagnosis not present

## 2018-07-31 DIAGNOSIS — R000 Tachycardia, unspecified: Secondary | ICD-10-CM

## 2018-07-31 DIAGNOSIS — R5383 Other fatigue: Secondary | ICD-10-CM | POA: Diagnosis not present

## 2018-07-31 DIAGNOSIS — N289 Disorder of kidney and ureter, unspecified: Secondary | ICD-10-CM

## 2018-07-31 DIAGNOSIS — C7961 Secondary malignant neoplasm of right ovary: Secondary | ICD-10-CM

## 2018-07-31 DIAGNOSIS — R739 Hyperglycemia, unspecified: Secondary | ICD-10-CM

## 2018-07-31 LAB — CBC WITH DIFFERENTIAL/PLATELET
Abs Immature Granulocytes: 0.18 10*3/uL — ABNORMAL HIGH (ref 0.00–0.07)
BASOS PCT: 0 %
Basophils Absolute: 0 10*3/uL (ref 0.0–0.1)
EOS ABS: 0 10*3/uL (ref 0.0–0.5)
Eosinophils Relative: 0 %
HCT: 35 % — ABNORMAL LOW (ref 36.0–46.0)
Hemoglobin: 11.9 g/dL — ABNORMAL LOW (ref 12.0–15.0)
IMMATURE GRANULOCYTES: 2 %
Lymphocytes Relative: 14 %
Lymphs Abs: 1.1 10*3/uL (ref 0.7–4.0)
MCH: 31.3 pg (ref 26.0–34.0)
MCHC: 34 g/dL (ref 30.0–36.0)
MCV: 92.1 fL (ref 80.0–100.0)
MONOS PCT: 5 %
Monocytes Absolute: 0.4 10*3/uL (ref 0.1–1.0)
NEUTROS PCT: 79 %
NRBC: 0.6 % — AB (ref 0.0–0.2)
Neutro Abs: 6 10*3/uL (ref 1.7–7.7)
PLATELETS: 113 10*3/uL — AB (ref 150–400)
RBC: 3.8 MIL/uL — AB (ref 3.87–5.11)
RDW: 18.6 % — AB (ref 11.5–15.5)
WBC: 7.8 10*3/uL (ref 4.0–10.5)

## 2018-07-31 LAB — COMPREHENSIVE METABOLIC PANEL
ALT: 27 U/L (ref 0–44)
AST: 13 U/L — AB (ref 15–41)
Albumin: 4 g/dL (ref 3.5–5.0)
Alkaline Phosphatase: 66 U/L (ref 38–126)
Anion gap: 15 (ref 5–15)
BUN: 20 mg/dL (ref 6–20)
CHLORIDE: 105 mmol/L (ref 98–111)
CO2: 22 mmol/L (ref 22–32)
Calcium: 10.2 mg/dL (ref 8.9–10.3)
Creatinine, Ser: 0.86 mg/dL (ref 0.44–1.00)
GFR calc non Af Amer: >60 mL/min (ref >60)
Glucose, Bld: 261 mg/dL — ABNORMAL HIGH (ref 70–99)
POTASSIUM: 4.8 mmol/L (ref 3.5–5.1)
SODIUM: 142 mmol/L (ref 135–145)
Total Bilirubin: 0.3 mg/dL (ref 0.3–1.2)
Total Protein: 7.7 g/dL (ref 6.5–8.1)

## 2018-07-31 MED ORDER — SODIUM CHLORIDE 0.9 % IV SOLN
Freq: Once | INTRAVENOUS | Status: DC
  Filled 2018-07-31: qty 5

## 2018-07-31 MED ORDER — FAMOTIDINE IN NACL 20-0.9 MG/50ML-% IV SOLN
20.0000 mg | Freq: Once | INTRAVENOUS | Status: AC
  Administered 2018-07-31: 20 mg via INTRAVENOUS

## 2018-07-31 MED ORDER — DIPHENHYDRAMINE HCL 50 MG/ML IJ SOLN
INTRAMUSCULAR | Status: AC
  Filled 2018-07-31: qty 1

## 2018-07-31 MED ORDER — SODIUM CHLORIDE 0.9 % IV SOLN
Freq: Once | INTRAVENOUS | Status: AC
  Administered 2018-07-31: 09:00:00 via INTRAVENOUS
  Filled 2018-07-31: qty 250

## 2018-07-31 MED ORDER — PALONOSETRON HCL INJECTION 0.25 MG/5ML
INTRAVENOUS | Status: AC
  Filled 2018-07-31: qty 5

## 2018-07-31 MED ORDER — SODIUM CHLORIDE 0.9% FLUSH
10.0000 mL | INTRAVENOUS | Status: DC | PRN
  Administered 2018-07-31: 10 mL
  Filled 2018-07-31: qty 10

## 2018-07-31 MED ORDER — FAMOTIDINE IN NACL 20-0.9 MG/50ML-% IV SOLN
INTRAVENOUS | Status: AC
  Filled 2018-07-31: qty 50

## 2018-07-31 MED ORDER — SODIUM CHLORIDE 0.9 % IV SOLN: 790.0000 mg | Freq: Once | INTRAVENOUS | Status: DC

## 2018-07-31 MED ORDER — SODIUM CHLORIDE 0.9 % IV SOLN
Freq: Once | INTRAVENOUS | Status: AC
  Administered 2018-07-31: 10:00:00 via INTRAVENOUS
  Filled 2018-07-31: qty 5

## 2018-07-31 MED ORDER — PALONOSETRON HCL INJECTION 0.25 MG/5ML
0.2500 mg | Freq: Once | INTRAVENOUS | Status: AC
  Administered 2018-07-31: 0.25 mg via INTRAVENOUS

## 2018-07-31 MED ORDER — HEPARIN SOD (PORK) LOCK FLUSH 100 UNIT/ML IV SOLN
500.0000 [IU] | Freq: Once | INTRAVENOUS | Status: AC | PRN
  Administered 2018-07-31: 500 [IU]
  Filled 2018-07-31: qty 5

## 2018-07-31 MED ORDER — SODIUM CHLORIDE 0.9 % IV SOLN
750.0000 mg | Freq: Once | INTRAVENOUS | Status: AC
  Administered 2018-07-31: 750 mg via INTRAVENOUS
  Filled 2018-07-31: qty 75

## 2018-07-31 MED ORDER — DIPHENHYDRAMINE HCL 50 MG/ML IJ SOLN
50.0000 mg | Freq: Once | INTRAMUSCULAR | Status: AC
  Administered 2018-07-31: 50 mg via INTRAVENOUS

## 2018-07-31 MED ORDER — SODIUM CHLORIDE 0.9 % IV SOLN
140.0000 mg/m2 | Freq: Once | INTRAVENOUS | Status: AC
  Administered 2018-07-31: 252 mg via INTRAVENOUS
  Filled 2018-07-31: qty 42

## 2018-07-31 NOTE — Telephone Encounter (Signed)
No 11/26 los °

## 2018-07-31 NOTE — Progress Notes (Signed)
Scr 0.86, AUC 6 Continue carboplatin Max dose of 750mg  per Dr. Lindi Adie.  Pt has slightly improved Scr.  Hardie Pulley, PharmD, BCPS, BCOP

## 2018-07-31 NOTE — Assessment & Plan Note (Signed)
Metastatic endometrial cancer involving peritoneal deposits.  Current treatment: Taxol and carboplatin, today is cycle 5  Chemo toxicities: 1.  Elevated blood sugars.  Dexamethasone in spite of increasing her insulin dose: I reduce the dexamethasone to only 1 tablet to be taken before chemo.  I removed dexamethasone from today's infusion.  She has not had any nausea issues. 2. Fatigue 3.  Muscle aches and pains for a few days after chemo 4.  Diarrhea which she attributes to dexamethasone 5.  Tachycardia pulse rate in the 120s 6.  Renal insufficiency: I suspect it is because of elevated blood sugars.  She is trying her best to hydrate.  Lab review: CA 125 decreased to 16.1  CT abdomen 06/28/2018: No evidence of malignancy We will continue the current chemotherapy plan and she will return back in 3 weeks for cycle 6.

## 2018-07-31 NOTE — Progress Notes (Signed)
Dr. Lindi Adie aware of HR - okay to proceed with treatment of Taxol and Carboplatin today.

## 2018-07-31 NOTE — Patient Instructions (Signed)
[facility] Discharge Instructions for Patients Receiving Chemotherapy  Today you received the following chemotherapy agents Taxol and Carboplatin.  To help prevent nausea and vomiting after your treatment, we encourage you to take your nausea medication as directed. NO ZOFRAN FOR 3 DAYS AFTER CHEMO.   If you develop nausea and vomiting that is not controlled by your nausea medication, call the clinic.   BELOW ARE SYMPTOMS THAT SHOULD BE REPORTED IMMEDIATELY:  *FEVER GREATER THAN 100.5 F  *CHILLS WITH OR WITHOUT FEVER  NAUSEA AND VOMITING THAT IS NOT CONTROLLED WITH YOUR NAUSEA MEDICATION  *UNUSUAL SHORTNESS OF BREATH  *UNUSUAL BRUISING OR BLEEDING  TENDERNESS IN MOUTH AND THROAT WITH OR WITHOUT PRESENCE OF ULCERS  *URINARY PROBLEMS  *BOWEL PROBLEMS  UNUSUAL RASH Items with * indicate a potential emergency and should be followed up as soon as possible.  Feel free to call the clinic you have any questions or concerns. The clinic phone number is [phone].  Please show the [facility] at check-in to the Emergency Department and triage nurse.

## 2018-08-01 LAB — CA 125: CANCER ANTIGEN (CA) 125: 10.4 U/mL (ref 0.0–38.1)

## 2018-08-03 ENCOUNTER — Telehealth: Payer: Self-pay | Admitting: Hematology and Oncology

## 2018-08-03 NOTE — Telephone Encounter (Signed)
Faxed medical records to [facility], Release [id number]

## 2018-08-15 ENCOUNTER — Telehealth: Payer: Self-pay | Admitting: *Deleted

## 2018-08-15 NOTE — Telephone Encounter (Signed)
Open accidentally

## 2018-08-21 ENCOUNTER — Inpatient Hospital Stay: Payer: BLUE CROSS/BLUE SHIELD | Attending: Obstetrics | Admitting: Hematology and Oncology

## 2018-08-21 ENCOUNTER — Inpatient Hospital Stay: Payer: BLUE CROSS/BLUE SHIELD

## 2018-08-21 ENCOUNTER — Encounter: Payer: Self-pay | Admitting: Hematology and Oncology

## 2018-08-21 ENCOUNTER — Telehealth: Payer: Self-pay | Admitting: Hematology and Oncology

## 2018-08-21 DIAGNOSIS — T451X5A Adverse effect of antineoplastic and immunosuppressive drugs, initial encounter: Secondary | ICD-10-CM | POA: Diagnosis not present

## 2018-08-21 DIAGNOSIS — Z5111 Encounter for antineoplastic chemotherapy: Secondary | ICD-10-CM | POA: Insufficient documentation

## 2018-08-21 DIAGNOSIS — D61818 Other pancytopenia: Secondary | ICD-10-CM | POA: Diagnosis not present

## 2018-08-21 DIAGNOSIS — C541 Malignant neoplasm of endometrium: Secondary | ICD-10-CM | POA: Diagnosis present

## 2018-08-21 DIAGNOSIS — K089 Disorder of teeth and supporting structures, unspecified: Secondary | ICD-10-CM | POA: Insufficient documentation

## 2018-08-21 DIAGNOSIS — D61810 Antineoplastic chemotherapy induced pancytopenia: Secondary | ICD-10-CM | POA: Diagnosis not present

## 2018-08-21 DIAGNOSIS — T451X5S Adverse effect of antineoplastic and immunosuppressive drugs, sequela: Secondary | ICD-10-CM | POA: Diagnosis not present

## 2018-08-21 DIAGNOSIS — G620 Drug-induced polyneuropathy: Secondary | ICD-10-CM | POA: Diagnosis not present

## 2018-08-21 DIAGNOSIS — R110 Nausea: Secondary | ICD-10-CM | POA: Diagnosis not present

## 2018-08-21 LAB — COMPREHENSIVE METABOLIC PANEL
ALT: 29 U/L (ref 0–44)
ANION GAP: 13 (ref 5–15)
AST: 18 U/L (ref 15–41)
Albumin: 3.9 g/dL (ref 3.5–5.0)
Alkaline Phosphatase: 63 U/L (ref 38–126)
BUN: 16 mg/dL (ref 6–20)
CALCIUM: 9.6 mg/dL (ref 8.9–10.3)
CO2: 23 mmol/L (ref 22–32)
CREATININE: 0.79 mg/dL (ref 0.44–1.00)
Chloride: 105 mmol/L (ref 98–111)
GFR calc Af Amer: >60 mL/min (ref >60)
GFR calc non Af Amer: >60 mL/min (ref >60)
Glucose, Bld: 195 mg/dL — ABNORMAL HIGH (ref 70–99)
Potassium: 4.2 mmol/L (ref 3.5–5.1)
Sodium: 141 mmol/L (ref 135–145)
TOTAL PROTEIN: 7.2 g/dL (ref 6.5–8.1)
Total Bilirubin: 0.3 mg/dL (ref 0.3–1.2)

## 2018-08-21 LAB — CBC WITH DIFFERENTIAL/PLATELET
Abs Immature Granulocytes: 0.02 10*3/uL (ref 0.00–0.07)
BASOS PCT: 0 %
Basophils Absolute: 0 10*3/uL (ref 0.0–0.1)
EOS ABS: 0 10*3/uL (ref 0.0–0.5)
Eosinophils Relative: 0 %
HCT: 28.8 % — ABNORMAL LOW (ref 36.0–46.0)
Hemoglobin: 9.9 g/dL — ABNORMAL LOW (ref 12.0–15.0)
IMMATURE GRANULOCYTES: 0 %
Lymphocytes Relative: 46 %
Lymphs Abs: 2.1 10*3/uL (ref 0.7–4.0)
MCH: 32.6 pg (ref 26.0–34.0)
MCHC: 34.4 g/dL (ref 30.0–36.0)
MCV: 94.7 fL (ref 80.0–100.0)
MONOS PCT: 9 %
Monocytes Absolute: 0.4 10*3/uL (ref 0.1–1.0)
NEUTROS PCT: 45 %
Neutro Abs: 2.1 10*3/uL (ref 1.7–7.7)
PLATELETS: 38 10*3/uL — AB (ref 150–400)
RBC: 3.04 MIL/uL — ABNORMAL LOW (ref 3.87–5.11)
RDW: 18.6 % — AB (ref 11.5–15.5)
WBC: 4.6 10*3/uL (ref 4.0–10.5)
nRBC: 0.7 % — ABNORMAL HIGH (ref 0.0–0.2)

## 2018-08-21 MED ORDER — AMOXICILLIN 500 MG PO TABS: 500.0000 mg | ORAL_TABLET | Freq: Two times a day (BID) | ORAL | 0 refills | Status: DC

## 2018-08-21 MED ORDER — PROCHLORPERAZINE MALEATE 10 MG PO TABS: ORAL_TABLET | ORAL | 1 refills | Status: DC

## 2018-08-21 MED ORDER — SODIUM CHLORIDE 0.9% FLUSH
10.0000 mL | Freq: Once | INTRAVENOUS | Status: AC
  Administered 2018-08-21: 10 mL
  Filled 2018-08-21: qty 10

## 2018-08-21 NOTE — Telephone Encounter (Signed)
Gave avs and calendar ° °

## 2018-08-21 NOTE — Assessment & Plan Note (Signed)
She is doing well with antiemetics as needed.  I refilled her prescription today

## 2018-08-21 NOTE — Assessment & Plan Note (Signed)
This is due to recent treatment I will delay chemotherapy by a week and plan to reduce the carboplatin dose to about AUC of 5

## 2018-08-21 NOTE — Progress Notes (Signed)
[place] OFFICE PROGRESS NOTE  Patient Care Team: Monico Blitz, MD as PCP - General (Internal Medicine)  ASSESSMENT & PLAN:  Endometrial cancer [facility]) Unfortunately, she has significant severe pancytopenia due to side effects of treatment We will delay chemo by 1 week Plan minor dose adjustment for future treatment She has appointment to see GYN oncologist with repeat imaging study I will get in touch with the surgeon to see if this needs to be adjusted  Pancytopenia, acquired [facility]) This is due to recent treatment I will delay chemotherapy by a week and plan to reduce the carboplatin dose to about AUC of 5  Nausea without vomiting She is doing well with antiemetics as needed.  I refilled her prescription today  Peripheral neuropathy due to chemotherapy [facility]) She has mild grade 1 neuropathy.  It is not severe.  Monitor only.  Poor dentition She has poor dentition with a broken tooth She is at risk of infection I gave her prescription amoxicillin to hang onto I do not recommend her to take it empirically She will continue aggressive dental hygiene and close follow-up with dentist   No orders of the defined types were placed in this encounter.   INTERVAL HISTORY: Please see below for problem oriented charting. She is seen prior to cycle 6 of treatment She complains of recent broken tooth and mild pain on her gum Denies fever or chills She denies significant neuropathy.  She has intermittent numbness only She have occasional nausea but no vomiting.  Antiemetics were controlling his symptoms She denies recent constipation The patient denies any recent signs or symptoms of bleeding such as spontaneous epistaxis, hematuria or hematochezia.   SUMMARY OF ONCOLOGIC HISTORY: Oncology History   Genetics neg     Endometrial cancer ([place])   04/06/2018 Initial Diagnosis    She presented with severe abdominal pain    04/07/2018 Imaging    She presented to the  emergency room after the pain woke her up CT imaging was performed 04/07/2018 [facility].  A 6.5 x 7 x 8.1 complex right adnexal mass was found with solid and fat components consistent with a teratoma.  No lymphadenopathy.  Small to moderate ascites and inflammatory changes were seen in the pelvis follow-up ultrasound was also performed showing concern for right ovarian torsion with a large ovarian mass cystic and solid components 9.7 cm.  A left ovarian cyst seen 3.4 cm.  Endometrial thickening with hypervascularity and endometrial thickness of 24 mm moderate pelvic free fluid.    04/07/2018 Surgery    Given the concern for torsion she was taken urgently to the operating room by Dr. Orson Ape. McLeod 04/07/2018.  At that time operative laparoscopy, torsion of the right adnexa was noted along with thickened abnormal endometrium.  She had right salpingo-oophorectomy through a laparoscopic approach along with endometrial biopsy.      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 Biopsy of left parametrial tissue and sigmoid peritoneum  Surgeon:  Bernita Raisin, MD  Specimens: pelvic washings, parametrial biopsy, sigmoid peritoneum biopsy  Complications: none  Indication for Procedure:  Patient found at OSH to have right ovarian mass that was resected and found on permanent section to be c/w endometrial cancer. Also had endometrial biopsy  showing the same.  Operative Findings: Significant adhesive disease almost miliary-like. The sigmoid was adherent to the left cornua. After takedown of the sigmoid in this region it was clear the left cornua was involved in an abnormal process including necrotic tissue clinically concerning for malignancy. In addition the bladder was adherent to this region. Posterior surface of the broad ligament in area c/w the parametria on the left showed likely tumor breakthrough. Images were taken. Biopsy from the tissue extruding from the left side of the uterus in the presumed parametrium was sent for frozen. This returned worrisome for disease, but not definitive. A separate area of the sigmoid was adherent inferior to the posterior surface lesion and the remnant of this adhesion was sent as sigmoid peritoneum. This, on frozen, returned with fibrosis.     04/24/2018 Pathology Results    1. Peritoneum, biopsy - METASTATIC CARCINOMA CONSISTENT WITH PATIENT'S CLINICAL HISTORY OF PRIMARY ENDOMETRIAL CARCINOMA. SEE NOTE 2. Soft tissue, biopsy, parametrial - METASTATIC CARCINOMA CONSISTENT WITH PATIENT'S CLINICAL HISTORY OF PRIMARY ENDOMETRIAL CARCINOMA. SEE NOTE Diagnosis Note 1. and 2. Immunohistochemical stains show that the tumor cells are positive for PAX8 and negative for calretinin, consistent with the above diagnosis    04/25/2018 Cancer Staging    Staging form: Corpus Uteri - Carcinoma and Carcinosarcoma, AJCC 8th Edition - Clinical: Stage IVA (cT4, cN0, cM0) - Signed by Heath Lark, MD on 04/25/2018    05/02/2018 Tumor Marker    Patient's tumor was tested for the following markers: CA-125 Results of the tumor marker test revealed 56.7    05/04/2018 -  Chemotherapy    The patient had carboplatin and Taxol    05/08/2018 Procedure    Placement of a power injectable Port-A-Cath    05/29/2018 Tumor Marker    Patient's tumor was tested for the following markers: CA-125 Results of the tumor marker test  revealed 31.6    06/19/2018 Tumor Marker    Patient's tumor was tested for the following markers: CA-125 Results of the tumor marker test revealed 21.1    06/28/2018 Imaging    No evidence of malignancy or other acute findings within the abdomen or pelvis.  Moderate hepatic steatosis.    07/10/2018 Tumor Marker    Patient's tumor was tested for the following markers: CA-125 Results of the tumor marker test revealed 12.6     REVIEW OF SYSTEMS:   Constitutional: Denies fevers, chills or abnormal weight loss Eyes: Denies blurriness of vision Ears, nose, mouth, throat, and face: Denies mucositis or sore throat Respiratory: Denies cough, dyspnea or wheezes Cardiovascular: Denies palpitation, chest discomfort or lower extremity swelling Gastrointestinal:  Denies nausea, heartburn or change in bowel habits Skin: Denies abnormal skin rashes Lymphatics: Denies new lymphadenopathy or easy bruising Neurological:Denies numbness, tingling or new weaknesses Behavioral/Psych: Mood is stable, no new changes  All other systems were reviewed with the patient and are negative.  I have reviewed the past medical history, past surgical history, social history and family history with the patient and they are unchanged from previous note.  ALLERGIES:  is allergic to latex and other.  MEDICATIONS:  Current Outpatient Medications  Medication Sig Dispense Refill  . amLODipine (NORVASC) 5 MG tablet Take 5 mg  by mouth at bedtime.     Marland Kitchen amoxicillin (AMOXIL) 500 MG tablet Take 1 tablet (500 mg total) by mouth 2 (two) times daily. 14 tablet 0  . dexamethasone (DECADRON) 4 MG tablet Take 3 tabs at the night before and 3 tab the morning of chemotherapy, every 3 weeks, by mouth 36 tablet 0  . empagliflozin (JARDIANCE) 10 MG TABS tablet Take 10 mg by mouth daily.     . insulin degludec (TRESIBA FLEXTOUCH) 100 UNIT/ML SOPN FlexTouch Pen Inject 33 Units into the skin daily.     Marland Kitchen lidocaine-prilocaine (EMLA) cream  Apply 1 application topically as needed. 30 g 6  . lisinopril (PRINIVIL,ZESTRIL) 20 MG tablet Take 20 mg by mouth at bedtime.     . metFORMIN (GLUCOPHAGE) 500 MG tablet Take 1,000 mg by mouth 2 (two) times daily with a meal. 1 at lunch time and 1 at bedtime    . ondansetron (ZOFRAN) 8 MG tablet Take 1 tablet (8 mg total) by mouth every 8 (eight) hours as needed for nausea. 30 tablet 3  . prochlorperazine (COMPAZINE) 10 MG tablet TAKE 1 TABLET BY MOUTH EVERY 6 HOURS AS NEEDED FOR NAUSEA FOR VOMITING 30 tablet 1   No current facility-administered medications for this visit.     PHYSICAL EXAMINATION: ECOG PERFORMANCE STATUS: 1 - Symptomatic but completely ambulatory  Vitals:   08/21/18 1101  BP: 119/84  Pulse: (!) 128  Resp: 18  Temp: 98.3 F (36.8 C)  SpO2: 100%   Filed Weights   08/21/18 1101  Weight: 176 lb 12.8 oz (80.2 kg)    GENERAL:alert, no distress and comfortable SKIN: skin color, texture, turgor are normal, no rashes or significant lesions EYES: normal, Conjunctiva are pink and non-injected, sclera clear OROPHARYNX:no exudate, no erythema and lips, buccal mucosa, and tongue normal.  Poor dentition is noted on the left lower jaw NECK: supple, thyroid normal size, non-tender, without nodularity LYMPH:  no palpable lymphadenopathy in the cervical, axillary or inguinal LUNGS: clear to auscultation and percussion with normal breathing effort HEART: regular rate & rhythm and no murmurs and no lower extremity edema ABDOMEN:abdomen soft, non-tender and normal bowel sounds Musculoskeletal:no cyanosis of digits and no clubbing  NEURO: alert & oriented x 3 with fluent speech, no focal motor/sensory deficits  LABORATORY DATA:  I have reviewed the data as listed    Component Value Date/Time   NA 141 08/21/2018 1053   K 4.2 08/21/2018 1053   CL 105 08/21/2018 1053   CO2 23 08/21/2018 1053   GLUCOSE 195 (H) 08/21/2018 1053   BUN 16 08/21/2018 1053   CREATININE 0.79 08/21/2018  1053   CALCIUM 9.6 08/21/2018 1053   PROT 7.2 08/21/2018 1053   ALBUMIN 3.9 08/21/2018 1053   AST 18 08/21/2018 1053   ALT 29 08/21/2018 1053   ALKPHOS 63 08/21/2018 1053   BILITOT 0.3 08/21/2018 1053   GFRNONAA >60 08/21/2018 1053   GFRAA >60 08/21/2018 1053    No results found for: SPEP, UPEP  Lab Results  Component Value Date   WBC 4.6 08/21/2018   NEUTROABS 2.1 08/21/2018   HGB 9.9 (L) 08/21/2018   HCT 28.8 (L) 08/21/2018   MCV 94.7 08/21/2018   PLT 38 (L) 08/21/2018      Chemistry      Component Value Date/Time   NA 141 08/21/2018 1053   K 4.2 08/21/2018 1053   CL 105 08/21/2018 1053   CO2 23 08/21/2018 1053   BUN 16 08/21/2018 1053  CREATININE 0.79 08/21/2018 1053      Component Value Date/Time   CALCIUM 9.6 08/21/2018 1053   ALKPHOS 63 08/21/2018 1053   AST 18 08/21/2018 1053   ALT 29 08/21/2018 1053   BILITOT 0.3 08/21/2018 1053       All questions were answered. The patient knows to call the clinic with any problems, questions or concerns. No barriers to learning was detected.  I spent 25 minutes counseling the patient face to face. The total time spent in the appointment was 40 minutes and more than 50% was on counseling and review of test results  Heath Lark, MD 08/21/2018 11:28 AM

## 2018-08-21 NOTE — Assessment & Plan Note (Signed)
She has poor dentition with a broken tooth She is at risk of infection I gave her prescription amoxicillin to hang onto I do not recommend her to take it empirically She will continue aggressive dental hygiene and close follow-up with dentist

## 2018-08-21 NOTE — Assessment & Plan Note (Signed)
She has mild grade 1 neuropathy.  It is not severe.  Monitor only.

## 2018-08-21 NOTE — Assessment & Plan Note (Signed)
Unfortunately, she has significant severe pancytopenia due to side effects of treatment We will delay chemo by 1 week Plan minor dose adjustment for future treatment She has appointment to see GYN oncologist with repeat imaging study I will get in touch with the surgeon to see if this needs to be adjusted

## 2018-08-22 ENCOUNTER — Telehealth: Payer: Self-pay | Admitting: *Deleted

## 2018-08-22 LAB — CA 125: Cancer Antigen (CA) 125: 7.9 U/mL (ref 0.0–38.1)

## 2018-08-22 NOTE — Telephone Encounter (Signed)
Per Dr.Phelps rescheduled the MRI and follow up visit. Appts moved and patient notified

## 2018-08-24 ENCOUNTER — Ambulatory Visit (HOSPITAL_COMMUNITY): Payer: BLUE CROSS/BLUE SHIELD

## 2018-08-28 ENCOUNTER — Inpatient Hospital Stay: Payer: BLUE CROSS/BLUE SHIELD

## 2018-08-28 ENCOUNTER — Other Ambulatory Visit: Payer: Self-pay | Admitting: Hematology and Oncology

## 2018-08-28 VITALS — BP 136/92 | HR 100 | Temp 98.5°F | Resp 18

## 2018-08-28 DIAGNOSIS — C541 Malignant neoplasm of endometrium: Secondary | ICD-10-CM

## 2018-08-28 DIAGNOSIS — Z5111 Encounter for antineoplastic chemotherapy: Secondary | ICD-10-CM | POA: Diagnosis not present

## 2018-08-28 LAB — CBC WITH DIFFERENTIAL/PLATELET
ABS IMMATURE GRANULOCYTES: 0.17 10*3/uL — AB (ref 0.00–0.07)
Basophils Absolute: 0 10*3/uL (ref 0.0–0.1)
Basophils Relative: 0 %
Eosinophils Absolute: 0 10*3/uL (ref 0.0–0.5)
Eosinophils Relative: 0 %
HCT: 28.4 % — ABNORMAL LOW (ref 36.0–46.0)
Hemoglobin: 9.8 g/dL — ABNORMAL LOW (ref 12.0–15.0)
Immature Granulocytes: 2 %
Lymphocytes Relative: 19 %
Lymphs Abs: 1.6 10*3/uL (ref 0.7–4.0)
MCH: 33.6 pg (ref 26.0–34.0)
MCHC: 34.5 g/dL (ref 30.0–36.0)
MCV: 97.3 fL (ref 80.0–100.0)
Monocytes Absolute: 0.6 10*3/uL (ref 0.1–1.0)
Monocytes Relative: 8 %
NEUTROS ABS: 5.8 10*3/uL (ref 1.7–7.7)
NEUTROS PCT: 71 %
PLATELETS: 119 10*3/uL — AB (ref 150–400)
RBC: 2.92 MIL/uL — ABNORMAL LOW (ref 3.87–5.11)
RDW: 19.4 % — ABNORMAL HIGH (ref 11.5–15.5)
WBC: 8.1 10*3/uL (ref 4.0–10.5)
nRBC: 1.4 % — ABNORMAL HIGH (ref 0.0–0.2)

## 2018-08-28 LAB — COMPREHENSIVE METABOLIC PANEL
ALT: 25 U/L (ref 0–44)
ANION GAP: 15 (ref 5–15)
AST: 11 U/L — ABNORMAL LOW (ref 15–41)
Albumin: 4 g/dL (ref 3.5–5.0)
Alkaline Phosphatase: 58 U/L (ref 38–126)
BUN: 35 mg/dL — ABNORMAL HIGH (ref 6–20)
CO2: 21 mmol/L — ABNORMAL LOW (ref 22–32)
Calcium: 10 mg/dL (ref 8.9–10.3)
Chloride: 104 mmol/L (ref 98–111)
Creatinine, Ser: 0.92 mg/dL (ref 0.44–1.00)
GFR calc Af Amer: >60 mL/min (ref >60)
GFR calc non Af Amer: >60 mL/min (ref >60)
GLUCOSE: 202 mg/dL — AB (ref 70–99)
Potassium: 4.5 mmol/L (ref 3.5–5.1)
Sodium: 140 mmol/L (ref 135–145)
Total Bilirubin: 0.3 mg/dL (ref 0.3–1.2)
Total Protein: 7.5 g/dL (ref 6.5–8.1)

## 2018-08-28 MED ORDER — PALONOSETRON HCL INJECTION 0.25 MG/5ML
0.2500 mg | Freq: Once | INTRAVENOUS | Status: AC
  Administered 2018-08-28: 0.25 mg via INTRAVENOUS

## 2018-08-28 MED ORDER — SODIUM CHLORIDE 0.9 % IV SOLN
Freq: Once | INTRAVENOUS | Status: AC
  Administered 2018-08-28: 10:00:00 via INTRAVENOUS
  Filled 2018-08-28: qty 250

## 2018-08-28 MED ORDER — SODIUM CHLORIDE 0.9 % IV SOLN
140.0000 mg/m2 | Freq: Once | INTRAVENOUS | Status: AC
  Administered 2018-08-28: 252 mg via INTRAVENOUS
  Filled 2018-08-28: qty 42

## 2018-08-28 MED ORDER — SODIUM CHLORIDE 0.9 % IV SOLN
620.0000 mg | Freq: Once | INTRAVENOUS | Status: AC
  Administered 2018-08-28: 620 mg via INTRAVENOUS
  Filled 2018-08-28: qty 62

## 2018-08-28 MED ORDER — DIPHENHYDRAMINE HCL 50 MG/ML IJ SOLN
50.0000 mg | Freq: Once | INTRAMUSCULAR | Status: AC
  Administered 2018-08-28: 50 mg via INTRAVENOUS

## 2018-08-28 MED ORDER — SODIUM CHLORIDE 0.9% FLUSH
10.0000 mL | INTRAVENOUS | Status: DC | PRN
  Administered 2018-08-28: 10 mL
  Filled 2018-08-28: qty 10

## 2018-08-28 MED ORDER — SODIUM CHLORIDE 0.9 % IV SOLN
Freq: Once | INTRAVENOUS | Status: AC
  Administered 2018-08-28: 11:00:00 via INTRAVENOUS
  Filled 2018-08-28: qty 5

## 2018-08-28 MED ORDER — DIPHENHYDRAMINE HCL 50 MG/ML IJ SOLN
INTRAMUSCULAR | Status: AC
  Filled 2018-08-28: qty 1

## 2018-08-28 MED ORDER — HEPARIN SOD (PORK) LOCK FLUSH 100 UNIT/ML IV SOLN
500.0000 [IU] | Freq: Once | INTRAVENOUS | Status: AC | PRN
  Administered 2018-08-28: 500 [IU]
  Filled 2018-08-28: qty 5

## 2018-08-28 MED ORDER — FAMOTIDINE IN NACL 20-0.9 MG/50ML-% IV SOLN
20.0000 mg | Freq: Once | INTRAVENOUS | Status: AC
  Administered 2018-08-28: 20 mg via INTRAVENOUS

## 2018-08-28 MED ORDER — FAMOTIDINE IN NACL 20-0.9 MG/50ML-% IV SOLN
INTRAVENOUS | Status: AC
  Filled 2018-08-28: qty 50

## 2018-08-28 MED ORDER — SODIUM CHLORIDE 0.9% FLUSH
10.0000 mL | Freq: Once | INTRAVENOUS | Status: AC
  Administered 2018-08-28: 10 mL
  Filled 2018-08-28: qty 10

## 2018-08-28 MED ORDER — PALONOSETRON HCL INJECTION 0.25 MG/5ML
INTRAVENOUS | Status: AC
  Filled 2018-08-28: qty 5

## 2018-08-28 NOTE — Patient Instructions (Signed)
[place] Discharge Instructions for Patients Receiving Chemotherapy  Today you received the following chemotherapy agents: Taxol and Carboplatin.  To help prevent nausea and vomiting after your treatment, we encourage you to take your nausea medication as directed. NO ZOFRAN FOR 3 DAYS AFTER CHEMO.   If you develop nausea and vomiting that is not controlled by your nausea medication, call the clinic.   BELOW ARE SYMPTOMS THAT SHOULD BE REPORTED IMMEDIATELY:  *FEVER GREATER THAN 100.5 F  *CHILLS WITH OR WITHOUT FEVER  NAUSEA AND VOMITING THAT IS NOT CONTROLLED WITH YOUR NAUSEA MEDICATION  *UNUSUAL SHORTNESS OF BREATH  *UNUSUAL BRUISING OR BLEEDING  TENDERNESS IN MOUTH AND THROAT WITH OR WITHOUT PRESENCE OF ULCERS  *URINARY PROBLEMS  *BOWEL PROBLEMS  UNUSUAL RASH Items with * indicate a potential emergency and should be followed up as soon as possible.  Feel free to call the clinic you have any questions or concerns. The clinic phone number is [phone].  Please show the [place] at check-in to the Emergency Department and triage nurse.

## 2018-08-29 LAB — CA 125: Cancer Antigen (CA) 125: 7.4 U/mL (ref 0.0–38.1)

## 2018-08-31 ENCOUNTER — Telehealth: Payer: Self-pay | Admitting: *Deleted

## 2018-09-03 ENCOUNTER — Ambulatory Visit (HOSPITAL_COMMUNITY)
Admission: RE | Admit: 2018-09-03 | Discharge: 2018-09-03 | Disposition: A | Discharge status: Home / Self Care | Payer: BLUE CROSS/BLUE SHIELD | Source: Ambulatory Visit | Attending: Obstetrics | Admitting: Obstetrics

## 2018-09-03 ENCOUNTER — Ambulatory Visit: Payer: BLUE CROSS/BLUE SHIELD | Admitting: Obstetrics

## 2018-09-03 ENCOUNTER — Telehealth: Payer: Self-pay | Admitting: Oncology

## 2018-09-03 DIAGNOSIS — C541 Malignant neoplasm of endometrium: Secondary | ICD-10-CM | POA: Diagnosis present

## 2018-09-03 MED ORDER — GADOBUTROL 1 MMOL/ML IV SOLN
10.0000 mL | Freq: Once | INTRAVENOUS | Status: AC | PRN
  Administered 2018-09-03: 10 mL via INTRAVENOUS

## 2018-09-03 NOTE — Telephone Encounter (Signed)
Called Kathryn Stewart and asked if she would like to have surgery on 09/20/18 or 09/25/18.  She said she preferred 09/20/18.

## 2018-09-11 ENCOUNTER — Inpatient Hospital Stay: Payer: BLUE CROSS/BLUE SHIELD | Attending: Obstetrics | Admitting: Obstetrics

## 2018-09-11 ENCOUNTER — Other Ambulatory Visit: Payer: Self-pay | Admitting: Gynecologic Oncology

## 2018-09-11 ENCOUNTER — Encounter: Payer: Self-pay | Admitting: Obstetrics

## 2018-09-11 VITALS — BP 123/82 | HR 105 | Temp 98.0°F | Resp 18 | Ht 61.5 in | Wt 176.0 lb

## 2018-09-11 DIAGNOSIS — C541 Malignant neoplasm of endometrium: Secondary | ICD-10-CM | POA: Diagnosis present

## 2018-09-11 DIAGNOSIS — Z90710 Acquired absence of both cervix and uterus: Secondary | ICD-10-CM | POA: Insufficient documentation

## 2018-09-11 DIAGNOSIS — C7961 Secondary malignant neoplasm of right ovary: Secondary | ICD-10-CM | POA: Diagnosis not present

## 2018-09-11 DIAGNOSIS — C786 Secondary malignant neoplasm of retroperitoneum and peritoneum: Secondary | ICD-10-CM

## 2018-09-11 DIAGNOSIS — Z90722 Acquired absence of ovaries, bilateral: Secondary | ICD-10-CM | POA: Insufficient documentation

## 2018-09-11 DIAGNOSIS — Z9221 Personal history of antineoplastic chemotherapy: Secondary | ICD-10-CM

## 2018-09-11 NOTE — Patient Instructions (Signed)
Preparing for your Surgery  Plan for surgery on September 20, 2018 with Dr. Precious Haws at [place] will be scheduled for a robotic assisted total hysterectomy, bilateral salpingo-oophorectomy.   Pre-operative Testing -You will receive a phone call from presurgical testing at [facility] if you have not received a call already to arrange for a pre-operative testing appointment before your surgery.  This appointment normally occurs one to two weeks before your scheduled surgery.   -Bring your insurance card, copy of an advanced directive if applicable, medication list  -At that visit, you will be asked to sign a consent for a possible blood transfusion in case a transfusion becomes necessary during surgery.  The need for a blood transfusion is rare but having consent is a necessary part of your care.     -You should not be taking blood thinners or aspirin at least ten days prior to surgery unless instructed by your surgeon.  -As part of our enhanced surgical recovery pathway, you may be advised to drink a carbohydrate drink the morning of surgery (at least 3 hours before). If you are diabetic, this will be avoided in order to prevent elevated glucose levels.  Day Before Surgery at [place] will be asked to take in a light diet the day before surgery.  Avoid carbonated beverages.  You will be advised to have nothing to eat or drink after midnight the evening before.    Eat a light diet the day before surgery.  Examples including soups, broths, toast, yogurt, mashed potatoes.  Things to avoid include carbonated beverages (fizzy beverages), raw fruits and raw vegetables, or beans.   If your bowels are filled with gas, your surgeon will have difficulty visualizing your pelvic organs which increases your surgical risks.  Your role in recovery Your role is to become active as soon as directed by your doctor, while still giving yourself time to heal.  Rest when you feel  tired. You will be asked to do the following in order to speed your recovery:  - Cough and breathe deeply. This helps toclear and expand your lungs and can prevent pneumonia. You may be given a spirometer to practice deep breathing. A staff member will show you how to use the spirometer. - Do mild physical activity. Walking or moving your legs help your circulation and body functions return to normal. A staff member will help you when you try to walk and will provide you with simple exercises. Do not try to get up or walk alone the first time. - Actively manage your pain. Managing your pain lets you move in comfort. We will ask you to rate your pain on a scale of zero to 10. It is your responsibility to tell your doctor or nurse where and how much you hurt so your pain can be treated.  Special Considerations -If you are diabetic, you may be placed on insulin after surgery to have closer control over your blood sugars to promote healing and recovery.  This does not mean that you will be discharged on insulin.  If applicable, your oral antidiabetics will be resumed when you are tolerating a solid diet.  -Your final pathology results from surgery should be available around one week after surgery and the results will be relayed to you when available.  -Dr. Lahoma Crocker is the Surgeon that assists your GYN Oncologist with surgery.  The next day after your surgery you will either see your GYN Oncologist, Dr. Terrence Dupont  Denman George, or Dr. Lahoma Crocker.  -FMLA forms can be faxed to [phone] and please allow 5-7 business days for completion.  Pain Management After Surgery -Make sure that you have Tylenol and Ibuprofen at home to use on a regular basis after surgery for pain control. We recommend alternating the medications every hour to six hours since they work differently and are processed in the body differently for pain relief.  Bowel Regimen  It is important to prevent constipation and drink  adequate amounts of liquids.

## 2018-09-11 NOTE — H&P (View-Only) (Signed)
[place] at [facility]   Progress Note: Established Patient Follow-Up   Consult was originally requested by Dr. Rubbie Battiest for an adnexal mass that was resected and found to be consistent with endometrioid adenocarcinoma.  In addition an endometrial biopsy showing the same.  Chief Complaint  Patient presents with  . Endometrial ca [facility])   GYN Oncologic Summary 1. 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 Possible parametrial involvement  05/04/18 - 08/28/18 Carbo/Taxol x 6 cycles (had one week hold C6 due to thrombo)  Surgery planned 2. Genetics with [facility] - no pathogenic mutations.  HPI: [name]  is a very nice 38 y.o.  P0   Interval History  She has treatment related alopecia. No complaints. Denies bleeding, denies pain.  She is here to review her MRI and discuss next steps in management. Since her last visit she completed her chemo   Oncologic Course She was in her usual state of health until the evening of 04/06/18 at which time she woke up with significant right lower quadrant pain.  She presented to the emergency room after the pain woke her up CT imaging was performed 04/07/2018 [facility] rocking him.  A 6.5 x 7 x 8.1 complex right adnexal mass was found with solid and fat components consistent with a teratoma.  No lymphadenopathy.  Small to moderate ascites and inflammatory changes were seen in the pelvis follow-up ultrasound was also performed showing concern for right ovarian torsion with a large ovarian mass cystic and solid components 9.7 cm.  A left ovarian cyst seen 3.4 cm.  Endometrial thickening with hypervascularity and endometrial thickness of 24 mm moderate pelvic free fluid.  Given the concern for torsion she was taken urgently to the operating room by Dr. Orson Ape. McLeod 04/07/2018.  At that time  operative laparoscopy with right salpingooophorectomy and findings consistent with torsion was performed.  In addition endometrial biopsy was performed.  Unfortunately final pathology reveals in the right ovary and endometrioid type adenocarcinoma the endometrial biopsy showed endometrioid type adenocarcinoma.  Comments include a favoring of endometrium as the primary.  The patient is therefore referred for recommendations and probable management.  She denied nausea , vomiting, denied recent bloating or pain denied any changes in her bowel habits or urinary habits.  She denies excess weight loss and she states she has a normal appetite.  She does admit to a long history of abnormal menses.  She states that she started her menstrual cycle at age 10.  There is documentation on the chart of possible PCOS.  Periods are described as both irregular and heavy with episodes intermittently of amenorrhea.  I took her to surgery 04/24/18 planning to do a completion surgery however she had left cornual/serosal disease fusing to the sigmoid and possibly emanating from her left parametrium. Intraoperative findings as follows: Significant adhesive disease almost miliary-like. The sigmoid was adherent to the left cornua. After takedown of the sigmoid in this region it was clear the left cornua was involved in an abnormal process including necrotic tissue clinically concerning for malignancy. In addition the bladder was adherent to this region. Posterior surface of the broad ligament in area c/w the parametria on the left showed likely tumor breakthrough. Images were taken. Biopsy from the tissue extruding from the left side of the uterus in the presumed parametrium was sent for frozen. This returned worrisome for  disease, but not definitive. A separate area of the sigmoid was adherent inferior to the posterior surface lesion and the remnant of this adhesion was sent as sigmoid peritoneum. Tissue biopsies as  follows: Peritoneum, biopsy (Sigmoid peritoneum) - METASTATIC CARCINOMA CONSISTENT WITH PATIENT'S CLINICAL HISTORY OF PRIMARY ENDOMETRIAL CARCINOMA. SEE NOTE 2. Soft tissue, biopsy, parametrial - METASTATIC CARCINOMA CONSISTENT WITH PATIENT'S CLINICAL HISTORY OF PRIMARY ENDOMETRIAL CARCINOMA. SEE NOTE  She started chemotherapy with Carboplatin/Taxol 05/04/18   Imported EPIC Oncologic History:  Oncology History   Genetics neg     Endometrial cancer ([place])   04/06/2018 Initial Diagnosis    She presented with severe abdominal pain    04/07/2018 Imaging    She presented to the emergency room after the pain woke her up CT imaging was performed 04/07/2018 [facility].  A 6.5 x 7 x 8.1 complex right adnexal mass was found with solid and fat components consistent with a teratoma.  No lymphadenopathy.  Small to moderate ascites and inflammatory changes were seen in the pelvis follow-up ultrasound was also performed showing concern for right ovarian torsion with a large ovarian mass cystic and solid components 9.7 cm.  A left ovarian cyst seen 3.4 cm.  Endometrial thickening with hypervascularity and endometrial thickness of 24 mm moderate pelvic free fluid.    04/07/2018 Surgery    Given the concern for torsion she was taken urgently to the operating room by Dr. Orson Ape. McLeod 04/07/2018.  At that time operative laparoscopy, torsion of the right adnexa was noted along with thickened abnormal endometrium.  She had right salpingo-oophorectomy through a laparoscopic approach along with endometrial biopsy.      04/07/2018 Pathology Results    Right ovary showed adenocarcinoma, endometrioid type Endometrial biopsy showed adenocarcinoma    04/23/2018 Tumor Marker    Patient's tumor was tested for the following markers: CA-125 Results of the tumor marker test revealed 73.2    04/24/2018 Surgery    Preoperative Diagnosis: At least Stage 3 Endometrial Cancer with metastases to right ovary   Postoperative  Diagnosis:  At least Stage 3, possible Stage 4 Endometrial Cancer with metastases to right ovary and questionable parametrial and sigmoid peritoneum  Procedure(s) Performed: Pelvic washings, Lysis of adhesions. Biopsy of left parametrial tissue and sigmoid peritoneum  Surgeon: Bernita Raisin, MD  Specimens: pelvic washings, parametrial biopsy, sigmoid peritoneum biopsy  Complications: none  Indication for Procedure:  Patient found at OSH to have right ovarian mass that was resected and found on permanent section to be c/w endometrial cancer. Also had endometrial biopsy showing the same.  Operative Findings: Significant adhesive disease almost miliary-like. The sigmoid was adherent to the left cornua. After takedown of the sigmoid in this region it was clear the left cornua was involved in an abnormal process including necrotic tissue clinically concerning for malignancy. In addition the bladder was adherent to this region. Posterior surface of the broad ligament in area c/w the parametria on the left showed likely tumor breakthrough. Images were taken. Biopsy from the tissue extruding from the left side of the uterus in the presumed parametrium was sent for frozen. This returned worrisome for disease, but not definitive. A separate area of the sigmoid was adherent inferior to the posterior surface lesion and the remnant of this adhesion was sent as sigmoid peritoneum. This, on frozen, returned with fibrosis.     04/24/2018 Pathology Results    1. Peritoneum, biopsy - METASTATIC CARCINOMA CONSISTENT WITH PATIENT'S CLINICAL HISTORY OF PRIMARY ENDOMETRIAL CARCINOMA. SEE  NOTE 2. Soft tissue, biopsy, parametrial - METASTATIC CARCINOMA CONSISTENT WITH PATIENT'S CLINICAL HISTORY OF PRIMARY ENDOMETRIAL CARCINOMA. SEE NOTE Diagnosis Note 1. and 2. Immunohistochemical stains show that the tumor cells are positive for PAX8 and negative for calretinin, consistent with the above diagnosis     04/25/2018 Cancer Staging    Staging form: Corpus Uteri - Carcinoma and Carcinosarcoma, AJCC 8th Edition - Clinical: Stage IVA (cT4, cN0, cM0) - Signed by Heath Lark, MD on 04/25/2018    05/02/2018 Tumor Marker    Patient's tumor was tested for the following markers: CA-125 Results of the tumor marker test revealed 56.7    05/04/2018 -  Chemotherapy    The patient had carboplatin and Taxol    05/08/2018 Procedure    Placement of a power injectable Port-A-Cath    05/29/2018 Tumor Marker    Patient's tumor was tested for the following markers: CA-125 Results of the tumor marker test revealed 31.6    06/19/2018 Tumor Marker    Patient's tumor was tested for the following markers: CA-125 Results of the tumor marker test revealed 21.1    06/28/2018 Imaging    No evidence of malignancy or other acute findings within the abdomen or pelvis.  Moderate hepatic steatosis.    07/10/2018 Tumor Marker    Patient's tumor was tested for the following markers: CA-125 Results of the tumor marker test revealed 12.6     Measurement of disease: CA125 potentially and clinical exam, likely will plan imaging for some portion of surveillance . Preop (however after RSO) 04/23/18 = 73.2 . 05/02/18 = 56.7 (prechemo)  Radiology: Mr Pelvis W Wo Contrast  Result Date: 09/04/2018 CLINICAL DATA:  Endometrial cancer EXAM: MRI PELVIS WITHOUT AND WITH CONTRAST TECHNIQUE: Multiplanar multisequence MR imaging of the pelvis was performed both before and after administration of intravenous contrast. CONTRAST:  10 mL Gadovist IV COMPARISON:  CT abdomen/pelvis dated 06/27/2018. Pelvic ultrasound dated 04/07/2018. FINDINGS: Urinary Tract:  Bladder is thick-walled although underdistended. Bowel: Visualized bowel is unremarkable, noting a normal appendix (series 5/image 6). Vascular/Lymphatic: No evidence of aneurysm. No pelvic lymphadenopathy. Reproductive: 2.0 x 2.2 endometrial mass, measuring 7 mm in thickness, previously 2.4  cm in thickness on prior ultrasound. This corresponds to the patient's known endometrial cancer. While some myometrial invasion cannot be excluded, it does not currently appear to include more than 1/2 of the myometrial thickness (series 1102/image 45). Status post right oophorectomy. Left kidney is notable for a dominant 16 mm cyst/follicle, likely physiologic. No enhancing ovarian mass. Other:  No pelvic ascites. No gross peritoneal nodularity. Musculoskeletal: No focal osseous lesions. IMPRESSION: Improving endometrial mass, now measuring 7 mm in thickness, corresponding to the patient's known endometrial cancer. Status post right oophorectomy. No pelvic ascites or gross peritoneal nodularity. Electronically Signed   By: Julian Hy M.D.   On: 09/04/2018 08:30  09/04/18 MRI pelvis - as above . 04/07/18 - CT AP and TVUS as noted in HPI  Outpatient Encounter Medications as of 09/11/2018  Medication Sig  . amLODipine (NORVASC) 5 MG tablet Take 5 mg by mouth at bedtime.   Marland Kitchen dexamethasone (DECADRON) 4 MG tablet Take 3 tabs at the night before and 3 tab the morning of chemotherapy, every 3 weeks, by mouth (Patient not taking: Reported on 09/11/2018)  . empagliflozin (JARDIANCE) 10 MG TABS tablet Take 10 mg by mouth daily.   . insulin degludec (TRESIBA FLEXTOUCH) 100 UNIT/ML SOPN FlexTouch Pen Inject 33 Units into the skin daily.   Marland Kitchen lidocaine-prilocaine (  EMLA) cream Apply 1 application topically as needed.  Marland Kitchen lisinopril (PRINIVIL,ZESTRIL) 20 MG tablet Take 20 mg by mouth at bedtime.   . metFORMIN (GLUCOPHAGE) 500 MG tablet Take 1,000 mg by mouth 2 (two) times daily with a meal.   . ondansetron (ZOFRAN) 8 MG tablet Take 1 tablet (8 mg total) by mouth every 8 (eight) hours as needed for nausea.  . prochlorperazine (COMPAZINE) 10 MG tablet TAKE 1 TABLET BY MOUTH EVERY 6 HOURS AS NEEDED FOR NAUSEA FOR VOMITING (Patient taking differently: Take 10 mg by mouth every 6 (six) hours as needed for nausea or  vomiting. TAKE 1 TABLET BY MOUTH EVERY 6 HOURS AS NEEDED FOR NAUSEA FOR VOMITING)  . [DISCONTINUED] amoxicillin (AMOXIL) 500 MG tablet Take 1 tablet (500 mg total) by mouth 2 (two) times daily.   No facility-administered encounter medications on file as of 09/11/2018.    Allergies  Allergen Reactions  . Latex Hives and Rash    Per DR. Gerarda Fraction. Question if this is a latex allergy versus Dermabond   . Other Rash    Dermabond    Past Medical History:  Diagnosis Date  . Arthritis   . Asthma   . 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 GERD (gastroesophageal reflux disease)   . Hypertension   . PONV (postoperative nausea and vomiting)    Past Surgical History:  Procedure Laterality Date  . IR IMAGING GUIDED PORT INSERTION  05/08/2018  . LAPAROSCOPIC SALPINGOOPHERECTOMY Right 2019   torsion  . ROBOTIC ASSISTED TOTAL HYSTERECTOMY WITH BILATERAL SALPINGO OOPHERECTOMY N/A 04/24/2018   Procedure: XI ROBOTIC  DIAGNOSTIC LAPAROSCOPY WITH BIOPSY.;  Surgeon: [name], MD;  Location: [facility];  Service: Gynecology;  Laterality: N/A;        Past Gynecological History:   GYNECOLOGIC HISTORY:  . No LMP recorded. (Menstrual status: Chemotherapy).  . Menarche: 38 years old . P 0 . Contraceptive none . HRT N/A  . Last Pap sure Family Hx:  Family History  Problem Relation Age of Onset  . Diabetes Mother   . Cervical cancer Mother   . Throat cancer Mother 40  . Heart attack Father   . Diabetes Brother   . Ovarian cancer Maternal Aunt 21  . Uterine cancer Maternal Aunt 21  . Breast cancer Maternal Aunt 21  . Epilepsy Paternal Aunt   . Kidney cancer Brother 8  . Ovarian cancer Cousin 60   Social Hx:  Marland Kitchen Tobacco use: None . Alcohol use: None . Illicit Drug use: None . Illicit IV Drug use: Never    Review of Systems: Review of Systems  Constitutional: Negative.   HENT:  Negative.   Eyes: Negative.   Respiratory: Negative.   Cardiovascular: Negative.    Gastrointestinal: Negative.   Endocrine: Negative.   Genitourinary: Negative.    Musculoskeletal: Negative.   Skin: Negative.   Neurological: Negative.   Hematological: Negative.   Psychiatric/Behavioral: Negative.   All other systems reviewed and are negative.  Vitals: Blood pressure 121/87, pulse 98, temperature 98.7 F (37.1 C), temperature source Oral, resp. rate 20, height 5' 1.5" (1.562 m), weight 166 lb 12.8 oz (75.7 kg), SpO2 100 %. Vitals:   09/11/18 0929  Weight: 176 lb (79.8 kg)  Height: 5' 1.5" (1.562 m)    Vitals:   09/11/18 0929  BP: 123/82  Pulse: (!) 105  Resp: 18  Temp: 98 F (36.7 C)  SpO2: 100%   Body mass index is  32.72 kg/m.   Physical Exam:  General :  Obese. Well developed, 38 y.o., female in no apparent distress HEENT:  Normocephalic/atraumatic, symmetric, EOMI, eyelids normal Neck:   Supple, no masses.  Lymphatics:  No cervical/ submandibular/ supraclavicular/ infraclavicular/ inguinal adenopathy Respiratory:  Respirations unlabored, no use of accessory muscles CV:   Deferred Breast:  Deferred Musculoskeletal: No CVA tenderness, normal muscle strength. Abdomen:  Trocar sites are healed. Abdominal wall is soft, non-tender and nondistended. No evidence of hernia. No masses. Extremities:  No lymphedema, no erythema, non-tender. Skin:   Normal inspection Neuro/Psych:  No focal motor deficit, no abnormal mental status. Normal gait. Normal affect. Alert and oriented to person, place, and time  Genito Urinary: Vulva: Normal external female genitalia.  Bladder/urethra: Urethral meatus normal in size and location. No lesions or   masses, well supported bladder Speculum exam: Vagina: No lesion, no discharge, no bleeding. Cervix: Normal appearing, no lesions. Bimanual exam:  Uterus: mobile Due to habitus unable to delineate size  Adnexal region: No masses. Rectovaginal:  Good tone, no masses, no cul de sac nodularity, no parametrial involvement or  nodularity. Parametrium feel free but difficult with length of vaginal canal.      Assessment  Locally advanced (pelvic peritoneum) Endometrial cancer  Plan  1. We previously discussed referral to genetics today given her young age. She has seen them and test results showed no pathogenic mutations 2. Management discussion ? She has completed neoadjuvant chemotherapy. ? She is locally advanced based on the information we have thus I favor surgical resection. ? We may want to consider brachytherapy postop given the possible parametrial involvement and we have discussed this on prior visits. 3. Surgical discussion ? We reviewed the surgical sketch on her prior planned surgery along with the risks, benefits, and alternatives and she was given a copy of that for her record. ? Today we re-reviewed these risks verbally. In her case a higher risk for bowel/urinary tract injury based on the findings at my initial laparoscopic evaluation. ? Plan is for robotic hysterectomy/USO ("BSO") although her one adnexa already removed but depending on her anatomy I may resect the residual IP ligament back a bit since it did have disease. 4. RTC postop to review the pathology and check on any symptoms of concern.  Face to face time with patient was 15 minutes. Over 50% of this time was spent on counseling and coordination of care.  [name], MD  09/11/2018, 5:22 PM    Cc: Albertina Parr, MD (Referring Ob/Gyn) Monico Blitz, MD (PCP)

## 2018-09-11 NOTE — Progress Notes (Signed)
[place] at [facility]   Progress Note: Established Patient Follow-Up   Consult was originally requested by Dr. Rubbie Battiest for an adnexal mass that was resected and found to be consistent with endometrioid adenocarcinoma.  In addition an endometrial biopsy showing the same.  Chief Complaint  Patient presents with  . Endometrial ca [facility])   GYN Oncologic Summary 1. 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 Possible parametrial involvement  05/04/18 - 08/28/18 Carbo/Taxol x 6 cycles (had one week hold C6 due to thrombo)  Surgery planned 2. Genetics with [facility] - no pathogenic mutations.  HPI: [name]  is a very nice 38 y.o.  P0   Interval History  She has treatment related alopecia. No complaints. Denies bleeding, denies pain.  She is here to review her MRI and discuss next steps in management. Since her last visit she completed her chemo   Oncologic Course She was in her usual state of health until the evening of 04/06/18 at which time she woke up with significant right lower quadrant pain.  She presented to the emergency room after the pain woke her up CT imaging was performed 04/07/2018 [facility] rocking him.  A 6.5 x 7 x 8.1 complex right adnexal mass was found with solid and fat components consistent with a teratoma.  No lymphadenopathy.  Small to moderate ascites and inflammatory changes were seen in the pelvis follow-up ultrasound was also performed showing concern for right ovarian torsion with a large ovarian mass cystic and solid components 9.7 cm.  A left ovarian cyst seen 3.4 cm.  Endometrial thickening with hypervascularity and endometrial thickness of 24 mm moderate pelvic free fluid.  Given the concern for torsion she was taken urgently to the operating room by Dr. Orson Ape. McLeod 04/07/2018.  At that time  operative laparoscopy with right salpingooophorectomy and findings consistent with torsion was performed.  In addition endometrial biopsy was performed.  Unfortunately final pathology reveals in the right ovary and endometrioid type adenocarcinoma the endometrial biopsy showed endometrioid type adenocarcinoma.  Comments include a favoring of endometrium as the primary.  The patient is therefore referred for recommendations and probable management.  She denied nausea , vomiting, denied recent bloating or pain denied any changes in her bowel habits or urinary habits.  She denies excess weight loss and she states she has a normal appetite.  She does admit to a long history of abnormal menses.  She states that she started her menstrual cycle at age 36.  There is documentation on the chart of possible PCOS.  Periods are described as both irregular and heavy with episodes intermittently of amenorrhea.  I took her to surgery 04/24/18 planning to do a completion surgery however she had left cornual/serosal disease fusing to the sigmoid and possibly emanating from her left parametrium. Intraoperative findings as follows: Significant adhesive disease almost miliary-like. The sigmoid was adherent to the left cornua. After takedown of the sigmoid in this region it was clear the left cornua was involved in an abnormal process including necrotic tissue clinically concerning for malignancy. In addition the bladder was adherent to this region. Posterior surface of the broad ligament in area c/w the parametria on the left showed likely tumor breakthrough. Images were taken. Biopsy from the tissue extruding from the left side of the uterus in the presumed parametrium was sent for frozen. This returned worrisome for  disease, but not definitive. A separate area of the sigmoid was adherent inferior to the posterior surface lesion and the remnant of this adhesion was sent as sigmoid peritoneum. Tissue biopsies as  follows: Peritoneum, biopsy (Sigmoid peritoneum) - METASTATIC CARCINOMA CONSISTENT WITH PATIENT'S CLINICAL HISTORY OF PRIMARY ENDOMETRIAL CARCINOMA. SEE NOTE 2. Soft tissue, biopsy, parametrial - METASTATIC CARCINOMA CONSISTENT WITH PATIENT'S CLINICAL HISTORY OF PRIMARY ENDOMETRIAL CARCINOMA. SEE NOTE  She started chemotherapy with Carboplatin/Taxol 05/04/18   Imported EPIC Oncologic History:  Oncology History   Genetics neg     Endometrial cancer ([place])   04/06/2018 Initial Diagnosis    She presented with severe abdominal pain    04/07/2018 Imaging    She presented to the emergency room after the pain woke her up CT imaging was performed 04/07/2018 [facility].  A 6.5 x 7 x 8.1 complex right adnexal mass was found with solid and fat components consistent with a teratoma.  No lymphadenopathy.  Small to moderate ascites and inflammatory changes were seen in the pelvis follow-up ultrasound was also performed showing concern for right ovarian torsion with a large ovarian mass cystic and solid components 9.7 cm.  A left ovarian cyst seen 3.4 cm.  Endometrial thickening with hypervascularity and endometrial thickness of 24 mm moderate pelvic free fluid.    04/07/2018 Surgery    Given the concern for torsion she was taken urgently to the operating room by Dr. Orson Ape. McLeod 04/07/2018.  At that time operative laparoscopy, torsion of the right adnexa was noted along with thickened abnormal endometrium.  She had right salpingo-oophorectomy through a laparoscopic approach along with endometrial biopsy.      04/07/2018 Pathology Results    Right ovary showed adenocarcinoma, endometrioid type Endometrial biopsy showed adenocarcinoma    04/23/2018 Tumor Marker    Patient's tumor was tested for the following markers: CA-125 Results of the tumor marker test revealed 73.2    04/24/2018 Surgery    Preoperative Diagnosis: At least Stage 3 Endometrial Cancer with metastases to right ovary   Postoperative  Diagnosis:  At least Stage 3, possible Stage 4 Endometrial Cancer with metastases to right ovary and questionable parametrial and sigmoid peritoneum  Procedure(s) Performed: Pelvic washings, Lysis of adhesions. Biopsy of left parametrial tissue and sigmoid peritoneum  Surgeon: Bernita Raisin, MD  Specimens: pelvic washings, parametrial biopsy, sigmoid peritoneum biopsy  Complications: none  Indication for Procedure:  Patient found at OSH to have right ovarian mass that was resected and found on permanent section to be c/w endometrial cancer. Also had endometrial biopsy showing the same.  Operative Findings: Significant adhesive disease almost miliary-like. The sigmoid was adherent to the left cornua. After takedown of the sigmoid in this region it was clear the left cornua was involved in an abnormal process including necrotic tissue clinically concerning for malignancy. In addition the bladder was adherent to this region. Posterior surface of the broad ligament in area c/w the parametria on the left showed likely tumor breakthrough. Images were taken. Biopsy from the tissue extruding from the left side of the uterus in the presumed parametrium was sent for frozen. This returned worrisome for disease, but not definitive. A separate area of the sigmoid was adherent inferior to the posterior surface lesion and the remnant of this adhesion was sent as sigmoid peritoneum. This, on frozen, returned with fibrosis.     04/24/2018 Pathology Results    1. Peritoneum, biopsy - METASTATIC CARCINOMA CONSISTENT WITH PATIENT'S CLINICAL HISTORY OF PRIMARY ENDOMETRIAL CARCINOMA. SEE  NOTE 2. Soft tissue, biopsy, parametrial - METASTATIC CARCINOMA CONSISTENT WITH PATIENT'S CLINICAL HISTORY OF PRIMARY ENDOMETRIAL CARCINOMA. SEE NOTE Diagnosis Note 1. and 2. Immunohistochemical stains show that the tumor cells are positive for PAX8 and negative for calretinin, consistent with the above diagnosis     04/25/2018 Cancer Staging    Staging form: Corpus Uteri - Carcinoma and Carcinosarcoma, AJCC 8th Edition - Clinical: Stage IVA (cT4, cN0, cM0) - Signed by Heath Lark, MD on 04/25/2018    05/02/2018 Tumor Marker    Patient's tumor was tested for the following markers: CA-125 Results of the tumor marker test revealed 56.7    05/04/2018 -  Chemotherapy    The patient had carboplatin and Taxol    05/08/2018 Procedure    Placement of a power injectable Port-A-Cath    05/29/2018 Tumor Marker    Patient's tumor was tested for the following markers: CA-125 Results of the tumor marker test revealed 31.6    06/19/2018 Tumor Marker    Patient's tumor was tested for the following markers: CA-125 Results of the tumor marker test revealed 21.1    06/28/2018 Imaging    No evidence of malignancy or other acute findings within the abdomen or pelvis.  Moderate hepatic steatosis.    07/10/2018 Tumor Marker    Patient's tumor was tested for the following markers: CA-125 Results of the tumor marker test revealed 12.6     Measurement of disease: CA125 potentially and clinical exam, likely will plan imaging for some portion of surveillance . Preop (however after RSO) 04/23/18 = 73.2 . 05/02/18 = 56.7 (prechemo)  Radiology: Mr Pelvis W Wo Contrast  Result Date: 09/04/2018 CLINICAL DATA:  Endometrial cancer EXAM: MRI PELVIS WITHOUT AND WITH CONTRAST TECHNIQUE: Multiplanar multisequence MR imaging of the pelvis was performed both before and after administration of intravenous contrast. CONTRAST:  10 mL Gadovist IV COMPARISON:  CT abdomen/pelvis dated 06/27/2018. Pelvic ultrasound dated 04/07/2018. FINDINGS: Urinary Tract:  Bladder is thick-walled although underdistended. Bowel: Visualized bowel is unremarkable, noting a normal appendix (series 5/image 6). Vascular/Lymphatic: No evidence of aneurysm. No pelvic lymphadenopathy. Reproductive: 2.0 x 2.2 endometrial mass, measuring 7 mm in thickness, previously 2.4  cm in thickness on prior ultrasound. This corresponds to the patient's known endometrial cancer. While some myometrial invasion cannot be excluded, it does not currently appear to include more than 1/2 of the myometrial thickness (series 1102/image 45). Status post right oophorectomy. Left kidney is notable for a dominant 16 mm cyst/follicle, likely physiologic. No enhancing ovarian mass. Other:  No pelvic ascites. No gross peritoneal nodularity. Musculoskeletal: No focal osseous lesions. IMPRESSION: Improving endometrial mass, now measuring 7 mm in thickness, corresponding to the patient's known endometrial cancer. Status post right oophorectomy. No pelvic ascites or gross peritoneal nodularity. Electronically Signed   By: Julian Hy M.D.   On: 09/04/2018 08:30  09/04/18 MRI pelvis - as above . 04/07/18 - CT AP and TVUS as noted in HPI  Outpatient Encounter Medications as of 09/11/2018  Medication Sig  . amLODipine (NORVASC) 5 MG tablet Take 5 mg by mouth at bedtime.   Marland Kitchen dexamethasone (DECADRON) 4 MG tablet Take 3 tabs at the night before and 3 tab the morning of chemotherapy, every 3 weeks, by mouth (Patient not taking: Reported on 09/11/2018)  . empagliflozin (JARDIANCE) 10 MG TABS tablet Take 10 mg by mouth daily.   . insulin degludec (TRESIBA FLEXTOUCH) 100 UNIT/ML SOPN FlexTouch Pen Inject 33 Units into the skin daily.   Marland Kitchen lidocaine-prilocaine (  EMLA) cream Apply 1 application topically as needed.  Marland Kitchen lisinopril (PRINIVIL,ZESTRIL) 20 MG tablet Take 20 mg by mouth at bedtime.   . metFORMIN (GLUCOPHAGE) 500 MG tablet Take 1,000 mg by mouth 2 (two) times daily with a meal.   . ondansetron (ZOFRAN) 8 MG tablet Take 1 tablet (8 mg total) by mouth every 8 (eight) hours as needed for nausea.  . prochlorperazine (COMPAZINE) 10 MG tablet TAKE 1 TABLET BY MOUTH EVERY 6 HOURS AS NEEDED FOR NAUSEA FOR VOMITING (Patient taking differently: Take 10 mg by mouth every 6 (six) hours as needed for nausea or  vomiting. TAKE 1 TABLET BY MOUTH EVERY 6 HOURS AS NEEDED FOR NAUSEA FOR VOMITING)  . [DISCONTINUED] amoxicillin (AMOXIL) 500 MG tablet Take 1 tablet (500 mg total) by mouth 2 (two) times daily.   No facility-administered encounter medications on file as of 09/11/2018.    Allergies  Allergen Reactions  . Latex Hives and Rash    Per DR. Gerarda Fraction. Question if this is a latex allergy versus Dermabond   . Other Rash    Dermabond    Past Medical History:  Diagnosis Date  . Arthritis   . Asthma   . 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 GERD (gastroesophageal reflux disease)   . Hypertension   . PONV (postoperative nausea and vomiting)    Past Surgical History:  Procedure Laterality Date  . IR IMAGING GUIDED PORT INSERTION  05/08/2018  . LAPAROSCOPIC SALPINGOOPHERECTOMY Right 2019   torsion  . ROBOTIC ASSISTED TOTAL HYSTERECTOMY WITH BILATERAL SALPINGO OOPHERECTOMY N/A 04/24/2018   Procedure: XI ROBOTIC  DIAGNOSTIC LAPAROSCOPY WITH BIOPSY.;  Surgeon: [name], MD;  Location: [facility];  Service: Gynecology;  Laterality: N/A;        Past Gynecological History:   GYNECOLOGIC HISTORY:  . No LMP recorded. (Menstrual status: Chemotherapy).  . Menarche: 38 years old . P 0 . Contraceptive none . HRT N/A  . Last Pap sure Family Hx:  Family History  Problem Relation Age of Onset  . Diabetes Mother   . Cervical cancer Mother   . Throat cancer Mother 41  . Heart attack Father   . Diabetes Brother   . Ovarian cancer Maternal Aunt 21  . Uterine cancer Maternal Aunt 21  . Breast cancer Maternal Aunt 21  . Epilepsy Paternal Aunt   . Kidney cancer Brother 34  . Ovarian cancer Cousin 13   Social Hx:  Marland Kitchen Tobacco use: None . Alcohol use: None . Illicit Drug use: None . Illicit IV Drug use: Never    Review of Systems: Review of Systems  Constitutional: Negative.   HENT:  Negative.   Eyes: Negative.   Respiratory: Negative.   Cardiovascular: Negative.    Gastrointestinal: Negative.   Endocrine: Negative.   Genitourinary: Negative.    Musculoskeletal: Negative.   Skin: Negative.   Neurological: Negative.   Hematological: Negative.   Psychiatric/Behavioral: Negative.   All other systems reviewed and are negative.  Vitals: Blood pressure 121/87, pulse 98, temperature 98.7 F (37.1 C), temperature source Oral, resp. rate 20, height 5' 1.5" (1.562 m), weight 166 lb 12.8 oz (75.7 kg), SpO2 100 %. Vitals:   09/11/18 0929  Weight: 176 lb (79.8 kg)  Height: 5' 1.5" (1.562 m)    Vitals:   09/11/18 0929  BP: 123/82  Pulse: (!) 105  Resp: 18  Temp: 98 F (36.7 C)  SpO2: 100%   Body mass index is  32.72 kg/m.   Physical Exam:  General :  Obese. Well developed, 38 y.o., female in no apparent distress HEENT:  Normocephalic/atraumatic, symmetric, EOMI, eyelids normal Neck:   Supple, no masses.  Lymphatics:  No cervical/ submandibular/ supraclavicular/ infraclavicular/ inguinal adenopathy Respiratory:  Respirations unlabored, no use of accessory muscles CV:   Deferred Breast:  Deferred Musculoskeletal: No CVA tenderness, normal muscle strength. Abdomen:  Trocar sites are healed. Abdominal wall is soft, non-tender and nondistended. No evidence of hernia. No masses. Extremities:  No lymphedema, no erythema, non-tender. Skin:   Normal inspection Neuro/Psych:  No focal motor deficit, no abnormal mental status. Normal gait. Normal affect. Alert and oriented to person, place, and time  Genito Urinary: Vulva: Normal external female genitalia.  Bladder/urethra: Urethral meatus normal in size and location. No lesions or   masses, well supported bladder Speculum exam: Vagina: No lesion, no discharge, no bleeding. Cervix: Normal appearing, no lesions. Bimanual exam:  Uterus: mobile Due to habitus unable to delineate size  Adnexal region: No masses. Rectovaginal:  Good tone, no masses, no cul de sac nodularity, no parametrial involvement or  nodularity. Parametrium feel free but difficult with length of vaginal canal.      Assessment  Locally advanced (pelvic peritoneum) Endometrial cancer  Plan  1. We previously discussed referral to genetics today given her young age. She has seen them and test results showed no pathogenic mutations 2. Management discussion ? She has completed neoadjuvant chemotherapy. ? She is locally advanced based on the information we have thus I favor surgical resection. ? We may want to consider brachytherapy postop given the possible parametrial involvement and we have discussed this on prior visits. 3. Surgical discussion ? We reviewed the surgical sketch on her prior planned surgery along with the risks, benefits, and alternatives and she was given a copy of that for her record. ? Today we re-reviewed these risks verbally. In her case a higher risk for bowel/urinary tract injury based on the findings at my initial laparoscopic evaluation. ? Plan is for robotic hysterectomy/USO ("BSO") although her one adnexa already removed but depending on her anatomy I may resect the residual IP ligament back a bit since it did have disease. 4. RTC postop to review the pathology and check on any symptoms of concern.  Face to face time with patient was 15 minutes. Over 50% of this time was spent on counseling and coordination of care.  [name], MD  09/11/2018, 5:22 PM    Cc: Albertina Parr, MD (Referring Ob/Gyn) Monico Blitz, MD (PCP)

## 2018-09-13 NOTE — Progress Notes (Signed)
09/04/2018- noted in Epic- MR pelvis w/wo contrast  08/28/2018- noted in Epic- labs-CBC w/diff., CMP, CA 125  06/28/2018- noted in Epic-CT abd.pelvis w/contrast  04/23/2018- noted in Epic-EKG and CXR

## 2018-09-13 NOTE — Patient Instructions (Addendum)
Kathryn Stewart  09/13/2018   Your procedure is scheduled on: Thursday 09/20/2018  Report to [facility] Main  Entrance              Report to admitting at  [place] AM    Call this number if you have problems the morning of surgery [phone]       Eat a light diet the day before surgery.  Examples including soups, broths, toast, yogurt, mashed potatoes.  Things to avoid include carbonated beverages (fizzy beverages), raw fruits and raw vegetables, or beans.   If your bowels are filled with gas, your surgeon will have difficulty visualizing your pelvic organs which increases your surgical risks.    CLEAR LIQUID DIET   Foods Allowed                                                                     Foods Excluded  Coffee and tea, regular and decaf                             liquids that you cannot  Plain Jell-O in any flavor                                             see through such as: Fruit ices (not with fruit pulp)                                     milk, soups, orange juice  Iced Popsicles                                    All solid food Carbonated beverages, regular and diet                                    Cranberry, grape and apple juices Sports drinks like Gatorade Lightly seasoned clear broth or consume(fat free) Sugar, honey syrup  Sample Menu Breakfast                                Lunch                                     Supper Cranberry juice                    Beef broth                            Chicken broth Jell-O  Grape juice                           Apple juice Coffee or tea                        Jell-O                                      Popsicle                                                Coffee or tea                        Coffee or tea  _____________________________________________________________________  How to Manage Your Diabetes Before and After Surgery  Why is it important  to control my blood sugar before and after surgery? . Improving blood sugar levels before and after surgery helps healing and can limit problems. . A way of improving blood sugar control is eating a healthy diet by: o  Eating less sugar and carbohydrates o  Increasing activity/exercise o  Talking with your doctor about reaching your blood sugar goals . High blood sugars (greater than 180 mg/dL) can raise your risk of infections and slow your recovery, so you will need to focus on controlling your diabetes during the weeks before surgery. . Make sure that the doctor who takes care of your diabetes knows about your planned surgery including the date and location.  How do I manage my blood sugar before surgery? . Check your blood sugar at least 4 times a day, starting 2 days before surgery, to make sure that the level is not too high or low. o Check your blood sugar the morning of your surgery when you wake up and every 2 hours until you get to the Short Stay unit. . If your blood sugar is less than 70 mg/dL, you will need to treat for low blood sugar: o Do not take insulin. o Treat a low blood sugar (less than 70 mg/dL) with  cup of clear juice (cranberry or apple), 4 glucose tablets, OR glucose gel. o Recheck blood sugar in 15 minutes after treatment (to make sure it is greater than 70 mg/dL). If your blood sugar is not greater than 70 mg/dL on recheck, call [phone] for further instructions. . Report your blood sugar to the short stay nurse when you get to Short Stay.  . If you are admitted to the hospital after surgery: o Your blood sugar will be checked by the staff and you will probably be given insulin after surgery (instead of oral diabetes medicines) to make sure you have good blood sugar levels. o The goal for blood sugar control after surgery is 80-180 mg/dL.   WHAT DO I DO ABOUT MY DIABETES MEDICATION?    The day before surgery, Take Metformin at lunch but Do Not Take The  Bedtime Dose!         The day before surgery, DO NOT TAKE Jardiance medication!  . Do not take oral diabetes medicines (pills) the morning of surgery.  . THE NIGHT BEFORE SURGERY, take    0  units of  [place]  insulin.       THE MORNING OF SURGERY, take     15  units of    [place]  Insulin  ___________________________________________________    Remember: Do not eat food or drink liquids :After Midnight.              BRUSH YOUR TEETH MORNING OF SURGERY AND RINSE YOUR MOUTH OUT, NO CHEWING GUM CANDY OR MINTS.     Take these medicines the morning of surgery with A SIP OF WATER:none   DO NOT TAKE ANY DIABETIC MEDICATIONS DAY OF YOUR SURGERY!                               You may not have any metal on your body including hair pins and              piercings  Do not wear jewelry, make-up, lotions, powders or perfumes, deodorant             Do not wear nail polish.  Do not shave  48 hours prior to surgery.               Do not bring valuables to the hospital. [place].  Contacts, dentures or bridgework may not be worn into surgery.  Leave suitcase in the car. After surgery it may be brought to your room.                  Please read over the following fact sheets you were given: _____________________________________________________________________             [facility] - Preparing for Surgery Before surgery, you can play an important role.  Because skin is not sterile, your skin needs to be as free of germs as possible.  You can reduce the number of germs on your skin by washing with CHG (chlorahexidine gluconate) soap before surgery.  CHG is an antiseptic cleaner which kills germs and bonds with the skin to continue killing germs even after washing. Please DO NOT use if you have an allergy to CHG or antibacterial soaps.  If your skin becomes reddened/irritated stop using the CHG and inform your nurse when you arrive at Short  Stay. Do not shave (including legs and underarms) for at least 48 hours prior to the first CHG shower.  You may shave your face/neck. Please follow these instructions carefully:  1.  Shower with CHG Soap the night before surgery and the  morning of Surgery.  2.  If you choose to wash your hair, wash your hair first as usual with your  normal  shampoo.  3.  After you shampoo, rinse your hair and body thoroughly to remove the  shampoo.                           4.  Use CHG as you would any other liquid soap.  You can apply chg directly  to the skin and wash                       Gently with a scrungie or clean washcloth.  5.  Apply the CHG Soap to your body ONLY FROM THE NECK DOWN.   Do not use on face/ open  Wound or open sores. Avoid contact with eyes, ears mouth and genitals (private parts).                       Wash face,  Genitals (private parts) with your normal soap.             6.  Wash thoroughly, paying special attention to the area where your surgery  will be performed.  7.  Thoroughly rinse your body with warm water from the neck down.  8.  DO NOT shower/wash with your normal soap after using and rinsing off  the CHG Soap.                9.  Pat yourself dry with a clean towel.            10.  Wear clean pajamas.            11.  Place clean sheets on your bed the night of your first shower and do not  sleep with pets. Day of Surgery : Do not apply any lotions/deodorants the morning of surgery.  Please wear clean clothes to the hospital/surgery center.  FAILURE TO FOLLOW THESE INSTRUCTIONS MAY RESULT IN THE CANCELLATION OF YOUR SURGERY PATIENT SIGNATURE_________________________________  NURSE SIGNATURE__________________________________  ________________________________________________________________________   Adam Phenix  An incentive spirometer is a tool that can help keep your lungs clear and active. This tool measures how well you are  filling your lungs with each breath. Taking long deep breaths may help reverse or decrease the chance of developing breathing (pulmonary) problems (especially infection) following:  A long period of time when you are unable to move or be active. BEFORE THE PROCEDURE   If the spirometer includes an indicator to show your best effort, your nurse or respiratory therapist will set it to a desired goal.  If possible, sit up straight or lean slightly forward. Try not to slouch.  Hold the incentive spirometer in an upright position. INSTRUCTIONS FOR USE  1. Sit on the edge of your bed if possible, or sit up as far as you can in bed or on a chair. 2. Hold the incentive spirometer in an upright position. 3. Breathe out normally. 4. Place the mouthpiece in your mouth and seal your lips tightly around it. 5. Breathe in slowly and as deeply as possible, raising the piston or the ball toward the top of the column. 6. Hold your breath for 3-5 seconds or for as long as possible. Allow the piston or ball to fall to the bottom of the column. 7. Remove the mouthpiece from your mouth and breathe out normally. 8. Rest for a few seconds and repeat Steps 1 through 7 at least 10 times every 1-2 hours when you are awake. Take your time and take a few normal breaths between deep breaths. 9. The spirometer may include an indicator to show your best effort. Use the indicator as a goal to work toward during each repetition. 10. After each set of 10 deep breaths, practice coughing to be sure your lungs are clear. If you have an incision (the cut made at the time of surgery), support your incision when coughing by placing a pillow or rolled up towels firmly against it. Once you are able to get out of bed, walk around indoors and cough well. You may stop using the incentive spirometer when instructed by your caregiver.  RISKS AND COMPLICATIONS  Take your time so you do not get  dizzy or light-headed.  If you are in pain,  you may need to take or ask for pain medication before doing incentive spirometry. It is harder to take a deep breath if you are having pain. AFTER USE  Rest and breathe slowly and easily.  It can be helpful to keep track of a log of your progress. Your caregiver can provide you with a simple table to help with this. If you are using the spirometer at home, follow these instructions: [place] IF:   You are having difficultly using the spirometer.  You have trouble using the spirometer as often as instructed.  Your pain medication is not giving enough relief while using the spirometer.  You develop fever of 100.5 F (38.1 C) or higher. SEEK IMMEDIATE MEDICAL CARE IF:   You cough up bloody sputum that had not been present before.  You develop fever of 102 F (38.9 C) or greater.  You develop worsening pain at or near the incision site. MAKE SURE YOU:   Understand these instructions.  Will watch your condition.  Will get help right away if you are not doing well or get worse. Document Released: 01/02/2007 Document Revised: 11/14/2011 Document Reviewed: 03/05/2007 ExitCare Patient Information 2014 ExitCare, [place].   ________________________________________________________________________  WHAT IS A BLOOD TRANSFUSION? Blood Transfusion Information  A transfusion is the replacement of blood or some of its parts. Blood is made up of multiple cells which provide different functions.  Red blood cells carry oxygen and are used for blood loss replacement.  White blood cells fight against infection.  Platelets control bleeding.  Plasma helps clot blood.  Other blood products are available for specialized needs, such as hemophilia or other clotting disorders. BEFORE THE TRANSFUSION  Who gives blood for transfusions?   Healthy volunteers who are fully evaluated to make sure their blood is safe. This is blood bank blood. Transfusion therapy is the safest it has ever  been in the practice of medicine. Before blood is taken from a donor, a complete history is taken to make sure that person has no history of diseases nor engages in risky social behavior (examples are intravenous drug use or sexual activity with multiple partners). The donor's travel history is screened to minimize risk of transmitting infections, such as malaria. The donated blood is tested for signs of infectious diseases, such as HIV and hepatitis. The blood is then tested to be sure it is compatible with you in order to minimize the chance of a transfusion reaction. If you or a relative donates blood, this is often done in anticipation of surgery and is not appropriate for emergency situations. It takes many days to process the donated blood. RISKS AND COMPLICATIONS Although transfusion therapy is very safe and saves many lives, the main dangers of transfusion include:   Getting an infectious disease.  Developing a transfusion reaction. This is an allergic reaction to something in the blood you were given. Every precaution is taken to prevent this. The decision to have a blood transfusion has been considered carefully by your caregiver before blood is given. Blood is not given unless the benefits outweigh the risks. AFTER THE TRANSFUSION  Right after receiving a blood transfusion, you will usually feel much better and more energetic. This is especially true if your red blood cells have gotten low (anemic). The transfusion raises the level of the red blood cells which carry oxygen, and this usually causes an energy increase.  The nurse administering the transfusion will  monitor you carefully for complications. HOME CARE INSTRUCTIONS  No special instructions are needed after a transfusion. You may find your energy is better. Speak with your caregiver about any limitations on activity for underlying diseases you may have. SEEK MEDICAL CARE IF:   Your condition is not improving after your  transfusion.  You develop redness or irritation at the intravenous (IV) site. SEEK IMMEDIATE MEDICAL CARE IF:  Any of the following symptoms occur over the next 12 hours:  Shaking chills.  You have a temperature by mouth above 102 F (38.9 C), not controlled by medicine.  Chest, back, or muscle pain.  People around you feel you are not acting correctly or are confused.  Shortness of breath or difficulty breathing.  Dizziness and fainting.  You get a rash or develop hives.  You have a decrease in urine output.  Your urine turns a dark color or changes to pink, red, or brown. Any of the following symptoms occur over the next 10 days:  You have a temperature by mouth above 102 F (38.9 C), not controlled by medicine.  Shortness of breath.  Weakness after normal activity.  The white part of the eye turns yellow (jaundice).  You have a decrease in the amount of urine or are urinating less often.  Your urine turns a dark color or changes to pink, red, or brown. Document Released: 08/19/2000 Document Revised: 11/14/2011 Document Reviewed: 04/07/2008 [facility] Patient Information 2014 [place], [place].  _______________________________________________________________________

## 2018-09-17 ENCOUNTER — Other Ambulatory Visit: Payer: Self-pay

## 2018-09-17 ENCOUNTER — Encounter (HOSPITAL_COMMUNITY): Payer: Self-pay

## 2018-09-17 ENCOUNTER — Encounter (HOSPITAL_COMMUNITY)
Admission: RE | Admit: 2018-09-17 | Discharge: 2018-09-17 | Disposition: A | Discharge status: Home / Self Care | Payer: BLUE CROSS/BLUE SHIELD | Source: Ambulatory Visit | Attending: Obstetrics | Admitting: Obstetrics

## 2018-09-17 DIAGNOSIS — Z01812 Encounter for preprocedural laboratory examination: Secondary | ICD-10-CM | POA: Diagnosis present

## 2018-09-17 LAB — COMPREHENSIVE METABOLIC PANEL
ALT: 29 U/L (ref 0–44)
AST: 23 U/L (ref 15–41)
Albumin: 4.1 g/dL (ref 3.5–5.0)
Alkaline Phosphatase: 46 U/L (ref 38–126)
Anion gap: 11 (ref 5–15)
BUN: 17 mg/dL (ref 6–20)
CHLORIDE: 106 mmol/L (ref 98–111)
CO2: 26 mmol/L (ref 22–32)
Calcium: 9.8 mg/dL (ref 8.9–10.3)
Creatinine, Ser: 0.68 mg/dL (ref 0.44–1.00)
GFR calc Af Amer: >60 mL/min (ref >60)
GFR calc non Af Amer: >60 mL/min (ref >60)
Glucose, Bld: 151 mg/dL — ABNORMAL HIGH (ref 70–99)
POTASSIUM: 4.6 mmol/L (ref 3.5–5.1)
Sodium: 143 mmol/L (ref 135–145)
Total Bilirubin: 0.7 mg/dL (ref 0.3–1.2)
Total Protein: 7.3 g/dL (ref 6.5–8.1)

## 2018-09-17 LAB — URINALYSIS, ROUTINE W REFLEX MICROSCOPIC
Bacteria, UA: NONE SEEN
Bilirubin Urine: NEGATIVE
Glucose, UA: >=500 mg/dL — AB
Hgb urine dipstick: NEGATIVE
Ketones, ur: 5 mg/dL — AB
Leukocytes, UA: NEGATIVE
Nitrite: NEGATIVE
PH: 5 (ref 5.0–8.0)
Protein, ur: NEGATIVE mg/dL
Specific Gravity, Urine: 1.027 (ref 1.005–1.030)

## 2018-09-17 LAB — CBC
HCT: 25.5 % — ABNORMAL LOW (ref 36.0–46.0)
Hemoglobin: 8.2 g/dL — ABNORMAL LOW (ref 12.0–15.0)
MCH: 34.6 pg — ABNORMAL HIGH (ref 26.0–34.0)
MCHC: 32.2 g/dL (ref 30.0–36.0)
MCV: 107.6 fL — ABNORMAL HIGH (ref 80.0–100.0)
PLATELETS: 100 10*3/uL — AB (ref 150–400)
RBC: 2.37 MIL/uL — ABNORMAL LOW (ref 3.87–5.11)
RDW: 19.2 % — ABNORMAL HIGH (ref 11.5–15.5)
WBC: 3.5 10*3/uL — ABNORMAL LOW (ref 4.0–10.5)
nRBC: 3.2 % — ABNORMAL HIGH (ref 0.0–0.2)

## 2018-09-17 LAB — GLUCOSE, CAPILLARY: Glucose-Capillary: 132 mg/dL — ABNORMAL HIGH (ref 70–99)

## 2018-09-17 LAB — PREGNANCY, URINE: Preg Test, Ur: NEGATIVE

## 2018-09-17 LAB — HEMOGLOBIN A1C
Hgb A1c MFr Bld: 7.8 % — ABNORMAL HIGH (ref 4.8–5.6)
Mean Plasma Glucose: 177.16 mg/dL

## 2018-09-17 NOTE — Anesthesia Preprocedure Evaluation (Addendum)
Anesthesia Evaluation  Patient identified by MRN, date of birth, ID band Patient awake    Reviewed: Allergy & Precautions, NPO status , Patient's Chart, lab work & pertinent test results  History of Anesthesia Complications (+) PONV and history of anesthetic complications  Airway Mallampati: II  TM Distance: >3 FB Neck ROM: Full    Dental no notable dental hx. (+) Teeth Intact, Caps   Pulmonary asthma ,    Pulmonary exam normal        Cardiovascular hypertension, Pt. on medications Normal cardiovascular exam Rhythm:Regular Rate:Normal     Neuro/Psych Chemo induced peripheral neuropathy  Neuromuscular disease negative psych ROS   GI/Hepatic Neg liver ROS, GERD  Medicated and Controlled,  Endo/Other  diabetes, Well Controlled, Type 2, Insulin Dependent, Oral Hypoglycemic AgentsObesity  Renal/GU negative Renal ROS  negative genitourinary   Musculoskeletal  (+) Arthritis , Osteoarthritis,    Abdominal (+) + obese,   Peds  Hematology  (+) anemia , Chemotherapy induced thrombocytopenia   Anesthesia Other Findings   Reproductive/Obstetrics Uterine Ca                          Anesthesia Physical Anesthesia Plan  ASA: III  Anesthesia Plan: General   Post-op Pain Management:    Induction:   PONV Risk Score and Plan: 4 or greater and Scopolamine patch - Pre-op, Ondansetron, Dexamethasone, Treatment may vary due to age or medical condition and Midazolam  Airway Management Planned: Oral ETT  Additional Equipment:   Intra-op Plan:   Post-operative Plan: Extubation in OR  Informed Consent: I have reviewed the patients History and Physical, chart, labs and discussed the procedure including the risks, benefits and alternatives for the proposed anesthesia with the patient or authorized representative who has indicated his/her understanding and acceptance.     Dental advisory given  Plan  Discussed with: CRNA and Surgeon  Anesthesia Plan Comments: (Hemoglobin 8.2 at PST visit 09/17/18.  Dr. Gerarda Fraction made aware, type and screen ordered.  Will repeat CBC DOS. Konrad Felix, PA-C )       Anesthesia Quick Evaluation

## 2018-09-20 ENCOUNTER — Ambulatory Visit (HOSPITAL_COMMUNITY): Payer: BLUE CROSS/BLUE SHIELD | Admitting: Physician Assistant

## 2018-09-20 ENCOUNTER — Encounter (HOSPITAL_COMMUNITY)
Admission: RE | Disposition: A | Discharge status: Home / Self Care | Payer: Self-pay | Source: Home / Self Care | Attending: Obstetrics

## 2018-09-20 ENCOUNTER — Encounter (HOSPITAL_COMMUNITY): Payer: Self-pay

## 2018-09-20 ENCOUNTER — Ambulatory Visit (HOSPITAL_COMMUNITY)
Admission: RE | Admit: 2018-09-20 | Discharge: 2018-09-21 | Disposition: A | Discharge status: Home / Self Care | Payer: BLUE CROSS/BLUE SHIELD | Attending: Obstetrics | Admitting: Obstetrics

## 2018-09-20 ENCOUNTER — Other Ambulatory Visit: Payer: Self-pay | Admitting: Gynecologic Oncology

## 2018-09-20 DIAGNOSIS — C7962 Secondary malignant neoplasm of left ovary: Secondary | ICD-10-CM | POA: Diagnosis not present

## 2018-09-20 DIAGNOSIS — C55 Malignant neoplasm of uterus, part unspecified: Secondary | ICD-10-CM | POA: Diagnosis present

## 2018-09-20 DIAGNOSIS — D696 Thrombocytopenia, unspecified: Secondary | ICD-10-CM

## 2018-09-20 DIAGNOSIS — T451X5A Adverse effect of antineoplastic and immunosuppressive drugs, initial encounter: Secondary | ICD-10-CM | POA: Diagnosis not present

## 2018-09-20 DIAGNOSIS — Z9221 Personal history of antineoplastic chemotherapy: Secondary | ICD-10-CM | POA: Insufficient documentation

## 2018-09-20 DIAGNOSIS — K219 Gastro-esophageal reflux disease without esophagitis: Secondary | ICD-10-CM | POA: Insufficient documentation

## 2018-09-20 DIAGNOSIS — C541 Malignant neoplasm of endometrium: Secondary | ICD-10-CM

## 2018-09-20 DIAGNOSIS — G620 Drug-induced polyneuropathy: Secondary | ICD-10-CM | POA: Diagnosis not present

## 2018-09-20 DIAGNOSIS — Z90721 Acquired absence of ovaries, unilateral: Secondary | ICD-10-CM | POA: Insufficient documentation

## 2018-09-20 DIAGNOSIS — D6959 Other secondary thrombocytopenia: Secondary | ICD-10-CM | POA: Diagnosis not present

## 2018-09-20 DIAGNOSIS — N736 Female pelvic peritoneal adhesions (postinfective): Secondary | ICD-10-CM | POA: Diagnosis not present

## 2018-09-20 DIAGNOSIS — C786 Secondary malignant neoplasm of retroperitoneum and peritoneum: Secondary | ICD-10-CM | POA: Insufficient documentation

## 2018-09-20 DIAGNOSIS — Z9079 Acquired absence of other genital organ(s): Secondary | ICD-10-CM | POA: Insufficient documentation

## 2018-09-20 DIAGNOSIS — D630 Anemia in neoplastic disease: Secondary | ICD-10-CM | POA: Diagnosis not present

## 2018-09-20 DIAGNOSIS — C539 Malignant neoplasm of cervix uteri, unspecified: Secondary | ICD-10-CM | POA: Diagnosis not present

## 2018-09-20 DIAGNOSIS — D259 Leiomyoma of uterus, unspecified: Secondary | ICD-10-CM | POA: Insufficient documentation

## 2018-09-20 DIAGNOSIS — Z794 Long term (current) use of insulin: Secondary | ICD-10-CM | POA: Diagnosis not present

## 2018-09-20 DIAGNOSIS — I10 Essential (primary) hypertension: Secondary | ICD-10-CM | POA: Insufficient documentation

## 2018-09-20 DIAGNOSIS — Z79899 Other long term (current) drug therapy: Secondary | ICD-10-CM | POA: Insufficient documentation

## 2018-09-20 DIAGNOSIS — E119 Type 2 diabetes mellitus without complications: Secondary | ICD-10-CM | POA: Insufficient documentation

## 2018-09-20 HISTORY — PX: ROBOTIC ASSISTED TOTAL HYSTERECTOMY WITH BILATERAL SALPINGO OOPHERECTOMY: SHX6086

## 2018-09-20 LAB — PREPARE RBC (CROSSMATCH)

## 2018-09-20 LAB — CBC WITH DIFFERENTIAL/PLATELET
Abs Immature Granulocytes: 0.11 10*3/uL — ABNORMAL HIGH (ref 0.00–0.07)
Basophils Absolute: 0 10*3/uL (ref 0.0–0.1)
Basophils Relative: 1 %
Eosinophils Absolute: 0 10*3/uL (ref 0.0–0.5)
Eosinophils Relative: 0 %
HCT: 24.4 % — ABNORMAL LOW (ref 36.0–46.0)
Hemoglobin: 7.7 g/dL — ABNORMAL LOW (ref 12.0–15.0)
IMMATURE GRANULOCYTES: 3 %
Lymphocytes Relative: 36 %
Lymphs Abs: 1.6 10*3/uL (ref 0.7–4.0)
MCH: 34.5 pg — ABNORMAL HIGH (ref 26.0–34.0)
MCHC: 31.6 g/dL (ref 30.0–36.0)
MCV: 109.4 fL — ABNORMAL HIGH (ref 80.0–100.0)
Monocytes Absolute: 0.5 10*3/uL (ref 0.1–1.0)
Monocytes Relative: 12 %
Neutro Abs: 2.2 10*3/uL (ref 1.7–7.7)
Neutrophils Relative %: 48 %
Platelets: 84 10*3/uL — ABNORMAL LOW (ref 150–400)
RBC: 2.23 MIL/uL — ABNORMAL LOW (ref 3.87–5.11)
RDW: 19.8 % — AB (ref 11.5–15.5)
WBC: 4.4 10*3/uL (ref 4.0–10.5)
nRBC: 2.5 % — ABNORMAL HIGH (ref 0.0–0.2)

## 2018-09-20 LAB — GLUCOSE, CAPILLARY
Glucose-Capillary: 158 mg/dL — ABNORMAL HIGH (ref 70–99)
Glucose-Capillary: 234 mg/dL — ABNORMAL HIGH (ref 70–99)
Glucose-Capillary: 259 mg/dL — ABNORMAL HIGH (ref 70–99)
Glucose-Capillary: 185 mg/dL — ABNORMAL HIGH (ref 70–99)
Glucose-Capillary: 203 mg/dL — ABNORMAL HIGH (ref 70–99)

## 2018-09-20 SURGERY — HYSTERECTOMY, TOTAL, ROBOT-ASSISTED, LAPAROSCOPIC, WITH BILATERAL SALPINGO-OOPHORECTOMY
Anesthesia: General

## 2018-09-20 MED ORDER — LIDOCAINE 2% (20 MG/ML) 5 ML SYRINGE
INTRAMUSCULAR | Status: AC
  Filled 2018-09-20: qty 5

## 2018-09-20 MED ORDER — ACETAMINOPHEN 500 MG PO TABS
1000.0000 mg | ORAL_TABLET | ORAL | Status: AC
  Administered 2018-09-20: 1000 mg via ORAL
  Filled 2018-09-20: qty 2

## 2018-09-20 MED ORDER — INSULIN ASPART 100 UNIT/ML ~~LOC~~ SOLN
3.0000 [IU] | Freq: Three times a day (TID) | SUBCUTANEOUS | Status: DC
  Administered 2018-09-21: 3 [IU] via SUBCUTANEOUS

## 2018-09-20 MED ORDER — MIDAZOLAM HCL 2 MG/2ML IJ SOLN
INTRAMUSCULAR | Status: AC
  Filled 2018-09-20: qty 2

## 2018-09-20 MED ORDER — INSULIN ASPART 100 UNIT/ML ~~LOC~~ SOLN
5.0000 [IU] | Freq: Once | SUBCUTANEOUS | Status: AC
  Administered 2018-09-20: 5 [IU] via SUBCUTANEOUS

## 2018-09-20 MED ORDER — PROPOFOL 10 MG/ML IV BOLUS
INTRAVENOUS | Status: AC
  Filled 2018-09-20: qty 20

## 2018-09-20 MED ORDER — SUGAMMADEX SODIUM 200 MG/2ML IV SOLN
INTRAVENOUS | Status: AC
  Filled 2018-09-20: qty 2

## 2018-09-20 MED ORDER — HYDROMORPHONE HCL 1 MG/ML IJ SOLN
INTRAMUSCULAR | Status: DC | PRN
  Administered 2018-09-20 (×2): 0.5 mg via INTRAVENOUS

## 2018-09-20 MED ORDER — SCOPOLAMINE 1 MG/3DAYS TD PT72
1.0000 | MEDICATED_PATCH | TRANSDERMAL | Status: DC
  Administered 2018-09-20: 1.5 mg via TRANSDERMAL
  Filled 2018-09-20: qty 1

## 2018-09-20 MED ORDER — SUFENTANIL CITRATE 50 MCG/ML IV SOLN
INTRAVENOUS | Status: AC
  Filled 2018-09-20: qty 1

## 2018-09-20 MED ORDER — ROCURONIUM BROMIDE 100 MG/10ML IV SOLN
INTRAVENOUS | Status: AC
  Filled 2018-09-20: qty 1

## 2018-09-20 MED ORDER — HYDROMORPHONE HCL 2 MG/ML IJ SOLN
INTRAMUSCULAR | Status: AC
  Filled 2018-09-20: qty 1

## 2018-09-20 MED ORDER — GABAPENTIN 300 MG PO CAPS
300.0000 mg | ORAL_CAPSULE | Freq: Three times a day (TID) | ORAL | Status: DC
  Administered 2018-09-20: 300 mg via ORAL
  Filled 2018-09-20: qty 1

## 2018-09-20 MED ORDER — CEFAZOLIN SODIUM-DEXTROSE 2-4 GM/100ML-% IV SOLN
2.0000 g | INTRAVENOUS | Status: AC
  Administered 2018-09-20: 2 g via INTRAVENOUS
  Filled 2018-09-20: qty 100

## 2018-09-20 MED ORDER — SODIUM CHLORIDE 0.9 % IV SOLN
INTRAVENOUS | Status: DC | PRN
  Administered 2018-09-20: 09:00:00 via INTRAVENOUS

## 2018-09-20 MED ORDER — SODIUM CHLORIDE (PF) 0.9 % IJ SOLN
INTRAMUSCULAR | Status: AC
  Filled 2018-09-20: qty 10

## 2018-09-20 MED ORDER — DOCUSATE SODIUM 100 MG PO CAPS
100.0000 mg | ORAL_CAPSULE | Freq: Two times a day (BID) | ORAL | Status: DC
  Administered 2018-09-20: 100 mg via ORAL
  Filled 2018-09-20: qty 1

## 2018-09-20 MED ORDER — DEXAMETHASONE SODIUM PHOSPHATE 10 MG/ML IJ SOLN
INTRAMUSCULAR | Status: AC
  Filled 2018-09-20: qty 1

## 2018-09-20 MED ORDER — KCL IN DEXTROSE-NACL 20-5-0.45 MEQ/L-%-% IV SOLN
INTRAVENOUS | Status: DC
  Administered 2018-09-20: 16:00:00 via INTRAVENOUS
  Filled 2018-09-20: qty 1000

## 2018-09-20 MED ORDER — DEXAMETHASONE SODIUM PHOSPHATE 10 MG/ML IJ SOLN
INTRAMUSCULAR | Status: DC | PRN
  Administered 2018-09-20: 10 mg via INTRAVENOUS

## 2018-09-20 MED ORDER — METOCLOPRAMIDE HCL 5 MG/ML IJ SOLN
INTRAMUSCULAR | Status: AC
  Filled 2018-09-20: qty 2

## 2018-09-20 MED ORDER — PHENYLEPHRINE 40 MCG/ML (10ML) SYRINGE FOR IV PUSH (FOR BLOOD PRESSURE SUPPORT)
PREFILLED_SYRINGE | INTRAVENOUS | Status: DC | PRN
  Administered 2018-09-20 (×2): 80 ug via INTRAVENOUS

## 2018-09-20 MED ORDER — SUFENTANIL CITRATE 50 MCG/ML IV SOLN
INTRAVENOUS | Status: DC | PRN
  Administered 2018-09-20: 10 ug via INTRAVENOUS
  Administered 2018-09-20: 20 ug via INTRAVENOUS
  Administered 2018-09-20 (×2): 10 ug via INTRAVENOUS

## 2018-09-20 MED ORDER — MEPERIDINE HCL 50 MG/ML IJ SOLN: 6.2500 mg | INTRAMUSCULAR | Status: DC | PRN

## 2018-09-20 MED ORDER — ONDANSETRON HCL 4 MG/2ML IJ SOLN
INTRAMUSCULAR | Status: AC
  Filled 2018-09-20: qty 4

## 2018-09-20 MED ORDER — PHENYLEPHRINE 40 MCG/ML (10ML) SYRINGE FOR IV PUSH (FOR BLOOD PRESSURE SUPPORT)
PREFILLED_SYRINGE | INTRAVENOUS | Status: AC
  Filled 2018-09-20: qty 10

## 2018-09-20 MED ORDER — MIDAZOLAM HCL 2 MG/2ML IJ SOLN
INTRAMUSCULAR | Status: DC | PRN
  Administered 2018-09-20: 2 mg via INTRAVENOUS

## 2018-09-20 MED ORDER — INSULIN ASPART 100 UNIT/ML ~~LOC~~ SOLN: 0.0000 [IU] | Freq: Three times a day (TID) | SUBCUTANEOUS | Status: DC

## 2018-09-20 MED ORDER — OXYCODONE HCL 5 MG PO TABS: 5.0000 mg | ORAL_TABLET | ORAL | Status: DC | PRN

## 2018-09-20 MED ORDER — INSULIN ASPART 100 UNIT/ML ~~LOC~~ SOLN
0.0000 [IU] | Freq: Every day | SUBCUTANEOUS | Status: DC
  Administered 2018-09-20: 2 [IU] via SUBCUTANEOUS

## 2018-09-20 MED ORDER — CELECOXIB 200 MG PO CAPS
400.0000 mg | ORAL_CAPSULE | ORAL | Status: AC
  Administered 2018-09-20: 400 mg via ORAL
  Filled 2018-09-20: qty 2

## 2018-09-20 MED ORDER — SUGAMMADEX SODIUM 200 MG/2ML IV SOLN
INTRAVENOUS | Status: DC | PRN
  Administered 2018-09-20: 160 mg via INTRAVENOUS

## 2018-09-20 MED ORDER — ONDANSETRON HCL 4 MG PO TABS: 4.0000 mg | ORAL_TABLET | Freq: Four times a day (QID) | ORAL | Status: DC | PRN

## 2018-09-20 MED ORDER — LACTATED RINGERS IV SOLN
INTRAVENOUS | Status: DC
  Administered 2018-09-20 (×2): via INTRAVENOUS

## 2018-09-20 MED ORDER — ACETAMINOPHEN 500 MG PO TABS
1000.0000 mg | ORAL_TABLET | Freq: Four times a day (QID) | ORAL | Status: DC
  Administered 2018-09-20 – 2018-09-21 (×2): 1000 mg via ORAL
  Filled 2018-09-20 (×2): qty 2

## 2018-09-20 MED ORDER — HYDROMORPHONE HCL 1 MG/ML IJ SOLN: 0.2500 mg | INTRAMUSCULAR | Status: DC | PRN

## 2018-09-20 MED ORDER — STERILE WATER FOR IRRIGATION IR SOLN
Status: DC | PRN
  Administered 2018-09-20: 1000 mL

## 2018-09-20 MED ORDER — INSULIN GLARGINE 100 UNIT/ML ~~LOC~~ SOLN
24.0000 [IU] | Freq: Every day | SUBCUTANEOUS | Status: DC
  Administered 2018-09-20: 24 [IU] via SUBCUTANEOUS
  Filled 2018-09-20: qty 0.24

## 2018-09-20 MED ORDER — LIDOCAINE 2% (20 MG/ML) 5 ML SYRINGE
INTRAMUSCULAR | Status: DC | PRN
  Administered 2018-09-20: 100 mg via INTRAVENOUS

## 2018-09-20 MED ORDER — AMLODIPINE BESYLATE 5 MG PO TABS
5.0000 mg | ORAL_TABLET | Freq: Every day | ORAL | Status: DC
  Administered 2018-09-20: 5 mg via ORAL
  Filled 2018-09-20: qty 1

## 2018-09-20 MED ORDER — METOCLOPRAMIDE HCL 5 MG/ML IJ SOLN
10.0000 mg | Freq: Once | INTRAMUSCULAR | Status: AC | PRN
  Administered 2018-09-20: 10 mg via INTRAVENOUS

## 2018-09-20 MED ORDER — PROPOFOL 10 MG/ML IV BOLUS
INTRAVENOUS | Status: DC | PRN
  Administered 2018-09-20: 110 mg via INTRAVENOUS

## 2018-09-20 MED ORDER — ONDANSETRON HCL 4 MG/2ML IJ SOLN
INTRAMUSCULAR | Status: DC | PRN
  Administered 2018-09-20 (×2): 4 mg via INTRAVENOUS

## 2018-09-20 MED ORDER — SODIUM CHLORIDE 0.9 % IR SOLN
Status: DC | PRN
  Administered 2018-09-20: 1000 mL

## 2018-09-20 MED ORDER — ENOXAPARIN SODIUM 40 MG/0.4ML ~~LOC~~ SOLN
40.0000 mg | SUBCUTANEOUS | Status: DC
  Administered 2018-09-21: 40 mg via SUBCUTANEOUS
  Filled 2018-09-20: qty 0.4

## 2018-09-20 MED ORDER — TRAMADOL HCL 50 MG PO TABS
100.0000 mg | ORAL_TABLET | Freq: Four times a day (QID) | ORAL | Status: DC | PRN
  Administered 2018-09-20: 100 mg via ORAL
  Filled 2018-09-20: qty 2

## 2018-09-20 MED ORDER — ONDANSETRON HCL 4 MG/2ML IJ SOLN: 4.0000 mg | Freq: Four times a day (QID) | INTRAMUSCULAR | Status: DC | PRN

## 2018-09-20 MED ORDER — DEXAMETHASONE SODIUM PHOSPHATE 4 MG/ML IJ SOLN: 4.0000 mg | INTRAMUSCULAR | Status: DC

## 2018-09-20 MED ORDER — LISINOPRIL 20 MG PO TABS
20.0000 mg | ORAL_TABLET | Freq: Every day | ORAL | Status: DC
  Administered 2018-09-20: 20 mg via ORAL
  Filled 2018-09-20: qty 1

## 2018-09-20 MED ORDER — ROCURONIUM BROMIDE 10 MG/ML (PF) SYRINGE
PREFILLED_SYRINGE | INTRAVENOUS | Status: DC | PRN
  Administered 2018-09-20: 20 mg via INTRAVENOUS
  Administered 2018-09-20: 50 mg via INTRAVENOUS
  Administered 2018-09-20: 20 mg via INTRAVENOUS

## 2018-09-20 SURGICAL SUPPLY — 75 items
APPLICATOR COTTON TIP 6 STRL (MISCELLANEOUS) ×1 IMPLANT
APPLICATOR COTTON TIP 6IN STRL (MISCELLANEOUS) ×3
APPLICATOR SURGIFLO ENDO (HEMOSTASIS) IMPLANT
BAG LAPAROSCOPIC 12 15 PORT 16 (BASKET) IMPLANT
BAG RETRIEVAL 12/15 (BASKET)
BAG RETRIEVAL 12/15MM (BASKET)
COVER BACK TABLE 60X90IN (DRAPES) ×3 IMPLANT
COVER TIP SHEARS 8 DVNC (MISCELLANEOUS) ×1 IMPLANT
COVER TIP SHEARS 8MM DA VINCI (MISCELLANEOUS) ×2
COVER WAND RF STERILE (DRAPES) IMPLANT
DERMABOND ADVANCED (GAUZE/BANDAGES/DRESSINGS) ×2
DERMABOND ADVANCED .7 DNX12 (GAUZE/BANDAGES/DRESSINGS) ×1 IMPLANT
DRAPE ARM DVNC X/XI (DISPOSABLE) ×4 IMPLANT
DRAPE COLUMN DVNC XI (DISPOSABLE) ×1 IMPLANT
DRAPE DA VINCI XI ARM (DISPOSABLE) ×8
DRAPE DA VINCI XI COLUMN (DISPOSABLE) ×2
DRAPE SHEET LG 3/4 BI-LAMINATE (DRAPES) ×3 IMPLANT
DRAPE SURG IRRIG POUCH 19X23 (DRAPES) ×3 IMPLANT
DRAPE UNDERBUTTOCKS STRL (DRAPE) ×3 IMPLANT
DRSG TEGADERM 4X4.75 (GAUZE/BANDAGES/DRESSINGS) ×2 IMPLANT
ELECT REM PT RETURN 15FT ADLT (MISCELLANEOUS) ×3 IMPLANT
GLOVE BIO SURGEON STRL SZ 6 (GLOVE) IMPLANT
GLOVE BIO SURGEON STRL SZ 6.5 (GLOVE) ×2 IMPLANT
GLOVE BIO SURGEONS STRL SZ 6.5 (GLOVE) ×2
GLOVE BIOGEL PI IND STRL 7.0 (GLOVE) ×3 IMPLANT
GLOVE BIOGEL PI INDICATOR 7.0 (GLOVE) ×6
GLOVE SURG SS PI 6.5 STRL IVOR (GLOVE) ×9 IMPLANT
GOWN STRL REUS W/ TWL LRG LVL3 (GOWN DISPOSABLE) ×1 IMPLANT
GOWN STRL REUS W/TWL LRG LVL3 (GOWN DISPOSABLE) ×2
GOWN STRL REUS W/TWL XL LVL3 (GOWN DISPOSABLE) ×6 IMPLANT
GYRUS RUMI II 2.5CM BLUE (DISPOSABLE)
GYRUS RUMI II 3.5CM BLUE (DISPOSABLE)
HOLDER FOLEY CATH W/STRAP (MISCELLANEOUS) ×3 IMPLANT
IRRIG SUCT STRYKERFLOW 2 WTIP (MISCELLANEOUS) ×3
IRRIGATION SUCT STRKRFLW 2 WTP (MISCELLANEOUS) ×1 IMPLANT
KIT PROCEDURE DA VINCI SI (MISCELLANEOUS)
KIT PROCEDURE DVNC SI (MISCELLANEOUS) IMPLANT
MANIPULATOR ADVINCU DEL 2.5 PL (MISCELLANEOUS) ×2 IMPLANT
MANIPULATOR UTERINE 4.5 ZUMI (MISCELLANEOUS) IMPLANT
NDL HYPO 25X1 1.5 SAFETY (NEEDLE) ×1 IMPLANT
NDL SPNL 18GX3.5 QUINCKE PK (NEEDLE) IMPLANT
NEEDLE HYPO 25X1 1.5 SAFETY (NEEDLE) IMPLANT
NEEDLE SPNL 18GX3.5 QUINCKE PK (NEEDLE) IMPLANT
OBTURATOR OPTICAL STANDARD 8MM (TROCAR) ×2
OBTURATOR OPTICAL STND 8 DVNC (TROCAR) ×1
OBTURATOR OPTICALSTD 8 DVNC (TROCAR) ×1 IMPLANT
PACK ROBOT GYN CUSTOM WL (TRAY / TRAY PROCEDURE) ×3 IMPLANT
PAD POSITIONING PINK XL (MISCELLANEOUS) ×3 IMPLANT
PORT ACCESS TROCAR AIRSEAL 12 (TROCAR) ×1 IMPLANT
PORT ACCESS TROCAR AIRSEAL 5M (TROCAR) ×2
POUCH SPECIMEN RETRIEVAL 10MM (ENDOMECHANICALS) IMPLANT
RUMI II 3.0CM BLUE KOH-EFFICIE (DISPOSABLE) IMPLANT
RUMI II GYRUS 2.5CM BLUE (DISPOSABLE) IMPLANT
RUMI II GYRUS 3.5CM BLUE (DISPOSABLE) IMPLANT
SEAL CANN UNIV 5-8 DVNC XI (MISCELLANEOUS) ×4 IMPLANT
SEAL XI 5MM-8MM UNIVERSAL (MISCELLANEOUS) ×8
SET TRI-LUMEN FLTR TB AIRSEAL (TUBING) ×3 IMPLANT
SURGIFLO W/THROMBIN 8M KIT (HEMOSTASIS) IMPLANT
SUT VIC AB 0 CT1 27 (SUTURE)
SUT VIC AB 0 CT1 27XBRD ANTBC (SUTURE) IMPLANT
SUT VIC AB 4-0 PS2 27 (SUTURE) ×6 IMPLANT
SUT VLOC 180 0 9IN  GS21 (SUTURE) ×2
SUT VLOC 180 0 9IN GS21 (SUTURE) ×1 IMPLANT
SYR 10ML LL (SYRINGE) IMPLANT
TIP RUMI ORANGE 6.7MMX12CM (TIP) IMPLANT
TIP UTERINE 5.1X6CM LAV DISP (MISCELLANEOUS) IMPLANT
TIP UTERINE 6.7X10CM GRN DISP (MISCELLANEOUS) IMPLANT
TIP UTERINE 6.7X6CM WHT DISP (MISCELLANEOUS) IMPLANT
TIP UTERINE 6.7X8CM BLUE DISP (MISCELLANEOUS) IMPLANT
TOWEL OR NON WOVEN STRL DISP B (DISPOSABLE) ×3 IMPLANT
TRAP SPECIMEN MUCOUS 40CC (MISCELLANEOUS) IMPLANT
TRAY FOLEY BAG SILVER LF 16FR (CATHETERS) ×2 IMPLANT
TRAY FOLEY MTR SLVR 16FR STAT (SET/KITS/TRAYS/PACK) ×1 IMPLANT
UNDERPAD 30X30 (UNDERPADS AND DIAPERS) ×3 IMPLANT
WATER STERILE IRR 1000ML POUR (IV SOLUTION) ×3 IMPLANT

## 2018-09-20 NOTE — Op Note (Signed)
OPERATIVE NOTE  Date: 09/20/18  Preoperative Diagnosis: Uterine cancer, s/p RSO and chemotherapy   Postoperative Diagnosis:   1. Uterine cancer 2. 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 Robotic-assisted laparoscopic hysterectomy, left salpingoophorectomy 2. Lysis of adhesions ~40 minutes  Surgeon: Bernita Raisin, MD  Assistant Surgeon: Lahoma Crocker M.D. (an MD assistant was necessary for tissue manipulation, management of robotic instrumentation, retraction and positioning due to the complexity of the case and hospital policies).   Anesthesia: GETA  Specimens: Uterus with attached cervix, left tube/ovary, right IP ligament  Complications: none  Indication for Procedure:  Patient with advanced uterine cancer s/p chemotherapy for completion surgery.  Operative Findings: Adhesive disease of colon to left sidewall/left adnexa (~30 min lysis of adhesions). Filmy adhesions of right colon to culdesac. Residual right IP ligament. Small areas of questionable peritoneal disease x 2 removed and sent for pathology. No other gross residual disease. There was adhesive disease between the bladder and the cervix/LUS.  Procedure in Detail:  The patient was taken to the operating room. Her arms were tucked to her side with all appropriate precautions in supine position. General anesthesia was induced. The patient was placed in the semi-lithotomy position in [place].  The patient was prepped and draped in the usual fashion.   A time out was performed. Foley catheter was placed.  A sterile speculum was placed in the vagina.  The cervix was grasped with a single-tooth tenaculum, sounded to 8cm then dilated with Kennon Rounds dilators.  The Advincula uterine manipulator with a small colpotomizer ring was placed without difficulty.  OG tube placement was confirmed and to suction.   Next, a 10 mm skin incision was made ~2 fingerbreadths below the subcostal  margin at prior site of the robotic surgical procedure, just medial to the midclavicular line.  The 5 mm Optiview port and scope was used for direct entry.  Opening pressure was under 12mm CO2.  The abdomen was insufflated and the findings were noted as above.   At this point and all points during the procedure, the patient's intra-abdominal pressure did not exceed 13 mmHg. Next, an 26mm skin incision was made inferior to the umbilicus and a right and left port was placed about 6-8cm lateral to the robot port on the right and left side.  A fourth trocar was placed in the right lateral abdominal wall, 6-8 cm from the last. All of the robotic trocars were placed under direct visualization.  Survey of the upper abdomen revealed normal findings. The patient was placed in steep Trendelenburg.  Bowel was gently placed into the upper abdomen.  The robot was docked in the normal manner.  Lysis of adhesions ~40 minutes total. The hysterectomy was started after the round ligament on the left side was incised and the retroperitoneum was entered and the pararectal space was developed.  The ureter was noted to be on the medial leaf of the broad ligament.  The peritoneum above the ureter was incised and stretched and the infundibulopelvic ligament was skeletonized, cauterized and cut.  The posterior peritoneum was taken down to the level of the KOH ring.  The anterior peritoneum was also taken down.  The bladder flap was created to the level of the KOH ring. The uterine artery on the left side was skeletonized, cauterized and cut in the normal manner.  A similar procedure was performed on the right. The colpotomy was made and the uterus, cervix, left ovary/tube, and right IP  ligament were amputated and delivered through the vagina.  Pedicles were inspected and excellent hemostasis was achieved.    The colpotomy at the vaginal cuff was closed with 0-Vloc in a running fashion. Irrigation was used and excellent hemostasis was  achieved.  At this point in the procedure was completed.  Robotic instruments were removed under direct visualization.  The robot was undocked. The fascia at the 10 mm (LUQ) trocar site and umbilical trocar site were closed with 0-Vicryl on a UR-6 needle. The skin was closed with 4-0 Vicryl in the dermis and the skin with 4-0 monocryl in a subcuticular manner.  Dermabond was applied.    The vagina was swabbed with  minimal bleeding noted.   All instrument and needle counts were correct x  2  Disposition: PACU  Condition: Stable

## 2018-09-20 NOTE — Anesthesia Procedure Notes (Signed)
Procedure Name: Intubation Date/Time: 09/20/2018 7:47 AM Performed by: Sharlette Dense, CRNA Patient Re-evaluated:Patient Re-evaluated prior to induction Oxygen Delivery Method: Circle system utilized Preoxygenation: Pre-oxygenation with 100% oxygen Induction Type: IV induction Ventilation: Mask ventilation without difficulty and Oral airway inserted - appropriate to patient size Laryngoscope Size: Sabra Heck and 2 Grade View: Grade I Tube type: Oral Tube size: 7.5 mm Number of attempts: 1 Airway Equipment and Method: Stylet Placement Confirmation: ETT inserted through vocal cords under direct vision,  positive ETCO2 and breath sounds checked- equal and bilateral Secured at: 21 cm Tube secured with: Tape Dental Injury: Teeth and Oropharynx as per pre-operative assessment

## 2018-09-20 NOTE — Transfer of Care (Signed)
Immediate Anesthesia Transfer of Care Note  Patient: Kathryn Stewart  Procedure(s) Performed: XI ROBOTIC ASSISTED TOTAL HYSTERECTOMY WITH LEFT SALPINGO OOPHORECTOMY (N/A )  Patient Location: PACU  Anesthesia Type:General  Level of Consciousness: drowsy  Airway & Oxygen Therapy: Patient Spontanous Breathing and Patient connected to face mask oxygen  Post-op Assessment: Report given to RN and Post -op Vital signs reviewed and stable  Post vital signs: Reviewed and stable  Last Vitals:  Vitals Value Taken Time  BP 127/97 09/20/2018 10:20 AM  Temp    Pulse 107 09/20/2018 10:22 AM  Resp 16 09/20/2018 10:22 AM  SpO2 100 % 09/20/2018 10:22 AM  Vitals shown include unvalidated device data.  Last Pain:  Vitals:   09/20/18 0606  TempSrc:   PainSc: 0-No pain         Complications: No apparent anesthesia complications

## 2018-09-20 NOTE — Interval H&P Note (Signed)
History and Physical Interval Note:  09/20/2018 7:14 AM  Kathryn Stewart  has presented today for surgery, with the diagnosis of UTERINE CANCER  The various methods of treatment have been discussed with the patient and family. After consideration of risks, benefits and other options for treatment, the patient has consented to  Procedure(s): XI ROBOTIC ASSISTED TOTAL HYSTERECTOMY WITH BILATERAL SALPINGO OOPHORECTOMY (Bilateral) as a surgical intervention .  The patient's history has been reviewed, patient examined, no change in status, stable for surgery.  I have reviewed the patient's chart and labs.  Questions were answered to the patient's satisfaction.   Patient had prior ovary removal. This procedure will remove the remaining adnexa and the uterus/cervix. ie Hysterectomy/unilateral salpingoophorectomy. Patient aware and signed addendum to consent.  [name]

## 2018-09-20 NOTE — Anesthesia Postprocedure Evaluation (Signed)
Anesthesia Post Note  Patient: Corryn Reynolds  Procedure(s) Performed: XI ROBOTIC ASSISTED TOTAL HYSTERECTOMY WITH LEFT SALPINGO OOPHORECTOMY (N/A )     Patient location during evaluation: PACU Anesthesia Type: General Level of consciousness: awake and alert and oriented Pain management: pain level controlled Vital Signs Assessment: post-procedure vital signs reviewed and stable Respiratory status: spontaneous breathing, nonlabored ventilation and respiratory function stable Cardiovascular status: blood pressure returned to baseline and stable Postop Assessment: no apparent nausea or vomiting Anesthetic complications: no    Last Vitals:  Vitals:   09/20/18 1100 09/20/18 1115  BP: (!) 129/102 (!) 145/93  Pulse: 99 96  Resp: 17 16  Temp:  36.9 C  SpO2: 94% 96%    Last Pain:  Vitals:   09/20/18 1115  TempSrc:   PainSc: 0-No pain                 Malichi Palardy A.

## 2018-09-20 NOTE — Progress Notes (Signed)
Repeat CBC to follow up on low platelet count per Dr. Gerarda Fraction

## 2018-09-20 NOTE — Interval H&P Note (Signed)
History and Physical Interval Note:  09/20/2018 7:13 AM  Kathryn Stewart  has presented today for surgery, with the diagnosis of UTERINE CANCER  The various methods of treatment have been discussed with the patient and family. After consideration of risks, benefits and other options for treatment, the patient has consented to  Procedure(s): XI ROBOTIC ASSISTED TOTAL HYSTERECTOMY WITH BILATERAL SALPINGO OOPHORECTOMY (Bilateral) as a surgical intervention .  The patient's history has been reviewed, patient examined, no change in status, stable for surgery.  I have reviewed the patient's chart and labs.  Questions were answered to the patient's satisfaction.     [name]

## 2018-09-21 ENCOUNTER — Telehealth: Payer: Self-pay | Admitting: *Deleted

## 2018-09-21 ENCOUNTER — Ambulatory Visit: Payer: BLUE CROSS/BLUE SHIELD | Admitting: Obstetrics

## 2018-09-21 ENCOUNTER — Encounter (HOSPITAL_COMMUNITY): Payer: Self-pay | Admitting: Obstetrics

## 2018-09-21 DIAGNOSIS — C541 Malignant neoplasm of endometrium: Secondary | ICD-10-CM | POA: Diagnosis not present

## 2018-09-21 LAB — BASIC METABOLIC PANEL
ANION GAP: 9 (ref 5–15)
BUN: 21 mg/dL — ABNORMAL HIGH (ref 6–20)
CO2: 25 mmol/L (ref 22–32)
Calcium: 8.9 mg/dL (ref 8.9–10.3)
Chloride: 106 mmol/L (ref 98–111)
Creatinine, Ser: 0.9 mg/dL (ref 0.44–1.00)
GFR calc Af Amer: >60 mL/min (ref >60)
GFR calc non Af Amer: >60 mL/min (ref >60)
Glucose, Bld: 145 mg/dL — ABNORMAL HIGH (ref 70–99)
Potassium: 4.5 mmol/L (ref 3.5–5.1)
Sodium: 140 mmol/L (ref 135–145)

## 2018-09-21 LAB — CBC
HCT: 27.7 % — ABNORMAL LOW (ref 36.0–46.0)
Hemoglobin: 8.9 g/dL — ABNORMAL LOW (ref 12.0–15.0)
MCH: 33.8 pg (ref 26.0–34.0)
MCHC: 32.1 g/dL (ref 30.0–36.0)
MCV: 105.3 fL — ABNORMAL HIGH (ref 80.0–100.0)
NRBC: 1.2 % — AB (ref 0.0–0.2)
PLATELETS: 75 10*3/uL — AB (ref 150–400)
RBC: 2.63 MIL/uL — ABNORMAL LOW (ref 3.87–5.11)
RDW: 22.3 % — ABNORMAL HIGH (ref 11.5–15.5)
WBC: 7.7 10*3/uL (ref 4.0–10.5)

## 2018-09-21 LAB — GLUCOSE, CAPILLARY: GLUCOSE-CAPILLARY: 120 mg/dL — AB (ref 70–99)

## 2018-09-21 MED ORDER — TRAMADOL HCL 50 MG PO TABS: 50.0000 mg | ORAL_TABLET | Freq: Four times a day (QID) | ORAL | 0 refills | Status: DC | PRN

## 2018-09-21 MED ORDER — DOCUSATE SODIUM 100 MG PO CAPS: 100.0000 mg | ORAL_CAPSULE | Freq: Two times a day (BID) | ORAL | 0 refills | Status: DC

## 2018-09-21 NOTE — Telephone Encounter (Signed)
Per staff message from Dr. Gerarda Fraction I have scheduled a lab appt for Tuesday of next week. It will placed in the patient's discharge AVS

## 2018-09-21 NOTE — Discharge Instructions (Signed)
09/21/2018  Return to work: 4-6 weeks if applicable  Come to the [facility] on Tuesday for repeat lab work. We will contact you with the results.  Activity: 1. Be up and out of the bed during the day.  Take a nap if needed.  You may walk up steps but be careful and use the hand rail.  Stair climbing will tire you more than you think, you may need to stop part way and rest.   2. No lifting or straining for 6 weeks.  3. Do not drive if you are taking narcotic pain medicine.  4. Shower daily.  Use soap and water on your incision and pat dry; don't rub.  No tub baths until cleared by your surgeon.   5. No sexual activity and nothing in the vagina for 12 weeks.  6. You may experience a small amount of clear drainage from your incisions, which is normal.  If the drainage persists or increases, please call the office.  7. You may experience vaginal spotting after surgery or around the 6-8 week mark from surgery when the stitches at the top of the vagina begin to dissolve.  The spotting is normal but if you experience heavy bleeding, call our office.  8. Take Tylenol or ibuprofen first for pain and only use tramadol for severe pain not relieved by the Tylenol or Ibuprofen.  Monitor your Tylenol intake to a max of 4,000 mg a day.  Diet: 1. Low sodium Heart Healthy Diet is recommended.  2. It is safe to use a laxative, such as Colace or senna, if you have difficulty moving your bowels. You can take Sennakot at bedtime every evening to keep bowel movements regular and to prevent constipation.    Wound Care: 1. Keep clean and dry.  Shower daily.  Reasons to call the Doctor:  Fever - Oral temperature greater than 100.4 degrees Fahrenheit  Foul-smelling vaginal discharge  Difficulty urinating  Nausea and vomiting  Increased pain at the site of the incision that is unrelieved with pain medicine.  Difficulty breathing with or without chest pain  New calf pain especially if only on one  side  Sudden, continuing increased vaginal bleeding with or without clots.   Contacts: For questions or concerns you should contact:  Dr. Precious Haws at [place], NP at [phone]  After Hours: call [phone] and have the GYN Oncologist paged/contacted  Tramadol tablets What is this medicine? TRAMADOL (TRA ma dole) is a pain reliever. It is used to treat moderate to severe pain in adults. This medicine may be used for other purposes; ask your health care provider or pharmacist if you have questions. COMMON BRAND NAME(S): Ultram What should I tell my health care provider before I take this medicine? They need to know if you have any of these conditions: -brain tumor -depression -drug abuse or addiction -head injury -if you frequently drink alcohol containing drinks -kidney disease or trouble passing urine -liver disease -lung disease, asthma, or breathing problems -seizures or epilepsy -suicidal thoughts, plans, or attempt; a previous suicide attempt by you or a family member -an unusual or allergic reaction to tramadol, codeine, other medicines, foods, dyes, or preservatives -pregnant or trying to get pregnant -breast-feeding How should I use this medicine? Take this medicine by mouth with a full glass of water. Follow the directions on the prescription label. You can take it with or without food. If it upsets your stomach, take it with food. Do not take  your medicine more often than directed. A special MedGuide will be given to you by the pharmacist with each prescription and refill. Be sure to read this information carefully each time. Talk to your pediatrician regarding the use of this medicine in children. Special care may be needed. Overdosage: If you think you have taken too much of this medicine contact a poison control center or emergency room at once. NOTE: This medicine is only for you. Do not share this medicine with others. What if I miss a  dose? If you miss a dose, take it as soon as you can. If it is almost time for your next dose, take only that dose. Do not take double or extra doses. What may interact with this medicine? Do not take this medication with any of the following medicines: -MAOIs like Carbex, Eldepryl, Marplan, Nardil, and Parnate This medicine may also interact with the following medications: -alcohol -antihistamines for allergy, cough and cold -certain medicines for anxiety or sleep -certain medicines for depression like amitriptyline, fluoxetine, sertraline -certain medicines for migraine headache like almotriptan, eletriptan, frovatriptan, naratriptan, rizatriptan, sumatriptan, zolmitriptan -certain medicines for seizures like carbamazepine, oxcarbazepine, phenobarbital, primidone -certain medicines that treat or prevent blood clots like warfarin -digoxin -furazolidone -general anesthetics like halothane, isoflurane, methoxyflurane, propofol -linezolid -local anesthetics like lidocaine, pramoxine, tetracaine -medicines that relax muscles for surgery -other narcotic medicines for pain or cough -phenothiazines like chlorpromazine, mesoridazine, prochlorperazine, thioridazine -procarbazine This list may not describe all possible interactions. Give your health care provider a list of all the medicines, herbs, non-prescription drugs, or dietary supplements you use. Also tell them if you smoke, drink alcohol, or use illegal drugs. Some items may interact with your medicine. What should I watch for while using this medicine? Tell your doctor or health care professional if your pain does not go away, if it gets worse, or if you have new or a different type of pain. You may develop tolerance to the medicine. Tolerance means that you will need a higher dose of the medicine for pain relief. Tolerance is normal and is expected if you take this medicine for a long time. Do not suddenly stop taking your medicine because  you may develop a severe reaction. Your body becomes used to the medicine. This does NOT mean you are addicted. Addiction is a behavior related to getting and using a drug for a non-medical reason. If you have pain, you have a medical reason to take pain medicine. Your doctor will tell you how much medicine to take. If your doctor wants you to stop the medicine, the dose will be slowly lowered over time to avoid any side effects. There are different types of narcotic medicines (opiates). If you take more than one type at the same time or if you are taking another medicine that also causes drowsiness, you may have more side effects. Give your health care provider a list of all medicines you use. Your doctor will tell you how much medicine to take. Do not take more medicine than directed. Call emergency for help if you have problems breathing or unusual sleepiness. You may get drowsy or dizzy. Do not drive, use machinery, or do anything that needs mental alertness until you know how this medicine affects you. Do not stand or sit up quickly, especially if you are an older patient. This reduces the risk of dizzy or fainting spells. Alcohol can increase or decrease the effects of this medicine. Avoid alcoholic drinks. You may have constipation. Try  to have a bowel movement at least every 2 to 3 days. If you do not have a bowel movement for 3 days, call your doctor or health care professional. Your mouth may get dry. Chewing sugarless gum or sucking hard candy, and drinking plenty of water may help. Contact your doctor if the problem does not go away or is severe. What side effects may I notice from receiving this medicine? Side effects that you should report to your doctor or health care professional as soon as possible: -allergic reactions like skin rash, itching or hives, swelling of the face, lips, or tongue -breathing problems -confusion -seizures -signs and symptoms of low blood pressure like dizziness;  feeling faint or lightheaded, falls; unusually weak or tired -trouble passing urine or change in the amount of urine Side effects that usually do not require medical attention (report to your doctor or health care professional if they continue or are bothersome): -constipation -dry mouth -nausea, vomiting -tiredness This list may not describe all possible side effects. Call your doctor for medical advice about side effects. You may report side effects to FDA at 1-800-FDA-1088. Where should I keep my medicine? Keep out of the reach of children. This medicine may cause accidental overdose and death if it taken by other adults, children, or pets. Mix any unused medicine with a substance like cat litter or coffee grounds. Then throw the medicine away in a sealed container like a sealed bag or a coffee can with a lid. Do not use the medicine after the expiration date. Store at room temperature between 15 and 30 degrees C (59 and 86 degrees F). NOTE: This sheet is a summary. It may not cover all possible information. If you have questions about this medicine, talk to your doctor, pharmacist, or health care provider.  2019 Elsevier/Gold Standard (2015-05-17 09:00:04)

## 2018-09-21 NOTE — Discharge Summary (Signed)
Physician Discharge Summary  Patient ID: Kathryn Stewart MRN: [id number] DOB/AGE: 1981/04/08 38 y.o.  Admit date: 09/20/2018 Discharge date: 09/21/2018  Admission Diagnoses: Uterine cancer [facility])  Discharge Diagnoses:  Principal Problem:   Uterine cancer [facility]) Active Problems:   Endometrial cancer [facility])   Discharged Condition:  The patient is in good condition and stable for discharge.    Hospital Course: On 09/20/2018, the patient underwent the following: Procedure(s): XI ROBOTIC ASSISTED TOTAL HYSTERECTOMY WITH LEFT SALPINGO OOPHORECTOMY.  The postoperative course was uneventful.  She was discharged to home on postoperative day 1 tolerating a regular diet, voiding, pain controlled, ambulating without difficulty.  She is to come to the office on Tuesday for a repeat CBC to follow up on platelet count.  Consults: None  Significant Diagnostic Studies: None  Treatments: surgery: see above  Discharge Exam: Blood pressure 101/82, pulse 100, temperature 98.8 F (37.1 C), temperature source Oral, resp. rate 17, height 5\' 1"  (1.549 m), weight 173 lb (78.5 kg), last menstrual period 04/07/2018, SpO2 93 %. General appearance: alert, cooperative and no distress Resp: clear to auscultation bilaterally Cardio: regular rate and rhythm, S1, S2 normal, no murmur, click, rub or gallop GI: soft, non-tender; bowel sounds normal; no masses,  no organomegaly Extremities: extremities normal, atraumatic, no cyanosis or edema Incision/Wound: Lap sites to the abdomen with dermabond without erythema or drainage  Disposition: Discharge disposition: 01-Home or Self Care       Discharge Instructions    Call MD for:  difficulty breathing, headache or visual disturbances   Complete by:  As directed    Call MD for:  extreme fatigue   Complete by:  As directed    Call MD for:  hives   Complete by:  As directed    Call MD for:  persistant dizziness or light-headedness   Complete by:  As directed    Call MD for:  persistant nausea and vomiting   Complete by:  As directed    Call MD for:  redness, tenderness, or signs of infection (pain, swelling, redness, odor or green/yellow discharge around incision site)   Complete by:  As directed    Call MD for:  severe uncontrolled pain   Complete by:  As directed    Call MD for:  temperature >100.4   Complete by:  As directed    Diet - low sodium heart healthy   Complete by:  As directed    Driving Restrictions   Complete by:  As directed    Do not take narcotics and drive.   Increase activity slowly   Complete by:  As directed    Lifting restrictions   Complete by:  As directed    No lifting greater than 10 lbs.   Sexual Activity Restrictions   Complete by:  As directed    No sexual activity, nothing in the vagina, for 12 weeks.     Allergies as of 09/21/2018      Reactions   Latex Hives, Rash   Per DR. Gerarda Fraction. Question if this is a latex allergy versus Dermabond    Other Rash   Dermabond      Medication List    STOP taking these medications   dexamethasone 4 MG tablet Commonly known as:  DECADRON     TAKE these medications   amLODipine 5 MG tablet Commonly known as:  NORVASC Take 5 mg by mouth at bedtime.   docusate sodium 100 MG capsule Commonly known as:  COLACE Take 1  capsule (100 mg total) by mouth 2 (two) times daily.   JARDIANCE 10 MG Tabs tablet Generic drug:  empagliflozin Take 10 mg by mouth daily.   lidocaine-prilocaine cream Commonly known as:  EMLA Apply 1 application topically as needed.   lisinopril 20 MG tablet Commonly known as:  PRINIVIL,ZESTRIL Take 20 mg by mouth at bedtime.   metFORMIN 500 MG tablet Commonly known as:  GLUCOPHAGE Take 1,000 mg by mouth 2 (two) times daily with a meal. At lunch and at bedtime   ondansetron 8 MG tablet Commonly known as:  ZOFRAN Take 1 tablet (8 mg total) by mouth every 8 (eight) hours as needed for nausea.   phenylephrine 10 MG Tabs tablet Commonly  known as:  SUDAFED PE Take 10 mg by mouth 2 (two) times daily.   prochlorperazine 10 MG tablet Commonly known as:  COMPAZINE TAKE 1 TABLET BY MOUTH EVERY 6 HOURS AS NEEDED FOR NAUSEA FOR VOMITING What changed:    how much to take  how to take this  when to take this  reasons to take this   traMADol 50 MG tablet Commonly known as:  ULTRAM Take 1 tablet (50 mg total) by mouth every 6 (six) hours as needed for moderate pain or severe pain. Do not take and drive   TRESIBA FLEXTOUCH 100 UNIT/ML Sopn FlexTouch Pen Generic drug:  insulin degludec Inject 33 Units into the skin daily.      Follow-up Information    [name], MD Follow up on 10/03/2018.   Specialty:  Gynecologic Oncology Why:  at 3:15pm at the [facility],The information: [place] [id number] [phone]           Greater than thirty minutes were spend for face to face discharge instructions and discharge orders/summary in EPIC.   Signed: Dorothyann Gibbs 09/21/2018, 10:06 AM

## 2018-09-21 NOTE — Progress Notes (Signed)
Pt stable to d/c home. Discharge paperwork, AVS, and prescriptions reviewed with pt. Pt confirmed understanding of d/c with teachback method. PIV removed without complication. Pt escorted off of unit to car and care of pt's husdband.

## 2018-09-24 ENCOUNTER — Telehealth: Payer: Self-pay | Admitting: *Deleted

## 2018-09-24 LAB — TYPE AND SCREEN
ABO/RH(D): A POS
ANTIBODY SCREEN: NEGATIVE
Unit division: 0
Unit division: 0
Unit division: 0
Unit division: 0

## 2018-09-24 LAB — BPAM RBC
Blood Product Expiration Date: 202002082359
Blood Product Expiration Date: 202002082359
Blood Product Expiration Date: 202002082359
Blood Product Expiration Date: 202002082359
ISSUE DATE / TIME: 202001160815
ISSUE DATE / TIME: 202001160815
Unit Type and Rh: 6200
Unit Type and Rh: 6200
Unit Type and Rh: 6200
Unit Type and Rh: 6200

## 2018-09-24 NOTE — Telephone Encounter (Signed)
I called patient to see how she was doing post-operatively.  Patient states that she is doing pretty good.  Patient states that she is able to void and have a bowel movement, the pain is very well controlled, she states that " I haven't had to take any of the prescribed pain medication, I'm just taking Tylenol Motrin, no bleeding or vaginal discharge, and I am able to move around.  I told patient if she had any other questions or concerns to please give our office a call.  Patient verbalized understanding. No further concerns at this time.

## 2018-09-25 ENCOUNTER — Inpatient Hospital Stay: Payer: BLUE CROSS/BLUE SHIELD

## 2018-09-25 ENCOUNTER — Telehealth: Payer: Self-pay

## 2018-09-25 DIAGNOSIS — D696 Thrombocytopenia, unspecified: Secondary | ICD-10-CM

## 2018-09-25 DIAGNOSIS — C541 Malignant neoplasm of endometrium: Secondary | ICD-10-CM | POA: Diagnosis not present

## 2018-09-25 LAB — CBC WITH DIFFERENTIAL (CANCER CENTER ONLY)
Abs Immature Granulocytes: 0.08 K/uL — ABNORMAL HIGH (ref 0.00–0.07)
Basophils Absolute: 0 K/uL (ref 0.0–0.1)
Basophils Relative: 0 %
Eosinophils Absolute: 0 K/uL (ref 0.0–0.5)
Eosinophils Relative: 0 %
HCT: 30.8 % — ABNORMAL LOW (ref 36.0–46.0)
Hemoglobin: 10 g/dL — ABNORMAL LOW (ref 12.0–15.0)
Immature Granulocytes: 1 %
Lymphocytes Relative: 31 %
Lymphs Abs: 2 K/uL (ref 0.7–4.0)
MCH: 33.7 pg (ref 26.0–34.0)
MCHC: 32.5 g/dL (ref 30.0–36.0)
MCV: 103.7 fL — ABNORMAL HIGH (ref 80.0–100.0)
Monocytes Absolute: 0.6 K/uL (ref 0.1–1.0)
Monocytes Relative: 10 %
Neutro Abs: 3.7 K/uL (ref 1.7–7.7)
Neutrophils Relative %: 58 %
Platelet Count: 77 K/uL — ABNORMAL LOW (ref 150–400)
RBC: 2.97 MIL/uL — ABNORMAL LOW (ref 3.87–5.11)
RDW: 20.2 % — ABNORMAL HIGH (ref 11.5–15.5)
WBC Count: 6.5 K/uL (ref 4.0–10.5)
nRBC: 0.8 % — ABNORMAL HIGH (ref 0.0–0.2)

## 2018-09-25 NOTE — Telephone Encounter (Signed)
Told Kathryn Stewart that that her Hgb today was up to 10.0 from 8.8 on 09-21-18. Her platelets are stable at 77 k per [name],NP. She reports no bleeding and continues to feel good as noted yesterday 09-24-2018 with follow up call.

## 2018-10-03 ENCOUNTER — Encounter (INDEPENDENT_AMBULATORY_CARE_PROVIDER_SITE_OTHER): Payer: Self-pay | Admitting: Ophthalmology

## 2018-10-03 ENCOUNTER — Inpatient Hospital Stay (HOSPITAL_BASED_OUTPATIENT_CLINIC_OR_DEPARTMENT_OTHER): Payer: BLUE CROSS/BLUE SHIELD | Admitting: Obstetrics

## 2018-10-03 ENCOUNTER — Encounter: Payer: Self-pay | Admitting: Obstetrics

## 2018-10-03 VITALS — BP 110/80 | HR 118 | Temp 98.5°F | Resp 20 | Ht 61.0 in | Wt 176.5 lb

## 2018-10-03 DIAGNOSIS — Z90722 Acquired absence of ovaries, bilateral: Secondary | ICD-10-CM

## 2018-10-03 DIAGNOSIS — Z90710 Acquired absence of both cervix and uterus: Secondary | ICD-10-CM

## 2018-10-03 DIAGNOSIS — C541 Malignant neoplasm of endometrium: Secondary | ICD-10-CM

## 2018-10-03 MED ORDER — METFORMIN HCL 500 MG PO TABS: 1000.0000 mg | ORAL_TABLET | Freq: Two times a day (BID) | ORAL | 1 refills | Status: DC

## 2018-10-03 NOTE — Patient Instructions (Signed)
Return in one month for vaginal cuff check with [facility]

## 2018-10-03 NOTE — Progress Notes (Signed)
[facility] [place]   Progress Note: Established Patient Follow-Up   Consult was originally requested by Dr. Rubbie Battiest for an adnexal mass that was resected and found to be consistent with endometrioid adenocarcinoma.  In addition an endometrial biopsy showing the same.  Chief Complaint  Patient presents with  . Endometrial ca [facility])   GYN Oncologic Summary 1. 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 Possible parametrial involvement)  05/04/18 - 08/28/18 Carbo/Taxol x 6 cycles (had one week hold C6 due to thrombo)  09/20/18 - Robo TLH/LSO (prior RSO diagnosing her cancer) 2. Genetics with [employer] - no pathogenic mutations.  HPI: Ms. Joby Hershkowitz  is a very nice 38 y.o.  P0   Interval History  She has treatment related alopecia. No complaints. Denies bleeding, denies pain.  I took her to surgery 09/20/18 for a robotic TLH/LSO. Pathology revealed the remaining ovary also had disease: 1. Soft tissue, biopsy, right broad ligament nodule - BENIGN FIBROUS TISSUE WITH FOREIGN BODY GIANT CELL REACTION. - THERE IS NO EVIDENCE OF MALIGNANCY. 2. Soft tissue, biopsy, uterine serosal nodule - BENIGN FIBROUS TISSUE WITH FOREIGN BODY GIANT CELL REACTION. - THERE IS NO EVIDENCE OF MALIGNANCY. 3. Uterus and cervix, left tube and ovary - ENDOMETRIUM: ENDOMETRIOID ADENOCARCINOMA, FIGO GRADE I/III, SCATTERED MICROSCOPIC FOCI. - ADENOCARCINOMA INVOLVES THE INNER HALF OF THE MYOMETRIUM. - MYOMETRIUM: LEIOMYOMA. - SEROSA: ADHESIONS. - RIGHT FALLOPIAN TUBE: UNREMARKABLE. - LEFT ADNEXA: ENDOMETRIOID ADENOCARCINOMA INVOLVING THE OVARY. - BENIGN FALLOPIAN TUBE. - SEE ONCOLOGY TABLE BELOW. Microscopic Comment 3. UTERUS, CARCINOMA OR CARCINOSARCOMA Procedure: Hysterectomy, bilateral fallopian tube resection, and left ovarian  oophorectomy. Histologic type: Endometrioid adenocarcinoma. Histologic Grade: FIGO Grade I Myometrial invasion: Depth of invasion: 2 mm Myometrial thickness: 15 mm Uterine Serosa Involvement: Not identified Cervical stromal involvement: Not identified Extent of involvement of other organs: Involves the left ovary Lymphovascular invasion: Present Regional Lymph Nodes: None examined MMR testing by IHC will be performed on block 3-N Pathologic Stage Classification (pTNM, AJCC 8th edition): pT3, pNX FIGO Stage: At least FIGO Stage MRM was negative  She is doing well. Normal bowel/bladder function. Denies fever. No N/V.    Oncologic Course She was in her usual state of health until the evening of 04/06/18 at which time she woke up with significant right lower quadrant pain.  She presented to the emergency room after the pain woke her up CT imaging was performed 04/07/2018 [facility] rocking him.  A 6.5 x 7 x 8.1 complex right adnexal mass was found with solid and fat components consistent with a teratoma.  No lymphadenopathy.  Small to moderate ascites and inflammatory changes were seen in the pelvis follow-up ultrasound was also performed showing concern for right ovarian torsion with a large ovarian mass cystic and solid components 9.7 cm.  A left ovarian cyst seen 3.4 cm.  Endometrial thickening with hypervascularity and endometrial thickness of 24 mm moderate pelvic free fluid.  Given the concern for torsion she was taken urgently to the operating room by Dr. Orson Ape. McLeod 04/07/2018.  At that time operative laparoscopy with right salpingooophorectomy and findings consistent with torsion was performed.  In addition endometrial biopsy was performed.  Unfortunately final pathology reveals in the right ovary and endometrioid type adenocarcinoma the endometrial biopsy showed endometrioid type adenocarcinoma.  Comments include a favoring of endometrium as the primary.  The patient is therefore referred for  recommendations and probable management.  She denied nausea , vomiting, denied recent bloating or pain denied any changes in her bowel habits or urinary habits.  She denies excess weight loss and she states she has a normal appetite.  She does admit to a long history of abnormal menses.  She states that she started her menstrual cycle at age 19.  There is documentation on the chart of possible PCOS.  Periods are described as both irregular and heavy with episodes intermittently of amenorrhea.  I took her to surgery 04/24/18 planning to do a completion surgery however she had left cornual/serosal disease fusing to the sigmoid and possibly emanating from her left parametrium. Intraoperative findings as follows: Significant adhesive disease almost miliary-like. The sigmoid was adherent to the left cornua. After takedown of the sigmoid in this region it was clear the left cornua was involved in an abnormal process including necrotic tissue clinically concerning for malignancy. In addition the bladder was adherent to this region. Posterior surface of the broad ligament in area c/w the parametria on the left showed likely tumor breakthrough. Images were taken. Biopsy from the tissue extruding from the left side of the uterus in the presumed parametrium was sent for frozen. This returned worrisome for disease, but not definitive. A separate area of the sigmoid was adherent inferior to the posterior surface lesion and the remnant of this adhesion was sent as sigmoid peritoneum. Tissue biopsies as follows: Peritoneum, biopsy (Sigmoid peritoneum) - METASTATIC CARCINOMA CONSISTENT WITH PATIENT'S CLINICAL HISTORY OF PRIMARY ENDOMETRIAL CARCINOMA. SEE NOTE 2. Soft tissue, biopsy, parametrial - METASTATIC CARCINOMA CONSISTENT WITH PATIENT'S CLINICAL HISTORY OF PRIMARY ENDOMETRIAL CARCINOMA. SEE NOTE  She started chemotherapy with Carboplatin/Taxol 05/04/18 and completed 08/28/18   Imported EPIC Oncologic History:   Oncology History   Genetics neg     Endometrial cancer ([place] [facility] [place])   04/06/2018 Initial Diagnosis    She presented with severe abdominal pain    04/07/2018 Imaging    She presented to the emergency room after the pain woke her up CT imaging was performed 04/07/2018 [facility].  A 6.5 x 7 x 8.1 complex right adnexal mass was found with solid and fat components consistent with a teratoma.  No lymphadenopathy.  Small to moderate ascites and inflammatory changes were seen in the pelvis follow-up ultrasound was also performed showing concern for right ovarian torsion with a large ovarian mass cystic and solid components 9.7 cm.  A left ovarian cyst seen 3.4 cm.  Endometrial thickening with hypervascularity and endometrial thickness of 24 mm moderate pelvic free fluid.    04/07/2018 Surgery    Given the concern for torsion she was taken urgently to the operating room by Dr. Orson Ape. McLeod 04/07/2018.  At that time operative laparoscopy, torsion of the right adnexa was noted along with thickened abnormal endometrium.  She had right salpingo-oophorectomy through a laparoscopic approach along with endometrial biopsy.      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 Biopsy of  left parametrial tissue and sigmoid peritoneum  Surgeon: Bernita Raisin, MD  Specimens: pelvic washings, parametrial biopsy, sigmoid peritoneum biopsy  Complications:  none  Indication for Procedure:  Patient found at OSH to have right ovarian mass that was resected and found on permanent section to be c/w endometrial cancer. Also had endometrial biopsy showing the same.  Operative Findings: Significant adhesive disease almost miliary-like. The sigmoid was adherent to the left cornua. After takedown of the sigmoid in this region it was clear the left cornua was involved in an abnormal process including necrotic tissue clinically concerning for malignancy. In addition the bladder was adherent to this region. Posterior surface of the broad ligament in area c/w the parametria on the left showed likely tumor breakthrough. Images were taken. Biopsy from the tissue extruding from the left side of the uterus in the presumed parametrium was sent for frozen. This returned worrisome for disease, but not definitive. A separate area of the sigmoid was adherent inferior to the posterior surface lesion and the remnant of this adhesion was sent as sigmoid peritoneum. This, on frozen, returned with fibrosis.     04/24/2018 Pathology Results    1. Peritoneum, biopsy - METASTATIC CARCINOMA CONSISTENT WITH PATIENT'S CLINICAL HISTORY OF PRIMARY ENDOMETRIAL CARCINOMA. SEE NOTE 2. Soft tissue, biopsy, parametrial - METASTATIC CARCINOMA CONSISTENT WITH PATIENT'S CLINICAL HISTORY OF PRIMARY ENDOMETRIAL CARCINOMA. SEE NOTE Diagnosis Note 1. and 2. Immunohistochemical stains show that the tumor cells are positive for PAX8 and negative for calretinin, consistent with the above diagnosis    04/25/2018 Cancer Staging    Staging form: Corpus Uteri - Carcinoma and Carcinosarcoma, AJCC 8th Edition - Clinical: Stage IVA (cT4, cN0, cM0) - Signed by Heath Lark, MD on 04/25/2018    05/02/2018 Tumor Marker    Patient's tumor was tested for the following markers: CA-125 Results of the tumor marker test revealed 56.7    05/04/2018 -  Chemotherapy    The patient had carboplatin and Taxol     05/08/2018 Procedure    Placement of a power injectable Port-A-Cath    05/29/2018 Tumor Marker    Patient's tumor was tested for the following markers: CA-125 Results of the tumor marker test revealed 31.6    06/19/2018 Tumor Marker    Patient's tumor was tested for the following markers: CA-125 Results of the tumor marker test revealed 21.1    06/28/2018 Imaging    No evidence of malignancy or other acute findings within the abdomen or pelvis.  Moderate hepatic steatosis.    07/10/2018 Tumor Marker    Patient's tumor was tested for the following markers: CA-125 Results of the tumor marker test revealed 12.6     Measurement of disease: CA125 potentially and clinical exam, likely will plan imaging for some portion of surveillance . Preop (however after RSO) 04/23/18 = 73.2 . 05/02/18 = 56.7 (prechemo)  Radiology: Mr Pelvis W Wo Contrast  Result Date: 09/04/2018 CLINICAL DATA:  Endometrial cancer EXAM: MRI PELVIS WITHOUT AND WITH CONTRAST TECHNIQUE: Multiplanar multisequence MR imaging of the pelvis was performed both before and after administration of intravenous contrast. CONTRAST:  10 mL Gadovist IV COMPARISON:  CT abdomen/pelvis dated 06/27/2018. Pelvic ultrasound dated 04/07/2018. FINDINGS: Urinary Tract:  Bladder is thick-walled although underdistended. Bowel: Visualized bowel is unremarkable, noting a normal appendix (series 5/image 6). Vascular/Lymphatic: No evidence of aneurysm. No pelvic lymphadenopathy. Reproductive: 2.0 x 2.2 endometrial mass, measuring 7 mm in thickness, previously 2.4 cm in thickness on prior ultrasound. This corresponds to the  patient's known endometrial cancer. While some myometrial invasion cannot be excluded, it does not currently appear to include more than 1/2 of the myometrial thickness (series 1102/image 45). Status post right oophorectomy. Left kidney is notable for a dominant 16 mm cyst/follicle, likely physiologic. No enhancing ovarian mass. Other:  No  pelvic ascites. No gross peritoneal nodularity. Musculoskeletal: No focal osseous lesions. IMPRESSION: Improving endometrial mass, now measuring 7 mm in thickness, corresponding to the patient's known endometrial cancer. Status post right oophorectomy. No pelvic ascites or gross peritoneal nodularity. Electronically Signed   By: Julian Hy M.D.   On: 09/04/2018 08:30  09/04/18 MRI pelvis - as above . 04/07/18 - CT AP and TVUS as noted in HPI  Outpatient Encounter Medications as of 10/03/2018  Medication Sig  . amLODipine (NORVASC) 5 MG tablet Take 5 mg by mouth at bedtime.   . docusate sodium (COLACE) 100 MG capsule Take 1 capsule (100 mg total) by mouth 2 (two) times daily.  . empagliflozin (JARDIANCE) 10 MG TABS tablet Take 10 mg by mouth daily.   . insulin degludec (TRESIBA FLEXTOUCH) 100 UNIT/ML SOPN FlexTouch Pen Inject 33 Units into the skin daily.   Marland Kitchen lidocaine-prilocaine (EMLA) cream Apply 1 application topically as needed.  Marland Kitchen lisinopril (PRINIVIL,ZESTRIL) 20 MG tablet Take 20 mg by mouth at bedtime.   . metFORMIN (GLUCOPHAGE) 500 MG tablet Take 2 tablets (1,000 mg total) by mouth 2 (two) times daily with a meal. At lunch and at bedtime  . ondansetron (ZOFRAN) 8 MG tablet Take 1 tablet (8 mg total) by mouth every 8 (eight) hours as needed for nausea.  . phenylephrine (SUDAFED PE) 10 MG TABS tablet Take 10 mg by mouth 2 (two) times daily.  . prochlorperazine (COMPAZINE) 10 MG tablet TAKE 1 TABLET BY MOUTH EVERY 6 HOURS AS NEEDED FOR NAUSEA FOR VOMITING (Patient taking differently: Take 10 mg by mouth every 6 (six) hours as needed for nausea or vomiting. TAKE 1 TABLET BY MOUTH EVERY 6 HOURS AS NEEDED FOR NAUSEA FOR VOMITING)  . traMADol (ULTRAM) 50 MG tablet Take 1 tablet (50 mg total) by mouth every 6 (six) hours as needed for moderate pain or severe pain. Do not take and drive  . [DISCONTINUED] metFORMIN (GLUCOPHAGE) 500 MG tablet Take 1,000 mg by mouth 2 (two) times daily with a meal.  At lunch and at bedtime   No facility-administered encounter medications on file as of 10/03/2018.    Allergies  Allergen Reactions  . Latex Hives and Rash    Per DR. Gerarda Fraction. Question if this is a latex allergy versus Dermabond   . Other Rash    Dermabond    Past Medical History:  Diagnosis Date  . Arthritis   . Asthma   . 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 GERD (gastroesophageal reflux disease)   . Hypertension   . PONV (postoperative nausea and vomiting)    Past Surgical History:  Procedure Laterality Date  . IR IMAGING GUIDED PORT INSERTION  05/08/2018  . LAPAROSCOPIC SALPINGOOPHERECTOMY Right 2019   torsion  . ROBOTIC ASSISTED TOTAL HYSTERECTOMY WITH BILATERAL SALPINGO OOPHERECTOMY N/A 04/24/2018   Procedure: XI ROBOTIC  DIAGNOSTIC LAPAROSCOPY WITH BIOPSY.;- DID NOT HAVE THIS SURGERY!  . ROBOTIC ASSISTED TOTAL HYSTERECTOMY WITH BILATERAL SALPINGO OOPHERECTOMY N/A 09/20/2018   Procedure: XI ROBOTIC ASSISTED TOTAL HYSTERECTOMY WITH LEFT SALPINGO OOPHORECTOMY;  Surgeon: [name], MD;  Location: [facility];  Service: Gynecology;  Laterality: N/A;  Past Gynecological History:   GYNECOLOGIC HISTORY:  . No LMP recorded. (Menstrual status: Chemotherapy).  . Menarche: 38 years old . P 0 . Contraceptive none . HRT N/A  . Last Pap sure Family Hx:  Family History  Problem Relation Age of Onset  . Diabetes Mother   . Cervical cancer Mother   . Throat cancer Mother 45  . Heart attack Father   . Diabetes Brother   . Ovarian cancer Maternal Aunt 21  . Uterine cancer Maternal Aunt 21  . Breast cancer Maternal Aunt 21  . Epilepsy Paternal Aunt   . Kidney cancer Brother 23  . Ovarian cancer Cousin 94   Social Hx:  Marland Kitchen Tobacco use: None . Alcohol use: None . Illicit Drug use: None . Illicit IV Drug use: Never    Review of Systems: Review of Systems  Constitutional: Negative.   HENT:  Negative.   Eyes: Negative.   Respiratory:  Negative.   Cardiovascular: Negative.   Gastrointestinal: Negative.   Endocrine: Negative.   Genitourinary: Negative.    Musculoskeletal: Negative.   Skin: Negative.   Neurological: Negative.   Hematological: Negative.   Psychiatric/Behavioral: Negative.   All other systems reviewed and are negative.  Vitals: Blood pressure 121/87, pulse 98, temperature 98.7 F (37.1 C), temperature source Oral, resp. rate 20, height 5' 1.5" (1.562 m), weight 166 lb 12.8 oz (75.7 kg), SpO2 100 %. Vitals:   10/03/18 1512  Weight: 176 lb 8 oz (80.1 kg)  Height: _0  (1.549 m)    Vitals:   10/03/18 1512  BP: 110/80  Pulse: (!) 118  Resp: 20  Temp: 98.5 F (36.9 C)  SpO2: 99%   Body mass index is 33.35 kg/m.   Physical Exam: General :  Well developed, 38 y.o., female in no apparent distress HEENT:  Normocephalic/atraumatic, symmetric, EOMI, eyelids normal Neck:   No visible masses.  Respiratory:  Respirations unlabored, no use of accessory muscles CV:   Deferred Breast:  Deferred Musculoskeletal: Normal muscle strength. Abdomen:  Trocar sites are CDI No visible masses or protrusion Extremities:  No visible edema or deformities Skin:   Normal inspection Neuro/Psych:  No focal motor deficit, no abnormal mental status. Normal gait. Normal affect. Alert and oriented to person, place, and time     Assessment  Locally advanced (pelvic peritoneum) Endometrial cancer  Plan  1. We previously discussed referral to genetics given her young age. She has seen them and test results showed no pathogenic mutations 2. Management discussion ? She has completed neoadjuvant chemotherapy and now surgery ? We talked about brachytherapy postop given the possible parametrial involvement and we have discussed this on prior visits to decrease her risk of local (vag cuff) recurrence and she agrees to referral  3. Return in one month for a vaginal cuff check with Melissa then plan surveillance exams every 3  months 4. Activity restrictions were discussed.   [name], MD  10/03/2018, 3:55 PM    Cc: Albertina Parr, MD (Referring Ob/Gyn) Monico Blitz, MD (PCP)

## 2018-10-04 ENCOUNTER — Telehealth: Payer: Self-pay | Admitting: Oncology

## 2018-10-04 NOTE — Telephone Encounter (Signed)
Left a message for patient with Radiation Oncology appointment with Dr. Sondra Come on 10/24/18 at 3:30 pm.

## 2018-10-24 ENCOUNTER — Other Ambulatory Visit: Payer: Self-pay

## 2018-10-24 ENCOUNTER — Ambulatory Visit
Admission: RE | Admit: 2018-10-24 | Discharge: 2018-10-24 | Disposition: A | Discharge status: Home / Self Care | Payer: BLUE CROSS/BLUE SHIELD | Source: Ambulatory Visit | Attending: Radiation Oncology | Admitting: Radiation Oncology

## 2018-10-24 ENCOUNTER — Encounter: Payer: Self-pay | Admitting: Radiation Oncology

## 2018-10-24 VITALS — BP 123/91 | HR 100 | Temp 98.2°F | Resp 18 | Ht 61.5 in | Wt 175.1 lb

## 2018-10-24 DIAGNOSIS — Z90710 Acquired absence of both cervix and uterus: Secondary | ICD-10-CM | POA: Insufficient documentation

## 2018-10-24 DIAGNOSIS — Z79899 Other long term (current) drug therapy: Secondary | ICD-10-CM | POA: Insufficient documentation

## 2018-10-24 DIAGNOSIS — Z803 Family history of malignant neoplasm of breast: Secondary | ICD-10-CM | POA: Diagnosis not present

## 2018-10-24 DIAGNOSIS — C541 Malignant neoplasm of endometrium: Secondary | ICD-10-CM | POA: Diagnosis present

## 2018-10-24 DIAGNOSIS — K760 Fatty (change of) liver, not elsewhere classified: Secondary | ICD-10-CM | POA: Insufficient documentation

## 2018-10-24 DIAGNOSIS — Z9221 Personal history of antineoplastic chemotherapy: Secondary | ICD-10-CM | POA: Diagnosis not present

## 2018-10-24 DIAGNOSIS — Z794 Long term (current) use of insulin: Secondary | ICD-10-CM | POA: Diagnosis not present

## 2018-10-24 DIAGNOSIS — C7961 Secondary malignant neoplasm of right ovary: Secondary | ICD-10-CM | POA: Insufficient documentation

## 2018-10-24 DIAGNOSIS — Z8049 Family history of malignant neoplasm of other genital organs: Secondary | ICD-10-CM | POA: Insufficient documentation

## 2018-10-24 DIAGNOSIS — Z800 Family history of malignant neoplasm of digestive organs: Secondary | ICD-10-CM | POA: Insufficient documentation

## 2018-10-24 DIAGNOSIS — Z8051 Family history of malignant neoplasm of kidney: Secondary | ICD-10-CM | POA: Insufficient documentation

## 2018-10-24 DIAGNOSIS — R51 Headache: Secondary | ICD-10-CM | POA: Insufficient documentation

## 2018-10-24 DIAGNOSIS — Z90722 Acquired absence of ovaries, bilateral: Secondary | ICD-10-CM | POA: Diagnosis not present

## 2018-10-24 DIAGNOSIS — Z8041 Family history of malignant neoplasm of ovary: Secondary | ICD-10-CM | POA: Diagnosis not present

## 2018-10-24 NOTE — Progress Notes (Signed)
Radiation Oncology         [phone] ________________________________  Initial Outpatient Consultation  Name: Kathryn Stewart MRN: [id number]  Date: 10/24/2018  DOB: 08-15-1981  JQ:BHAL, Algernon Huxley, PA-C  [name], MD   REFERRING PHYSICIAN: [name], MD  DIAGNOSIS: The encounter diagnosis was Endometrial cancer [facility]).   Clinical: Stage IVA (cT4, cN0, cM0) endometrial cancer   Oncology History   Genetics neg     Endometrial cancer ([place])   04/06/2018 Initial Diagnosis    She presented with severe abdominal pain    04/07/2018 Imaging    She presented to the emergency room after the pain woke her up CT imaging was performed 04/07/2018 [facility].  A 6.5 x 7 x 8.1 complex right adnexal mass was found with solid and fat components consistent with a teratoma.  No lymphadenopathy.  Small to moderate ascites and inflammatory changes were seen in the pelvis follow-up ultrasound was also performed showing concern for right ovarian torsion with a large ovarian mass cystic and solid components 9.7 cm.  A left ovarian cyst seen 3.4 cm.  Endometrial thickening with hypervascularity and endometrial thickness of 24 mm moderate pelvic free fluid.    04/07/2018 Surgery    Given the concern for torsion she was taken urgently to the operating room by Dr. Orson Ape. McLeod 04/07/2018.  At that time operative laparoscopy, torsion of the right adnexa was noted along with thickened abnormal endometrium.  She had right salpingo-oophorectomy through a laparoscopic approach along with endometrial biopsy.      04/07/2018 Pathology Results    Right ovary showed adenocarcinoma, endometrioid type Endometrial biopsy showed adenocarcinoma    04/23/2018 Tumor Marker    Patient's tumor was tested for the following markers: CA-125 Results of the tumor marker test revealed 73.2    04/24/2018 Surgery    Preoperative Diagnosis: At least Stage 3 Endometrial Cancer with metastases to right ovary    Postoperative Diagnosis:  At least Stage 3, possible Stage 4 Endometrial Cancer with metastases to right ovary and questionable parametrial and sigmoid peritoneum  Procedure(s) Performed: Pelvic washings, Lysis of adhesions. Biopsy of left parametrial tissue and sigmoid peritoneum  Surgeon: Bernita Raisin, MD  Specimens: pelvic washings, parametrial biopsy, sigmoid peritoneum biopsy  Complications: none  Indication for Procedure:  Patient found at OSH to have right ovarian mass that was resected and found on permanent section to be c/w endometrial cancer. Also had endometrial biopsy showing the same.  Operative Findings: Significant adhesive disease almost miliary-like. The sigmoid was adherent to the left cornua. After takedown of the sigmoid in this region it was clear the left cornua was involved in an abnormal process including necrotic tissue clinically concerning for malignancy. In addition the bladder was adherent to this region. Posterior surface of the broad ligament in area c/w the parametria on the left showed likely tumor breakthrough. Images were taken. Biopsy from the tissue extruding from the left side of the uterus in the presumed parametrium was sent for frozen. This returned worrisome for disease, but not definitive. A separate area of the sigmoid was adherent inferior to the posterior surface lesion and the remnant of this adhesion was sent as sigmoid peritoneum. This, on frozen, returned with fibrosis.     04/24/2018 Pathology Results    1. Peritoneum, biopsy - METASTATIC CARCINOMA CONSISTENT WITH PATIENT'S CLINICAL HISTORY OF PRIMARY ENDOMETRIAL CARCINOMA. SEE NOTE 2. Soft tissue, biopsy, parametrial - METASTATIC CARCINOMA CONSISTENT WITH PATIENT'S CLINICAL HISTORY OF PRIMARY ENDOMETRIAL  CARCINOMA. SEE NOTE Diagnosis Note 1. and 2. Immunohistochemical stains show that the tumor cells are positive for PAX8 and negative for calretinin, consistent with the above  diagnosis    04/25/2018 Cancer Staging    Staging form: Corpus Uteri - Carcinoma and Carcinosarcoma, AJCC 8th Edition - Clinical: Stage IVA (cT4, cN0, cM0) - Signed by Heath Lark, MD on 04/25/2018    05/02/2018 Tumor Marker    Patient's tumor was tested for the following markers: CA-125 Results of the tumor marker test revealed 56.7    05/04/2018 -  Chemotherapy    The patient had carboplatin and Taxol    05/08/2018 Procedure    Placement of a power injectable Port-A-Cath    05/29/2018 Tumor Marker    Patient's tumor was tested for the following markers: CA-125 Results of the tumor marker test revealed 31.6    06/19/2018 Tumor Marker    Patient's tumor was tested for the following markers: CA-125 Results of the tumor marker test revealed 21.1    06/28/2018 Imaging    No evidence of malignancy or other acute findings within the abdomen or pelvis.  Moderate hepatic steatosis.    07/10/2018 Tumor Marker    Patient's tumor was tested for the following markers: CA-125 Results of the tumor marker test revealed 12.6      HISTORY OF PRESENT ILLNESS::Kathryn Stewart is a 38 y.o. female who is presenting to the office today for evaluation of endometrial cancer. She is accompanied by her younger sister and her husband Delfino Lovett. She lives near [place]. Initially presented for ovarian torsion which is how the cancer was discovered. She has completed chemotherapy and is here to discuss radiation treatment. Her case will be presented at gynecological cancer conference next week. She reports her mother having ovarian cancer, however she reports her genetic testing was negative. She has had three surgeries; the last surgery was a robotic-assisted laparoscopic hysterectomy and left salpingo-oophorectomy. Final path was significant for finding of endometrioid adenocarcinoma within the endometrium, FIGO grade 1, scattered microscopic foci. The tumor did extend into the inner half of the myometrium and there was  metastatic spread to the left ovary. Path report was significant for finding of LVSI.  On the patient's surgery, dated April 24, 2018, pt had lysis of adhesions and bx of left parametrial tissue. Pathology from this surgery showed metastatic carcinoma within the left parametrial tissues  Radiation therapy has been called in for consult of brachytherapy as part of her overall management.   MRI with and without contrast was performed on September 03, 2018 which showed Improving endometrial mass, now measuring 7 mm in thickness, corresponding to the patient's known endometrial cancer.  She underwent biopsies on September 20, 2018 which revealed 1. Soft tissue, biopsy, right broad ligament nodule - benign fibrous tissue with foreign body giant cell reaction. No evidence of malignancy. 2. Soft tissue, biopsy, uterine serosal nodule - benign fibrous tissue with foreign body giant cell reaction, no evidence of malignancy. 3. Uterus and cervix, left tube and ovary - endometrium: endometrioid adenocarcinoma, FIGO grade 1/3, scattered microscopic foci. Adenocarcinoma involved the inner half of the myometrium. Myometrium: leiomyoma. Serosa: adhesions. Right fallopian tube was unremarkable. Left adnexa: endometrioid adenocarcinoma involving the ovary. Benign fallopian tube.  she reports associated headache beginning today. she denies leg swelling any other symptoms.    PREVIOUS RADIATION THERAPY: No  PAST MEDICAL HISTORY:  has a past medical history of Arthritis, Asthma, Diabetes mellitus without complication ([place]), GERD (gastroesophageal reflux disease), Hypertension, and  PONV (postoperative nausea and vomiting).    PAST SURGICAL HISTORY: Past Surgical History:  Procedure Laterality Date  . IR IMAGING GUIDED PORT INSERTION  05/08/2018  . LAPAROSCOPIC SALPINGOOPHERECTOMY Right 2019   torsion  . ROBOTIC ASSISTED TOTAL HYSTERECTOMY WITH BILATERAL SALPINGO OOPHERECTOMY N/A 04/24/2018   Procedure: XI ROBOTIC   DIAGNOSTIC LAPAROSCOPY WITH BIOPSY.;- DID NOT HAVE THIS SURGERY!  . ROBOTIC ASSISTED TOTAL HYSTERECTOMY WITH BILATERAL SALPINGO OOPHERECTOMY N/A 09/20/2018   Procedure: XI ROBOTIC ASSISTED TOTAL HYSTERECTOMY WITH LEFT SALPINGO OOPHORECTOMY;  Surgeon: [name], MD;  Location: [facility];  Service: Gynecology;  Laterality: N/A;    FAMILY HISTORY: family history includes Breast cancer (age of onset: 14) in her maternal aunt; Cervical cancer in her mother; Diabetes in her brother and mother; Epilepsy in her paternal aunt; Heart attack in her father; Kidney cancer (age of onset: 57) in her brother; Ovarian cancer (age of onset: 33) in her maternal aunt; Ovarian cancer (age of onset: 7) in her cousin; Throat cancer (age of onset: 33) in her mother; Uterine cancer (age of onset: 8) in her maternal aunt.  SOCIAL HISTORY:  reports that she has never smoked. She has never used smokeless tobacco. She reports that she does not drink alcohol or use drugs.  ALLERGIES: Latex and Other  MEDICATIONS:  Current Outpatient Medications  Medication Sig Dispense Refill  . fenofibrate (TRICOR) 48 MG tablet Take 48 mg by mouth daily.    . fluticasone (FLONASE) 50 MCG/ACT nasal spray 2 (two) times daily.    . INVOKANA 100 MG TABS tablet     . LANTUS SOLOSTAR 100 UNIT/ML Solostar Pen     . lidocaine-prilocaine (EMLA) cream Apply 1 application topically as needed. 30 g 6  . lisinopril (PRINIVIL,ZESTRIL) 10 MG tablet Take 10 mg by mouth at bedtime.     . metFORMIN (GLUCOPHAGE) 500 MG tablet Take 2 tablets (1,000 mg total) by mouth 2 (two) times daily with a meal. At lunch and at bedtime 120 tablet 1  . ondansetron (ZOFRAN) 8 MG tablet Take 1 tablet (8 mg total) by mouth every 8 (eight) hours as needed for nausea. 30 tablet 3  . phenylephrine (SUDAFED PE) 10 MG TABS tablet Take 10 mg by mouth 2 (two) times daily.    . prochlorperazine (COMPAZINE) 10 MG tablet TAKE 1 TABLET BY MOUTH EVERY 6 HOURS AS NEEDED FOR NAUSEA  FOR VOMITING (Patient taking differently: Take 10 mg by mouth every 6 (six) hours as needed for nausea or vomiting. TAKE 1 TABLET BY MOUTH EVERY 6 HOURS AS NEEDED FOR NAUSEA FOR VOMITING) 30 tablet 1  . docusate sodium (COLACE) 100 MG capsule Take 1 capsule (100 mg total) by mouth 2 (two) times daily. (Patient not taking: Reported on 10/24/2018) 10 capsule 0  . traMADol (ULTRAM) 50 MG tablet Take 1 tablet (50 mg total) by mouth every 6 (six) hours as needed for moderate pain or severe pain. Do not take and drive (Patient not taking: Reported on 10/24/2018) 15 tablet 0   No current facility-administered medications for this encounter.     REVIEW OF SYSTEMS:  A 10+ POINT REVIEW OF SYSTEMS WAS OBTAINED including neurology, dermatology, psychiatry, cardiac, respiratory, lymph, extremities, GI, GU, musculoskeletal, constitutional, reproductive, HEENT. All pertinent positives are noted in the HPI. All others are negative.    PHYSICAL EXAM:  height is 5' 1.5" (1.562 m) and weight is 175 lb 2 oz (79.4 kg). Her oral temperature is 98.2 F (36.8 C). Her blood pressure  is 123/91 (abnormal) and her pulse is 100. Her respiration is 18 and oxygen saturation is 97%.   General: Alert and oriented, in no acute distress HEENT: Head is normocephalic. Extraocular movements are intact. Oropharynx is clear. Neck: Neck is supple, no palpable cervical or supraclavicular lymphadenopathy. Heart: Regular in rate and rhythm with no murmurs, rubs, or gallops. Chest: Clear to auscultation bilaterally, with no rhonchi, wheezes, or rales. Abdomen: Soft, nontender, nondistended, with no rigidity or guarding. Extremities: No cyanosis or edema. Lymphatics: see Neck Exam Skin: No concerning lesions. Musculoskeletal: symmetric strength and muscle tone throughout. Neurologic: Cranial nerves II through XII are grossly intact. No obvious focalities. Speech is fluent. Coordination is intact. Psychiatric: Judgment and insight are  intact. Affect is appropriate. Pelvic exam deferred in light of exam next week by gynecologic oncology.   ECOG = 1  0 - Asymptomatic (Fully active, able to carry on all predisease activities without restriction)  1 - Symptomatic but completely ambulatory (Restricted in physically strenuous activity but ambulatory and able to carry out work of a light or sedentary nature. For example, light housework, office work)  2 - Symptomatic, <50% in bed during the day (Ambulatory and capable of all self care but unable to carry out any work activities. Up and about more than 50% of waking hours)  3 - Symptomatic, >50% in bed, but not bedbound (Capable of only limited self-care, confined to bed or chair 50% or more of waking hours)  4 - Bedbound (Completely disabled. Cannot carry on any self-care. Totally confined to bed or chair)  5 - Death   Eustace Pen MM, Creech RH, Tormey DC, et al. [phone]). "Toxicity and response criteria of the [facility]". [place] Oncol. 5 (6): 649-55  LABORATORY DATA:  Lab Results  Component Value Date   WBC 6.5 09/25/2018   HGB 10.0 (L) 09/25/2018   HCT 30.8 (L) 09/25/2018   MCV 103.7 (H) 09/25/2018   PLT 77 (L) 09/25/2018   NEUTROABS 3.7 09/25/2018   Lab Results  Component Value Date   NA 140 09/21/2018   K 4.5 09/21/2018   CL 106 09/21/2018   CO2 25 09/21/2018   GLUCOSE 145 (H) 09/21/2018   CREATININE 0.90 09/21/2018   CALCIUM 8.9 09/21/2018      RADIOGRAPHY: No results found.    IMPRESSION: Clinical: Stage IVA (cT4, cN0, cM0) endometrial cancer  Radiation therapy has been consulted for consideration of brachytherapy in her overall management. Pathology from her last surgery did show findings of LVSI which is one of the pathologic findings associated with vaginal cuff recurrence. In her second surgery, she was found to have tumor extending into the left parametria on biopsy specimen.   Today, I talked to the patient and family  about the findings and work-up thus far.  We discussed the natural history of her disease and general treatment, highlighting the role of vaginal brachytherapy in the management.  We discussed the available radiation techniques, and focused on the details of logistics and delivery.  We reviewed the anticipated acute and late sequelae associated with radiation in this setting.  The patient was encouraged to ask questions that I answered to the best of my ability.   PLAN: Given the patient's somewhat unusual presentation and stage, her case will be presented at the multidisciplinary gynecological conference next week for further input and recommendations as it related to vaginal brachytherapy.   ------------------------------------------------  Blair Promise, PhD, MD      This  document serves as a record of services personally performed by Gery Pray, MD. It was created on his behalf by Mary-Margaret Loma Messing, a trained medical scribe. The creation of this record is based on the scribe's personal observations and the provider's statements to them. This document has been checked and approved by the attending provider.

## 2018-10-24 NOTE — Progress Notes (Signed)
GYN Location of Tumor / Histology: Stage 4B Grade 1 Endometrioid Adenocarcinoma suspect endometrial primary (extension to right ovary, through left uterine cornua fusing to pelvic sigmoid peritoneum. Possible parametrial involvement)  Kathryn Stewart presented with symptoms of: She was in her usual state of health until the evening of 04/06/18 at which time she woke up with significant right lower quadrant pain.  She presented to the emergency room after the pain woke her up CT imaging was performed 04/07/2018 [facility] rocking him.  A 6.5 x 7 x 8.1 complex right adnexal mass was found with solid and fat components consistent with a teratoma.  No lymphadenopathy.  Small to moderate ascites and inflammatory changes were seen in the pelvis follow-up ultrasound was also performed showing concern for right ovarian torsion with a large ovarian mass cystic and solid components 9.7 cm.  A left ovarian cyst seen 3.4 cm.  Endometrial thickening with hypervascularity and endometrial thickness of 24 mm moderate pelvic free fluid.  Given the concern for torsion she was taken urgently to the operating room by Dr. Orson Ape. McLeod 04/07/2018.  At that time operative laparoscopy with right salpingooophorectomy and findings consistent with torsion was performed.  In addition endometrial biopsy was performed.  Unfortunately final pathology reveals in the right ovary and endometrioid type adenocarcinoma the endometrial biopsy showed endometrioid type adenocarcinoma.  Comments include a favoring of endometrium as the primary.  Biopsies revealed: 09/10/18:  1. Soft tissue, biopsy, right broad ligament nodule - BENIGN FIBROUS TISSUE WITH FOREIGN BODY GIANT CELL REACTION. - THERE IS NO EVIDENCE OF MALIGNANCY. 2. Soft tissue, biopsy, uterine serosal nodule - BENIGN FIBROUS TISSUE WITH FOREIGN BODY GIANT CELL REACTION. - THERE IS NO EVIDENCE OF MALIGNANCY. 3. Uterus and cervix, left tube and ovary - ENDOMETRIUM: ENDOMETRIOID ADENOCARCINOMA,  FIGO GRADE I/III, SCATTERED MICROSCOPIC FOCI. - ADENOCARCINOMA INVOLVES THE INNER HALF OF THE MYOMETRIUM. - MYOMETRIUM: LEIOMYOMA. - SEROSA: ADHESIONS. - RIGHT FALLOPIAN TUBE: UNREMARKABLE. - LEFT ADNEXA: ENDOMETRIOID ADENOCARCINOMA INVOLVING THE OVARY. - BENIGN FALLOPIAN TUBE. - SEE ONCOLOGY TABLE BELOW. Microscopic Comment 3. UTERUS, CARCINOMA OR CARCINOSARCOMA Procedure: Hysterectomy, bilateral fallopian tube resection, and left ovarian oophorectomy. Histologic type: Endometrioid adenocarcinoma. Histologic Grade: FIGO Grade I Myometrial invasion: Depth of invasion: 2 mm Myometrial thickness: 15 mm Uterine Serosa Involvement: Not identified Cervical stromal involvement: Not identified Extent of involvement of other organs: Involves the left ovary Lymphovascular invasion: Present Regional Lymph Nodes: None examined MMR testing by IHC will be performed on block 3-N Pathologic Stage Classification (pTNM, AJCC 8th edition): pT3, pNX FIGO Stage: At least FIGO Stage MRM was negative  Past/Anticipated interventions by Gyn/Onc surgery, if any: 09/20/18: Procedure(s) Performed:  1. Robotic-assisted laparoscopic hysterectomy, left salpingoophorectomy 2. Lysis of adhesions ~40 minutes  Surgeon: Bernita Raisin, MD  Past/Anticipated interventions by medical oncology, if any: 05/04/18 - 08/28/18 Carbo/Taxol x 6 cycles (had one week hold C6 due to thrombo)  Weight changes, if any: She denies excess weight loss and she states she has a normal appetite.  Bowel/Bladder complaints, if any: denied any changes in her bowel habits or urinary habits  Nausea/Vomiting, if any: denied nausea , vomiting, denied recent bloating   Pain issues, if any:  Pt reports headache, rated 4/10.  SAFETY ISSUES:  Prior radiation? No  Pacemaker/ICD? No  Possible current pregnancy? No  Is the patient on methotrexate? No  Current Complaints / other details:  Pt presents today for initial consult with Dr.  Sondra Come for Radiation Oncology. Pt is accompanied by husband and  sister.    She does admit to a long history of abnormal menses.  She states that she started her menstrual cycle at age 38.  There is documentation on the chart of possible PCOS.  Periods are described as both irregular and heavy with episodes intermittently of amenorrhea.  BP (!) 123/91 (BP Location: Left Arm, Patient Position: Sitting)   Pulse 100   Temp 98.2 F (36.8 C) (Oral)   Resp 18   Ht 5' 1.5" (1.562 m)   Wt 175 lb 2 oz (79.4 kg)   SpO2 97%   BMI 32.55 kg/m   Wt Readings from Last 3 Encounters:  10/24/18 175 lb 2 oz (79.4 kg)  10/03/18 176 lb 8 oz (80.1 kg)  09/20/18 173 lb (78.5 kg)   Loma Sousa, RN BSN

## 2018-10-29 ENCOUNTER — Telehealth: Payer: Self-pay | Admitting: Hematology and Oncology

## 2018-10-29 NOTE — Telephone Encounter (Signed)
Scheduled appt per 2/24 sch message - pt is aware of appt date and time   

## 2018-10-30 ENCOUNTER — Other Ambulatory Visit: Payer: Self-pay | Admitting: Oncology

## 2018-10-30 NOTE — Progress Notes (Signed)
Gynecologic Oncology Multi-Disciplinary Disposition Conference Note  Date of the Conference: 10/29/2018  Patient Name: Kathryn Stewart  Referring Provider: Dr. Rubbie Battiest Primary GYN Oncologist:   Stage/Disposition:  Stage 4B uterine cancer. Disposition is for possible vaginal brachytherapy. Dr. Sondra Come will discuss with the patient.    This Multidisciplinary conference took place involving physicians from [facility], [facility], Radiation Oncology, Pathology, Radiology along with the Gynecologic Oncology Nurse Practitioner and RN.  Comprehensive assessment of the patient's malignancy, staging, need for surgery, chemotherapy, radiation therapy, and need for further testing were reviewed. Supportive measures, both inpatient and following discharge were also discussed. The recommended plan of care is documented. Greater than 35 minutes were spent correlating and coordinating this patient's care.

## 2018-10-31 ENCOUNTER — Inpatient Hospital Stay (HOSPITAL_BASED_OUTPATIENT_CLINIC_OR_DEPARTMENT_OTHER): Payer: BLUE CROSS/BLUE SHIELD | Admitting: Gynecologic Oncology

## 2018-10-31 ENCOUNTER — Inpatient Hospital Stay: Payer: BLUE CROSS/BLUE SHIELD | Attending: Obstetrics

## 2018-10-31 ENCOUNTER — Inpatient Hospital Stay: Payer: BLUE CROSS/BLUE SHIELD

## 2018-10-31 ENCOUNTER — Encounter: Payer: Self-pay | Admitting: Gynecologic Oncology

## 2018-10-31 VITALS — BP 117/86 | HR 96 | Temp 98.4°F | Resp 20 | Ht 61.5 in | Wt 177.6 lb

## 2018-10-31 DIAGNOSIS — Z90722 Acquired absence of ovaries, bilateral: Secondary | ICD-10-CM | POA: Insufficient documentation

## 2018-10-31 DIAGNOSIS — C541 Malignant neoplasm of endometrium: Secondary | ICD-10-CM | POA: Diagnosis present

## 2018-10-31 DIAGNOSIS — Z90710 Acquired absence of both cervix and uterus: Secondary | ICD-10-CM

## 2018-10-31 LAB — CBC WITH DIFFERENTIAL/PLATELET
ABS IMMATURE GRANULOCYTES: 0.06 10*3/uL (ref 0.00–0.07)
Basophils Absolute: 0 10*3/uL (ref 0.0–0.1)
Basophils Relative: 0 %
Eosinophils Absolute: 0 10*3/uL (ref 0.0–0.5)
Eosinophils Relative: 1 %
HCT: 32.5 % — ABNORMAL LOW (ref 36.0–46.0)
HEMOGLOBIN: 10.7 g/dL — AB (ref 12.0–15.0)
IMMATURE GRANULOCYTES: 1 %
LYMPHS PCT: 35 %
Lymphs Abs: 1.9 10*3/uL (ref 0.7–4.0)
MCH: 35 pg — ABNORMAL HIGH (ref 26.0–34.0)
MCHC: 32.9 g/dL (ref 30.0–36.0)
MCV: 106.2 fL — ABNORMAL HIGH (ref 80.0–100.0)
Monocytes Absolute: 0.4 10*3/uL (ref 0.1–1.0)
Monocytes Relative: 8 %
NEUTROS ABS: 3 10*3/uL (ref 1.7–7.7)
Neutrophils Relative %: 55 %
Platelets: 140 10*3/uL — ABNORMAL LOW (ref 150–400)
RBC: 3.06 MIL/uL — ABNORMAL LOW (ref 3.87–5.11)
RDW: 16.3 % — ABNORMAL HIGH (ref 11.5–15.5)
WBC: 5.4 10*3/uL (ref 4.0–10.5)
nRBC: 0.4 % — ABNORMAL HIGH (ref 0.0–0.2)

## 2018-10-31 LAB — COMPREHENSIVE METABOLIC PANEL
ALT: 28 U/L (ref 0–44)
AST: 26 U/L (ref 15–41)
Albumin: 4 g/dL (ref 3.5–5.0)
Alkaline Phosphatase: 51 U/L (ref 38–126)
Anion gap: 11 (ref 5–15)
BUN: 19 mg/dL (ref 6–20)
CO2: 26 mmol/L (ref 22–32)
Calcium: 9.7 mg/dL (ref 8.9–10.3)
Chloride: 105 mmol/L (ref 98–111)
Creatinine, Ser: 0.77 mg/dL (ref 0.44–1.00)
GFR calc Af Amer: >60 mL/min (ref >60)
GFR calc non Af Amer: >60 mL/min (ref >60)
GLUCOSE: 137 mg/dL — AB (ref 70–99)
POTASSIUM: 4.3 mmol/L (ref 3.5–5.1)
SODIUM: 142 mmol/L (ref 135–145)
TOTAL PROTEIN: 7 g/dL (ref 6.5–8.1)
Total Bilirubin: 0.4 mg/dL (ref 0.3–1.2)

## 2018-10-31 MED ORDER — SODIUM CHLORIDE 0.9% FLUSH
10.0000 mL | Freq: Once | INTRAVENOUS | Status: AC
  Administered 2018-10-31: 10 mL
  Filled 2018-10-31: qty 10

## 2018-10-31 MED ORDER — HEPARIN SOD (PORK) LOCK FLUSH 100 UNIT/ML IV SOLN
500.0000 [IU] | Freq: Once | INTRAVENOUS | Status: AC
  Administered 2018-10-31: 500 [IU]
  Filled 2018-10-31: qty 5

## 2018-10-31 NOTE — Patient Instructions (Addendum)
No intercourse for full 12 weeks from surgery (After April 9).  Plan to follow up as scheduled with Dr. Alvy Bimler and contact Dr. Sondra Come once you have decided if you would like to proceed with vaginal brachytherapy.  Plan to follow up with GYN Oncology three months after seeing Dr. Alvy Bimler or sooner if you develop vaginal bleeding or other symptoms. You are cleared to resume your normal activities.

## 2018-10-31 NOTE — Progress Notes (Signed)
Gynecologic Oncology  S: Patient presents today for post-operative follow up.  She reports doing well since surgery.  She states she has obtained a new primary care who seems to be very thorough and her last [facility] check was under 7 which she is very happy about.  She was also recently started on cholesterol medication. Bowels and bladder functioning without difficulty.  Appetite good.  No abdominal pain reported.  No vaginal spotting. No fever, chills. No concerns voiced about her incisions.  Lab work from today discussed.  PLT count increasing.  No concerns voiced.  She is not due to go back to work until April and she does heavy lifting throughout the day at her job.   O: Today's Vitals   10/31/18 1117 10/31/18 1119  BP: 117/86   Pulse: 96   Resp: 20   Temp:  98.4 F (36.9 C)  TempSrc: Oral Oral  SpO2: 100%   Weight: 177 lb 9.6 oz (80.6 kg)   Height: 5' 1.5" (1.562 m)   PainSc: 0-No pain    Body mass index is 33.01 kg/m.   Alert, oriented x3. In no acute distress. Lungs clear. Heart regular rate and rhythm Abdomen soft, obese, active bowel sounds. Lap sites well healed with no erythema or drainage. Genitourinary: Urethra without lesions.  Vulva and vagina with no lesions or bleeding noted. Vaginal cuff intact with no suture material present. Bimanual confirms intact, smooth cuff. No masses or fullness appreciated. Extrem: No lower extrem edema or cyanosis.    A: Stage IVB grade 1 endometrioid adenocarcinoma s/p 6 cycles of carboplatin and taxol followed by robotic total laparoscopic hysterectomy with left salpingo-oophorectomy, on 09/20/18 with Dr. Precious Haws. Final pathology resulted: 1. Soft tissue, biopsy, right broad ligament nodule - BENIGN FIBROUS TISSUE WITH FOREIGN BODY GIANT CELL REACTION. - THERE IS NO EVIDENCE OF MALIGNANCY. 2. Soft tissue, biopsy, uterine serosal nodule - BENIGN FIBROUS TISSUE WITH FOREIGN BODY GIANT CELL REACTION. - THERE IS NO EVIDENCE OF  MALIGNANCY. 3. Uterus and cervix, left tube and ovary - ENDOMETRIUM: ENDOMETRIOID ADENOCARCINOMA, FIGO GRADE I/III, SCATTERED MICROSCOPIC FOCI. - ADENOCARCINOMA INVOLVES THE INNER HALF OF THE MYOMETRIUM. - MYOMETRIUM: LEIOMYOMA. - SEROSA: ADHESIONS. - RIGHT FALLOPIAN TUBE: UNREMARKABLE. - LEFT ADNEXA: ENDOMETRIOID ADENOCARCINOMA INVOLVING THE OVARY. - BENIGN FALLOPIAN TUBE. - SEE ONCOLOGY TABLE BELOW.  P:  She is doing well post-operatively.  She is advised of the restriction of no intercourse for the full 12 weeks from surgery.  She is advised to continue to avoid heavy lifting. If she decides to proceed with vaginal brachytherapy, she will contact Dr. Clabe Seal office.  If not, she is advised to follow up with Dr. Alvy Bimler in three months as scheduled and then see a GYN Oncologist three months after or sooner if needed.  She is advised to call for any questions or concerns.

## 2018-11-01 ENCOUNTER — Telehealth: Payer: Self-pay

## 2018-11-01 LAB — CA 125: Cancer Antigen (CA) 125: 5.6 U/mL (ref 0.0–38.1)

## 2018-11-01 NOTE — Telephone Encounter (Signed)
-----   Message from Heath Lark, MD sent at 11/01/2018  7:56 AM EST ----- Regarding: labs are ok Pls let her know most her labs are good, near normal Remind her of appt in April

## 2018-11-01 NOTE — Telephone Encounter (Signed)
Called with below message. She verbalized understanding. 

## 2018-11-26 ENCOUNTER — Telehealth: Payer: Self-pay | Admitting: Oncology

## 2018-11-26 NOTE — Telephone Encounter (Signed)
Called Kathryn Stewart about vaginal brachytherapy treatments.  She said she has decided not to have them. Verbalized agreement and will forward to Dr. Sondra Come.

## 2018-12-13 ENCOUNTER — Inpatient Hospital Stay (HOSPITAL_BASED_OUTPATIENT_CLINIC_OR_DEPARTMENT_OTHER): Payer: BLUE CROSS/BLUE SHIELD | Admitting: Hematology and Oncology

## 2018-12-13 ENCOUNTER — Encounter: Payer: Self-pay | Admitting: Hematology and Oncology

## 2018-12-13 ENCOUNTER — Inpatient Hospital Stay: Payer: BLUE CROSS/BLUE SHIELD | Attending: Obstetrics

## 2018-12-13 ENCOUNTER — Other Ambulatory Visit: Payer: Self-pay

## 2018-12-13 ENCOUNTER — Inpatient Hospital Stay: Payer: BLUE CROSS/BLUE SHIELD

## 2018-12-13 ENCOUNTER — Telehealth: Payer: Self-pay | Admitting: Oncology

## 2018-12-13 DIAGNOSIS — C541 Malignant neoplasm of endometrium: Secondary | ICD-10-CM | POA: Diagnosis not present

## 2018-12-13 DIAGNOSIS — G620 Drug-induced polyneuropathy: Secondary | ICD-10-CM | POA: Insufficient documentation

## 2018-12-13 DIAGNOSIS — E119 Type 2 diabetes mellitus without complications: Secondary | ICD-10-CM

## 2018-12-13 DIAGNOSIS — T451X5A Adverse effect of antineoplastic and immunosuppressive drugs, initial encounter: Secondary | ICD-10-CM

## 2018-12-13 LAB — COMPREHENSIVE METABOLIC PANEL
ALT: 28 U/L (ref 0–44)
AST: 24 U/L (ref 15–41)
Albumin: 4 g/dL (ref 3.5–5.0)
Alkaline Phosphatase: 46 U/L (ref 38–126)
Anion gap: 13 (ref 5–15)
BUN: 28 mg/dL — ABNORMAL HIGH (ref 6–20)
CO2: 23 mmol/L (ref 22–32)
Calcium: 9.7 mg/dL (ref 8.9–10.3)
Chloride: 106 mmol/L (ref 98–111)
Creatinine, Ser: 1.23 mg/dL — ABNORMAL HIGH (ref 0.44–1.00)
GFR calc Af Amer: >60 mL/min (ref >60)
GFR calc non Af Amer: 55 mL/min — ABNORMAL LOW (ref >60)
Glucose, Bld: 179 mg/dL — ABNORMAL HIGH (ref 70–99)
Potassium: 4.8 mmol/L (ref 3.5–5.1)
Sodium: 142 mmol/L (ref 135–145)
Total Bilirubin: <0.2 mg/dL — ABNORMAL LOW (ref 0.3–1.2)
Total Protein: 7.5 g/dL (ref 6.5–8.1)

## 2018-12-13 LAB — CBC WITH DIFFERENTIAL/PLATELET
Abs Immature Granulocytes: 0.09 10*3/uL — ABNORMAL HIGH (ref 0.00–0.07)
Basophils Absolute: 0 10*3/uL (ref 0.0–0.1)
Basophils Relative: 1 %
Eosinophils Absolute: 0.1 10*3/uL (ref 0.0–0.5)
Eosinophils Relative: 2 %
HCT: 36.1 % (ref 36.0–46.0)
Hemoglobin: 11.3 g/dL — ABNORMAL LOW (ref 12.0–15.0)
Immature Granulocytes: 2 %
Lymphocytes Relative: 36 %
Lymphs Abs: 1.9 10*3/uL (ref 0.7–4.0)
MCH: 33.9 pg (ref 26.0–34.0)
MCHC: 31.3 g/dL (ref 30.0–36.0)
MCV: 108.4 fL — ABNORMAL HIGH (ref 80.0–100.0)
Monocytes Absolute: 0.4 10*3/uL (ref 0.1–1.0)
Monocytes Relative: 8 %
Neutro Abs: 2.8 10*3/uL (ref 1.7–7.7)
Neutrophils Relative %: 51 %
Platelets: 198 10*3/uL (ref 150–400)
RBC: 3.33 MIL/uL — ABNORMAL LOW (ref 3.87–5.11)
RDW: 13.6 % (ref 11.5–15.5)
WBC: 5.4 10*3/uL (ref 4.0–10.5)
nRBC: 0.4 % — ABNORMAL HIGH (ref 0.0–0.2)

## 2018-12-13 NOTE — Assessment & Plan Note (Signed)
I reviewed blood work with the patient Her examination is normal I recommend repeat CT scan to document complete response to therapy before she returns back to work full-time If CT scan is normal, she will follow-up with GYN surgeon in 3 months We discussed the risk and benefits of keeping her port patent with periodic port flushes She is undecided at this point

## 2018-12-13 NOTE — Telephone Encounter (Signed)
Called patient with appointment for CT scan on 12/17/18. Discussed that she would need to pick up contrast to drink before the scan (drink 1st at 12:30, second at 1:30) and not eat or drink anything 4 hours before scan.  She verbalized agreement.

## 2018-12-13 NOTE — Assessment & Plan Note (Signed)
She has mild residual peripheral neuropathy from prior treatment but it is not debilitating Observe only

## 2018-12-13 NOTE — Progress Notes (Signed)
[place] OFFICE PROGRESS NOTE  Patient Care Team: Cannon Kettle as PCP - General  ASSESSMENT & PLAN:  Endometrial cancer [facility]) I reviewed blood work with the patient Her examination is normal I recommend repeat CT scan to document complete response to therapy before she returns back to work full-time If CT scan is normal, she will follow-up with GYN surgeon in 3 months We discussed the risk and benefits of keeping her port patent with periodic port flushes She is undecided at this point  Diabetes mellitus without complication ([place]) Her recent blood glucose was satisfactory She will continue diabetes management through her primary care doctor  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 CT ABDOMEN PELVIS W CONTRAST    Standing Status:   Future    Standing Expiration Date:   12/13/2019    Order Specific Question:   If indicated for the ordered procedure, I authorize the administration of contrast media per Radiology protocol    Answer:   Yes    Order Specific Question:   Preferred imaging location?    Answer:   [facility]    Order Specific Question:   Radiology Contrast Protocol - do NOT remove file path    Answer:   [email or web address]    Order Specific Question:   Is patient pregnant?    Answer:   No    INTERVAL HISTORY: Please see below for problem oriented charting. She returns for further follow-up She has some mild visual disturbances since chemotherapy was completed and had new prescription glasses She has mild residual peripheral neuropathy from prior treatment She denies nausea, changes in bowel habits or vaginal bleeding She tolerated hot flashes well  SUMMARY OF ONCOLOGIC HISTORY: Oncology History   Genetics neg     Endometrial cancer ([place])    04/06/2018 Initial Diagnosis    She presented with severe abdominal pain    04/07/2018 Imaging    She presented to the emergency room after the pain woke her up CT imaging was performed 04/07/2018 [facility].  A 6.5 x 7 x 8.1 complex right adnexal mass was found with solid and fat components consistent with a teratoma.  No lymphadenopathy.  Small to moderate ascites and inflammatory changes were seen in the pelvis follow-up ultrasound was also performed showing concern for right ovarian torsion with a large ovarian mass cystic and solid components 9.7 cm.  A left ovarian cyst seen 3.4 cm.  Endometrial thickening with hypervascularity and endometrial thickness of 24 mm moderate pelvic free fluid.    04/07/2018 Surgery    Given the concern for torsion she was taken urgently to the operating room by Dr. Orson Ape. McLeod 04/07/2018.  At that time operative laparoscopy, torsion of the right adnexa was noted along with thickened abnormal endometrium.  She had right salpingo-oophorectomy through a laparoscopic approach along with endometrial biopsy.      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 Biopsy of left parametrial tissue and sigmoid peritoneum  Surgeon: Bernita Raisin, MD  Specimens: pelvic washings, parametrial biopsy, sigmoid peritoneum biopsy  Complications: none  Indication for Procedure:  Patient found at OSH to have right  ovarian mass that was resected and found on permanent section to be c/w endometrial cancer. Also had endometrial biopsy showing the same.  Operative Findings: Significant adhesive disease almost miliary-like. The sigmoid was adherent to the left cornua. After takedown of the sigmoid in this region it was clear the left cornua was involved in an abnormal process including necrotic tissue clinically concerning for malignancy. In addition the bladder was adherent to this region. Posterior surface of the broad ligament in area c/w the parametria on the left showed likely tumor breakthrough. Images were taken. Biopsy from the tissue extruding from the left side of the uterus in the presumed parametrium was sent for frozen. This returned worrisome for disease, but not definitive. A separate area of the sigmoid was adherent inferior to the posterior surface lesion and the remnant of this adhesion was sent as sigmoid peritoneum. This, on frozen, returned with fibrosis.     04/24/2018 Pathology Results    1. Peritoneum, biopsy - METASTATIC CARCINOMA CONSISTENT WITH PATIENT'S CLINICAL HISTORY OF PRIMARY ENDOMETRIAL CARCINOMA. SEE NOTE 2. Soft tissue, biopsy, parametrial - METASTATIC CARCINOMA CONSISTENT WITH PATIENT'S CLINICAL HISTORY OF PRIMARY ENDOMETRIAL CARCINOMA. SEE NOTE Diagnosis Note 1. and 2. Immunohistochemical stains show that the tumor cells are positive for PAX8 and negative for calretinin, consistent with the above diagnosis    04/25/2018 Cancer Staging    Staging form: Corpus Uteri - Carcinoma and Carcinosarcoma, AJCC 8th Edition - Clinical: Stage IVA (cT4, cN0, cM0) - Signed by Heath Lark, MD on 04/25/2018    05/02/2018 Tumor Marker    Patient's tumor was tested for the following markers: CA-125 Results of the tumor marker test revealed 56.7    05/04/2018 - 08/28/2018 Chemotherapy    The patient had carboplatin and Taxol    05/08/2018 Procedure    Placement of a power injectable  Port-A-Cath    05/29/2018 Tumor Marker    Patient's tumor was tested for the following markers: CA-125 Results of the tumor marker test revealed 31.6    06/19/2018 Tumor Marker    Patient's tumor was tested for the following markers: CA-125 Results of the tumor marker test revealed 21.1    06/28/2018 Imaging    No evidence of malignancy or other acute findings within the abdomen or pelvis.  Moderate hepatic steatosis.    07/10/2018 Tumor Marker    Patient's tumor was tested for the following markers: CA-125 Results of the tumor marker test revealed 12.6    09/20/2018 Pathology Results    1. Soft tissue, biopsy, right broad ligament nodule - BENIGN FIBROUS TISSUE WITH FOREIGN BODY GIANT CELL REACTION. - THERE IS NO EVIDENCE OF MALIGNANCY. 2. Soft tissue, biopsy, uterine serosal nodule - BENIGN FIBROUS TISSUE WITH FOREIGN BODY GIANT CELL REACTION. - THERE IS NO EVIDENCE OF MALIGNANCY. 3. Uterus and cervix, left tube and ovary - ENDOMETRIUM: ENDOMETRIOID ADENOCARCINOMA, FIGO GRADE I/III, SCATTERED MICROSCOPIC FOCI. - ADENOCARCINOMA INVOLVES THE INNER HALF OF THE MYOMETRIUM. - MYOMETRIUM: LEIOMYOMA. - SEROSA: ADHESIONS. - RIGHT FALLOPIAN TUBE: UNREMARKABLE. - LEFT ADNEXA: ENDOMETRIOID ADENOCARCINOMA INVOLVING THE OVARY. - BENIGN FALLOPIAN TUBE. - SEE ONCOLOGY TABLE BELOW. Microscopic Comment 3. UTERUS, CARCINOMA OR CARCINOSARCOMA Procedure: Hysterectomy,  bilateral fallopian tube resection, and left ovarian oophorectomy. Histologic type: Endometrioid adenocarcinoma. Histologic Grade: FIGO Grade I Myometrial invasion: Depth of invasion: 2 mm Myometrial thickness: 15 mm Uterine Serosa Involvement: Not identified Cervical stromal involvement: Not identified Extent of involvement of other organs: Involves the left ovary Lymphovascular invasion: Present Regional Lymph Nodes: None examined MMR testing by IHC will be performed on block 3-N Pathologic Stage Classification (pTNM, AJCC  8th edition): pT3, pNX FIGO Stage: At least FIGO Stage III-A    09/20/2018 Surgery    Procedure(s) Performed:  1. Robotic-assisted laparoscopic hysterectomy, left salpingoophorectomy 2. Lysis of adhesions ~40 minutes  Surgeon: Bernita Raisin, MD  Specimens: Uterus with attached cervix, left tube/ovary, right IP ligament  Operative Findings: Adhesive disease of colon to left sidewall/left adnexa (~30 min lysis of adhesions). Filmy adhesions of right colon to culdesac. Residual right IP ligament. Small areas of questionable peritoneal disease x 2 removed and sent for pathology. No other gross residual disease. There was adhesive disease between the bladder and the cervix/LUS.    10/31/2018 Tumor Marker    Patient's tumor was tested for the following markers: CA-125 Results of the tumor marker test revealed 5.6     REVIEW OF SYSTEMS:   Constitutional: Denies fevers, chills or abnormal weight loss Ears, nose, mouth, throat, and face: Denies mucositis or sore throat Respiratory: Denies cough, dyspnea or wheezes Cardiovascular: Denies palpitation, chest discomfort or lower extremity swelling Gastrointestinal:  Denies nausea, heartburn or change in bowel habits Skin: Denies abnormal skin rashes Neurological:Denies numbness, tingling or new weaknesses Behavioral/Psych: Mood is stable, no new changes  All other systems were reviewed with the patient and are negative.  I have reviewed the past medical history, past surgical history, social history and family history with the patient and they are unchanged from previous note.  ALLERGIES:  is allergic to latex and other.  MEDICATIONS:  Current Outpatient Medications  Medication Sig Dispense Refill  . cetirizine (ZYRTEC) 10 MG tablet Take 10 mg by mouth daily.    . fenofibrate (TRICOR) 48 MG tablet Take 48 mg by mouth daily.    . fluticasone (FLONASE) 50 MCG/ACT nasal spray 2 (two) times daily.    . INVOKANA 100 MG TABS tablet Take  100 mg by mouth daily.     Marland Kitchen LANTUS SOLOSTAR 100 UNIT/ML Solostar Pen Inject 38 Units into the skin at bedtime.     . lidocaine-prilocaine (EMLA) cream Apply 1 application topically as needed. 30 g 6  . lisinopril (PRINIVIL,ZESTRIL) 10 MG tablet Take 10 mg by mouth at bedtime.     . metFORMIN (GLUCOPHAGE) 500 MG tablet Take 2 tablets (1,000 mg total) by mouth 2 (two) times daily with a meal. At lunch and at bedtime 120 tablet 1  . ondansetron (ZOFRAN) 8 MG tablet Take 1 tablet (8 mg total) by mouth every 8 (eight) hours as needed for nausea. (Patient not taking: Reported on 10/31/2018) 30 tablet 3  . prochlorperazine (COMPAZINE) 10 MG tablet TAKE 1 TABLET BY MOUTH EVERY 6 HOURS AS NEEDED FOR NAUSEA FOR VOMITING (Patient not taking: Reported on 10/31/2018) 30 tablet 1   No current facility-administered medications for this visit.     PHYSICAL EXAMINATION: ECOG PERFORMANCE STATUS: 1 - Symptomatic but completely ambulatory  Vitals:   12/13/18 1005  BP: (!) 123/96  Pulse: 96  Resp: 20  Temp: 98.2 F (36.8 C)  SpO2: 100%   Filed Weights   12/13/18 1005  Weight: 183 lb 4.8 oz (83.1  kg)    GENERAL:alert, no distress and comfortable SKIN: skin color, texture, turgor are normal, no rashes or significant lesions EYES: normal, Conjunctiva are pink and non-injected, sclera clear OROPHARYNX:no exudate, no erythema and lips, buccal mucosa, and tongue normal  NECK: supple, thyroid normal size, non-tender, without nodularity LYMPH:  no palpable lymphadenopathy in the cervical, axillary or inguinal LUNGS: clear to auscultation and percussion with normal breathing effort HEART: regular rate & rhythm and no murmurs and no lower extremity edema ABDOMEN:abdomen soft, non-tender and normal bowel sounds Musculoskeletal:no cyanosis of digits and no clubbing  NEURO: alert & oriented x 3 with fluent speech, no focal motor/sensory deficits  LABORATORY DATA:  I have reviewed the data as listed     Component Value Date/Time   NA 142 10/31/2018 1044   K 4.3 10/31/2018 1044   CL 105 10/31/2018 1044   CO2 26 10/31/2018 1044   GLUCOSE 137 (H) 10/31/2018 1044   BUN 19 10/31/2018 1044   CREATININE 0.77 10/31/2018 1044   CALCIUM 9.7 10/31/2018 1044   PROT 7.0 10/31/2018 1044   ALBUMIN 4.0 10/31/2018 1044   AST 26 10/31/2018 1044   ALT 28 10/31/2018 1044   ALKPHOS 51 10/31/2018 1044   BILITOT 0.4 10/31/2018 1044   GFRNONAA >60 10/31/2018 1044   GFRAA >60 10/31/2018 1044    No results found for: SPEP, UPEP  Lab Results  Component Value Date   WBC 5.4 12/13/2018   NEUTROABS 2.8 12/13/2018   HGB 11.3 (L) 12/13/2018   HCT 36.1 12/13/2018   MCV 108.4 (H) 12/13/2018   PLT 198 12/13/2018      Chemistry      Component Value Date/Time   NA 142 10/31/2018 1044   K 4.3 10/31/2018 1044   CL 105 10/31/2018 1044   CO2 26 10/31/2018 1044   BUN 19 10/31/2018 1044   CREATININE 0.77 10/31/2018 1044      Component Value Date/Time   CALCIUM 9.7 10/31/2018 1044   ALKPHOS 51 10/31/2018 1044   AST 26 10/31/2018 1044   ALT 28 10/31/2018 1044   BILITOT 0.4 10/31/2018 1044      All questions were answered. The patient knows to call the clinic with any problems, questions or concerns. No barriers to learning was detected.  I spent 15 minutes counseling the patient face to face. The total time spent in the appointment was 20 minutes and more than 50% was on counseling and review of test results  Heath Lark, MD 12/13/2018 10:23 AM

## 2018-12-13 NOTE — Assessment & Plan Note (Signed)
Her recent blood glucose was satisfactory She will continue diabetes management through her primary care doctor

## 2018-12-17 ENCOUNTER — Encounter (HOSPITAL_COMMUNITY): Payer: Self-pay

## 2018-12-17 ENCOUNTER — Telehealth: Payer: Self-pay

## 2018-12-17 ENCOUNTER — Other Ambulatory Visit: Payer: Self-pay

## 2018-12-17 ENCOUNTER — Ambulatory Visit (HOSPITAL_COMMUNITY)
Admission: RE | Admit: 2018-12-17 | Discharge: 2018-12-17 | Disposition: A | Discharge status: Home / Self Care | Payer: BLUE CROSS/BLUE SHIELD | Source: Ambulatory Visit | Attending: Hematology and Oncology | Admitting: Hematology and Oncology

## 2018-12-17 DIAGNOSIS — C541 Malignant neoplasm of endometrium: Secondary | ICD-10-CM | POA: Insufficient documentation

## 2018-12-17 HISTORY — DX: Malignant (primary) neoplasm, unspecified: C80.1

## 2018-12-17 MED ORDER — HEPARIN SOD (PORK) LOCK FLUSH 100 UNIT/ML IV SOLN
500.0000 [IU] | Freq: Once | INTRAVENOUS | Status: AC
  Administered 2018-12-17: 500 [IU] via INTRAVENOUS

## 2018-12-17 MED ORDER — SODIUM CHLORIDE (PF) 0.9 % IJ SOLN
INTRAMUSCULAR | Status: AC
  Filled 2018-12-17: qty 50

## 2018-12-17 MED ORDER — IOHEXOL 300 MG/ML  SOLN
100.0000 mL | Freq: Once | INTRAMUSCULAR | Status: AC | PRN
  Administered 2018-12-17: 100 mL via INTRAVENOUS

## 2018-12-17 MED ORDER — HEPARIN SOD (PORK) LOCK FLUSH 100 UNIT/ML IV SOLN
INTRAVENOUS | Status: AC
  Filled 2018-12-17: qty 5

## 2018-12-17 NOTE — Telephone Encounter (Signed)
She called and left a message to call her.  Called back. She got a e-mail that she missed a port flush. She did not miss appt, she came and was told that flush kits are low and they did not flush port. She is checking on paper work to return to work. Per Dr. Alvy Bimler she will fill it out after today's scan. She ask that the office call her and she will come and pick it up tomorrow.

## 2018-12-18 ENCOUNTER — Telehealth: Payer: Self-pay | Admitting: Oncology

## 2018-12-18 ENCOUNTER — Other Ambulatory Visit: Payer: Self-pay | Admitting: Hematology and Oncology

## 2018-12-18 DIAGNOSIS — C541 Malignant neoplasm of endometrium: Secondary | ICD-10-CM

## 2018-12-18 NOTE — Telephone Encounter (Signed)
Called Brynnan and asked if she would like to have her port removed.  She said she would like to have it removed.  Advised her that we will call back with an appointment.

## 2018-12-18 NOTE — Telephone Encounter (Signed)
I placed the order Due to deferral of all routine procedure, it will be at least June

## 2018-12-20 ENCOUNTER — Telehealth: Payer: Self-pay | Admitting: Oncology

## 2018-12-20 NOTE — Telephone Encounter (Signed)
Called Kathryn Stewart with appointment have her port removed.  Advised her that it will be done at [facility] on 02/05/19 with arrival at 8:30 to admissions.  Gave her instructions for NPO after midnight, that she will need a driver, that she can take morning meds with sips of water.  Also discussed to hold morning diabetic medication.  She verbalized understanding and agreement.

## 2019-01-30 ENCOUNTER — Telehealth: Payer: Self-pay | Admitting: Oncology

## 2019-01-30 NOTE — Telephone Encounter (Signed)
Called Kathryn Stewart and confirmed appointment on 03/07/19 at 10:45 with Dr. Skeet Latch.  She verbalized understanding and agreement.

## 2019-02-04 ENCOUNTER — Other Ambulatory Visit: Payer: Self-pay | Admitting: Radiology

## 2019-02-04 ENCOUNTER — Encounter: Payer: Self-pay | Admitting: Hematology and Oncology

## 2019-02-05 ENCOUNTER — Other Ambulatory Visit: Payer: Self-pay | Admitting: Hematology and Oncology

## 2019-02-05 ENCOUNTER — Encounter (HOSPITAL_COMMUNITY): Payer: Self-pay

## 2019-02-05 ENCOUNTER — Ambulatory Visit (HOSPITAL_COMMUNITY)
Admission: RE | Admit: 2019-02-05 | Discharge: 2019-02-05 | Disposition: A | Discharge status: Home / Self Care | Payer: BC Managed Care – PPO | Source: Ambulatory Visit | Attending: Hematology and Oncology | Admitting: Hematology and Oncology

## 2019-02-05 ENCOUNTER — Other Ambulatory Visit: Payer: Self-pay

## 2019-02-05 DIAGNOSIS — I10 Essential (primary) hypertension: Secondary | ICD-10-CM | POA: Insufficient documentation

## 2019-02-05 DIAGNOSIS — C541 Malignant neoplasm of endometrium: Secondary | ICD-10-CM | POA: Diagnosis not present

## 2019-02-05 DIAGNOSIS — E119 Type 2 diabetes mellitus without complications: Secondary | ICD-10-CM | POA: Diagnosis not present

## 2019-02-05 DIAGNOSIS — Z79899 Other long term (current) drug therapy: Secondary | ICD-10-CM | POA: Insufficient documentation

## 2019-02-05 DIAGNOSIS — Z452 Encounter for adjustment and management of vascular access device: Secondary | ICD-10-CM | POA: Insufficient documentation

## 2019-02-05 DIAGNOSIS — Z794 Long term (current) use of insulin: Secondary | ICD-10-CM | POA: Diagnosis not present

## 2019-02-05 DIAGNOSIS — J45909 Unspecified asthma, uncomplicated: Secondary | ICD-10-CM | POA: Diagnosis not present

## 2019-02-05 DIAGNOSIS — K219 Gastro-esophageal reflux disease without esophagitis: Secondary | ICD-10-CM | POA: Insufficient documentation

## 2019-02-05 HISTORY — PX: IR REMOVAL TUN ACCESS W/ PORT W/O FL MOD SED: IMG2290

## 2019-02-05 LAB — CBC
HCT: 36.5 % (ref 36.0–46.0)
Hemoglobin: 11.7 g/dL — ABNORMAL LOW (ref 12.0–15.0)
MCH: 33 pg (ref 26.0–34.0)
MCHC: 32.1 g/dL (ref 30.0–36.0)
MCV: 102.8 fL — ABNORMAL HIGH (ref 80.0–100.0)
Platelets: 224 10*3/uL (ref 150–400)
RBC: 3.55 MIL/uL — ABNORMAL LOW (ref 3.87–5.11)
RDW: 13.1 % (ref 11.5–15.5)
WBC: 6.2 10*3/uL (ref 4.0–10.5)
nRBC: 0 % (ref 0.0–0.2)

## 2019-02-05 LAB — BASIC METABOLIC PANEL
Anion gap: 12 (ref 5–15)
BUN: 32 mg/dL — ABNORMAL HIGH (ref 6–20)
CO2: 26 mmol/L (ref 22–32)
Calcium: 9.6 mg/dL (ref 8.9–10.3)
Chloride: 105 mmol/L (ref 98–111)
Creatinine, Ser: 1.19 mg/dL — ABNORMAL HIGH (ref 0.44–1.00)
GFR calc Af Amer: >60 mL/min (ref >60)
GFR calc non Af Amer: 57 mL/min — ABNORMAL LOW (ref >60)
Glucose, Bld: 142 mg/dL — ABNORMAL HIGH (ref 70–99)
Potassium: 4 mmol/L (ref 3.5–5.1)
Sodium: 143 mmol/L (ref 135–145)

## 2019-02-05 LAB — PROTIME-INR
INR: 1 (ref 0.8–1.2)
Prothrombin Time: 13.3 seconds (ref 11.4–15.2)

## 2019-02-05 LAB — GLUCOSE, CAPILLARY: Glucose-Capillary: 111 mg/dL — ABNORMAL HIGH (ref 70–99)

## 2019-02-05 MED ORDER — SODIUM CHLORIDE 0.9 % IV SOLN: INTRAVENOUS | Status: DC

## 2019-02-05 MED ORDER — CEFAZOLIN SODIUM-DEXTROSE 2-4 GM/100ML-% IV SOLN
2.0000 g | INTRAVENOUS | Status: AC
  Administered 2019-02-05: 2 g via INTRAVENOUS

## 2019-02-05 MED ORDER — CHLORHEXIDINE GLUCONATE 4 % EX LIQD
CUTANEOUS | Status: AC
  Filled 2019-02-05: qty 15

## 2019-02-05 MED ORDER — MIDAZOLAM HCL 2 MG/2ML IJ SOLN
INTRAMUSCULAR | Status: AC | PRN
  Administered 2019-02-05: 1 mg via INTRAVENOUS

## 2019-02-05 MED ORDER — FENTANYL CITRATE (PF) 100 MCG/2ML IJ SOLN
INTRAMUSCULAR | Status: AC
  Filled 2019-02-05: qty 2

## 2019-02-05 MED ORDER — CEFAZOLIN SODIUM-DEXTROSE 2-4 GM/100ML-% IV SOLN
INTRAVENOUS | Status: AC
  Filled 2019-02-05: qty 100

## 2019-02-05 MED ORDER — LIDOCAINE-EPINEPHRINE (PF) 1 %-1:200000 IJ SOLN
INTRAMUSCULAR | Status: AC | PRN
  Administered 2019-02-05: 10 mL

## 2019-02-05 MED ORDER — FENTANYL CITRATE (PF) 100 MCG/2ML IJ SOLN
INTRAMUSCULAR | Status: AC | PRN
  Administered 2019-02-05: 50 ug via INTRAVENOUS

## 2019-02-05 MED ORDER — LIDOCAINE-EPINEPHRINE (PF) 1 %-1:200000 IJ SOLN
INTRAMUSCULAR | Status: AC
  Filled 2019-02-05: qty 30

## 2019-02-05 MED ORDER — LIDOCAINE HCL 1 % IJ SOLN
INTRAMUSCULAR | Status: AC
  Filled 2019-02-05: qty 20

## 2019-02-05 MED ORDER — MIDAZOLAM HCL 2 MG/2ML IJ SOLN
INTRAMUSCULAR | Status: AC
  Filled 2019-02-05: qty 2

## 2019-02-05 NOTE — Discharge Instructions (Signed)
Implanted Port Removal, Care After  This sheet gives you information about how to care for yourself after your procedure. Your health care provider may also give you more specific instructions. If you have problems or questions, contact your health care provider.  What can I expect after the procedure?  After the procedure, it is common to have:   Soreness or pain near your incision.   Some swelling or bruising near your incision.  Follow these instructions at home:  Medicines   Take over-the-counter and prescription medicines only as told by your health care provider.   If you were prescribed an antibiotic medicine, take it as told by your health care provider. Do not stop taking the antibiotic even if you start to feel better.  Bathing   Do not take baths, swim, or use a hot tub until your health care provider approves. Ask your health care provider if you can take showers. You may only be allowed to take sponge baths.  Incision care     Follow instructions from your health care provider about how to take care of your incision. Make sure you:  ? Wash your hands with soap and water before you change your bandage (dressing). If soap and water are not available, use hand sanitizer.  ? Change your dressing as told by your health care provider.  ? Keep your dressing dry.  ? Leave stitches (sutures), skin glue, or adhesive strips in place. These skin closures may need to stay in place for 2 weeks or longer. If adhesive strip edges start to loosen and curl up, you may trim the loose edges. Do not remove adhesive strips completely unless your health care provider tells you to do that.   Check your incision area every day for signs of infection. Check for:  ? More redness, swelling, or pain.  ? More fluid or blood.  ? Warmth.  ? Pus or a bad smell.  Driving     Do not drive for 24 hours if you were given a medicine to help you relax (sedative) during your procedure.   If you did not receive a sedative, ask  your health care provider when it is safe to drive.  Activity   Return to your normal activities as told by your health care provider. Ask your health care provider what activities are safe for you.   Do not lift anything that is heavier than 10 lb (4.5 kg), or the limit that you are told, until your health care provider says that it is safe.   Do not do activities that involve lifting your arms over your head.  General instructions   Do not use any products that contain nicotine or tobacco, such as cigarettes and e-cigarettes. These can delay healing. If you need help quitting, ask your health care provider.   Keep all follow-up visits as told by your health care provider. This is important.  Contact a health care provider if:   You have more redness, swelling, or pain around your incision.   You have more fluid or blood coming from your incision.   Your incision feels warm to the touch.   You have pus or a bad smell coming from your incision.   You have pain that is not relieved by your pain medicine.  Get help right away if you have:   A fever or chills.   Chest pain.   Difficulty breathing.  Summary   After the procedure, it is common to   have pain, soreness, swelling, or bruising near your incision.   If you were prescribed an antibiotic medicine, take it as told by your health care provider. Do not stop taking the antibiotic even if you start to feel better.   Do not drive for 24 hours if you were given a sedative during your procedure.   Return to your normal activities as told by your health care provider. Ask your health care provider what activities are safe for you.  This information is not intended to replace advice given to you by your health care provider. Make sure you discuss any questions you have with your health care provider.  Document Released: 08/03/2015 Document Revised: 10/05/2017 Document Reviewed: 10/05/2017  [employer]vier Interactive Patient Education  2019 [employer]

## 2019-02-05 NOTE — H&P (Signed)
Chief Complaint: Patient was seen in consultation today for [facility] a cath removal at the request of [place]  Referring Physician(s): Heath Lark  Supervising Physician: Daryll Brod  Patient Status: [facility] - Out-pt  History of Present Illness: Kathryn Stewart is a 38 y.o. female   Endometrial cancer  Follows with Dr Alvy Bimler Radiation and Chemo Treatment complete  CT 12/17/18:  IMPRESSION: 1. No evidence of recurrent or metastatic carcinoma within the abdomen or pelvis. 2. Stable hepatic steatosis.  Request for PAC removal per Dr Alvy Bimler  Past Medical History:  Diagnosis Date  . #[id number] 04/2018  . Arthritis   . Asthma   . 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 GERD (gastroesophageal reflux disease)   . Hypertension   . PONV (postoperative nausea and vomiting)     Past Surgical History:  Procedure Laterality Date  . IR IMAGING GUIDED PORT INSERTION  05/08/2018  . LAPAROSCOPIC SALPINGOOPHERECTOMY Right 2019   torsion  . ROBOTIC ASSISTED TOTAL HYSTERECTOMY WITH BILATERAL SALPINGO OOPHERECTOMY N/A 04/24/2018   Procedure: XI ROBOTIC  DIAGNOSTIC LAPAROSCOPY WITH BIOPSY.;- DID NOT HAVE THIS SURGERY!  . ROBOTIC ASSISTED TOTAL HYSTERECTOMY WITH BILATERAL SALPINGO OOPHERECTOMY N/A 09/20/2018   Procedure: XI ROBOTIC ASSISTED TOTAL HYSTERECTOMY WITH LEFT SALPINGO OOPHORECTOMY;  Surgeon: [name], MD;  Location: [facility];  Service: Gynecology;  Laterality: N/A;    Allergies: Latex and Other  Medications: Prior to Admission medications   Medication Sig Start Date End Date Taking? Authorizing Provider  cetirizine (ZYRTEC) 10 MG tablet Take 10 mg by mouth daily.   Yes [provider]  fenofibrate (TRICOR) 48 MG tablet Take 48 mg by mouth daily. 10/19/18  Yes [provider]  fluticasone (FLONASE) 50 MCG/ACT nasal spray Place 1 spray into both nostrils daily as needed for allergies.  10/18/18  Yes [provider]   INVOKANA 100 MG TABS tablet Take 100 mg by mouth daily.  10/18/18  Yes [provider]  LANTUS SOLOSTAR 100 UNIT/ML Solostar Pen Inject 38 Units into the skin at bedtime.  10/18/18  Yes [provider]  lisinopril (PRINIVIL,ZESTRIL) 10 MG tablet Take 10 mg by mouth at bedtime.  03/24/18  Yes [provider]  metFORMIN (GLUCOPHAGE) 1000 MG tablet Take 1,000 mg by mouth 2 (two) times daily with a meal.   Yes [provider]     Family History  Problem Relation Age of Onset  . Diabetes Mother   . Cervical cancer Mother   . Throat cancer Mother 77  . Heart attack Father   . Diabetes Brother   . Ovarian cancer Maternal Aunt 21  . Uterine cancer Maternal Aunt 21  . Breast cancer Maternal Aunt 21  . Epilepsy Paternal Aunt   . Kidney cancer Brother 20  . Ovarian cancer Cousin 43    Social History   Socioeconomic History  . Marital status: Married    Spouse name: Richard  . Number of children: Not on file  . Years of education: Not on file  . Highest education level: Not on file  Occupational History  . Occupation: Freight forwarder  Social Needs  . Financial resource strain: Not on file  . Food insecurity:    Worry: Not on file    Inability: Not on file  . Transportation needs:    Medical: Not on file    Non-medical: Not on file  Tobacco Use  . Smoking status: Never Smoker  . Smokeless tobacco: Never Used  Substance  and Sexual Activity  . Alcohol use: No  . Drug use: No  . Sexual activity: Yes  Lifestyle  . Physical activity:    Days per week: Not on file    Minutes per session: Not on file  . Stress: Not on file  Relationships  . Social connections:    Talks on phone: Not on file    Gets together: Not on file    Attends religious service: Not on file    Active member of club or organization: Not on file    Attends meetings of clubs or organizations: Not on file    Relationship status: Not on file  Other Topics Concern  . Not on file   Social History Narrative  . Not on file    Review of Systems: A 12 point ROS discussed and pertinent positives are indicated in the HPI above.  All other systems are negative.  Review of Systems  Constitutional: Positive for fever. Negative for activity change and fatigue.  Respiratory: Negative for cough and shortness of breath.   Cardiovascular: Negative for chest pain.  Gastrointestinal: Negative for abdominal pain.  Musculoskeletal: Negative for back pain.  Neurological: Negative for weakness.  Psychiatric/Behavioral: Negative for behavioral problems and confusion.    Vital Signs: BP (!) 126/91   Pulse 95   Temp 98.1 F (36.7 C) (Oral)   Resp 18   Ht 5\' 1"  (1.549 m)   Wt 179 lb (81.2 kg)   LMP 04/07/2018 (Approximate)   SpO2 100%   BMI 33.82 kg/m   Physical Exam Cardiovascular:     Rate and Rhythm: Normal rate and regular rhythm.     Heart sounds: Normal heart sounds.  Pulmonary:     Breath sounds: Normal breath sounds.  Abdominal:     Palpations: Abdomen is soft.  Skin:    General: Skin is warm and dry.  Neurological:     Mental Status: She is alert and oriented to person, place, and time.  Psychiatric:        Mood and Affect: Mood normal.        Behavior: Behavior normal.        Thought Content: Thought content normal.        Judgment: Judgment normal.     Imaging: No results found.  Labs:  CBC: Recent Labs    09/25/18 1010 10/31/18 1044 12/13/18 0943 02/05/19 0821  WBC 6.5 5.4 5.4 6.2  HGB 10.0* 10.7* 11.3* 11.7*  HCT 30.8* 32.5* 36.1 36.5  PLT 77* 140* 198 224    COAGS: Recent Labs    05/08/18 0707 02/05/19 0821  INR 0.88 1.0    BMP: Recent Labs    09/21/18 0438 10/31/18 1044 12/13/18 0943 02/05/19 0821  NA 140 142 142 143  K 4.5 4.3 4.8 4.0  CL 106 105 106 105  CO2 25 26 23 26   GLUCOSE 145* 137* 179* 142*  BUN 21* 19 28* 32*  CALCIUM 8.9 9.7 9.7 9.6  CREATININE 0.90 0.77 1.23* 1.19*  GFRNONAA >60 >60 55* 57*  GFRAA  >60 >60 >60 >60    LIVER FUNCTION TESTS: Recent Labs    08/28/18 0920 09/17/18 1221 10/31/18 1044 12/13/18 0943  BILITOT 0.3 0.7 0.4 <0.2*  AST 11* 23 26 24   ALT 25 29 28 28   ALKPHOS 58 46 51 46  PROT 7.5 7.3 7.0 7.5  ALBUMIN 4.0 4.1 4.0 4.0    TUMOR MARKERS: No results for input(s): AFPTM, CEA, CA199, CHROMGRNA in  the last 8760 hours.  Assessment and Plan:  Endometrial cancer- treatment complete PAC removal per MD request Pt is aware of procedure benefits and risks including but not limited to Infection; bleeding; damage to surrounding structures Agreeable to proceed Consent signed in chart  Thank you for this interesting consult.  I greatly enjoyed meeting Keyuna Boyack and look forward to participating in their care.  A copy of this report was sent to the requesting provider on this date.  Electronically Signed: Lavonia Drafts, PA-C 02/05/2019, 9:18 AM   I spent a total of  30 Minutes   in face to face in clinical consultation, greater than 50% of which was counseling/coordinating care for [facility] removal

## 2019-02-05 NOTE — Procedures (Signed)
Endometrial ca  S/p RT IJ PORT REMOVAL  No comp Stable ebl min Full report in pacs

## 2019-02-05 NOTE — Progress Notes (Signed)
D/c instructions given to husband, Delfino Lovett, via telephone due to [place] restrictions.  All questions answered and Richard verbalized understanding of instructions

## 2019-03-06 NOTE — Progress Notes (Signed)
[place] at [facility]   Progress Note: Established Patient Follow-Up   Referring physician: Dr. Rubbie Battiest   CHIEF COMPLAINT:  Stage IVB endometroid endometrial adenocarcinoma. Staged 04/2018.   S/P chemotherapy 08/08/2018   HPI: [name]  is a very nice 38 y.o.  P0  Oncology History Overview Note  Genetics neg   Endometrial cancer ([place])  04/06/2018 Initial Diagnosis   She presented with severe abdominal pain   04/07/2018 Imaging   She presented to the emergency room after the pain woke her up CT imaging was performed 04/07/2018 [facility].  A 6.5 x 7 x 8.1 complex right adnexal mass was found with solid and fat components consistent with a teratoma.  No lymphadenopathy.  Small to moderate ascites and inflammatory changes were seen in the pelvis follow-up ultrasound was also performed showing concern for right ovarian torsion with a large ovarian mass cystic and solid components 9.7 cm.  A left ovarian cyst seen 3.4 cm.  Endometrial thickening with hypervascularity and endometrial thickness of 24 mm moderate pelvic free fluid.   04/07/2018 Surgery   Given the concern for torsion she was taken urgently to the operating room by Dr. Orson Ape. McLeod 04/07/2018.  At that time operative laparoscopy, torsion of the right adnexa was noted along with thickened abnormal endometrium.  She had right salpingo-oophorectomy through a laparoscopic approach along with endometrial biopsy.     04/07/2018 Pathology Results   Right ovary showed adenocarcinoma, endometrioid type Endometrial biopsy showed adenocarcinoma   04/23/2018 Tumor Marker   Patient's tumor was tested for the following markers: CA-125 Results of the tumor marker test revealed 73.2   04/24/2018 Surgery   Preoperative Diagnosis: At least Stage 3 Endometrial Cancer with metastases to right ovary   Postoperative Diagnosis:  At least Stage 3, possible Stage 4 Endometrial Cancer with metastases  to right ovary and questionable parametrial and sigmoid peritoneum  Procedure(s) Performed: Pelvic washings, Lysis of adhesions. Biopsy of left parametrial tissue and sigmoid peritoneum  Surgeon: Bernita Raisin, MD  Specimens: pelvic washings, parametrial biopsy, sigmoid peritoneum biopsy  Complications: none  Indication for Procedure:  Patient found at OSH to have right ovarian mass that was resected and found on permanent section to be c/w endometrial cancer. Also had endometrial biopsy showing the same.  Operative Findings: Significant adhesive disease almost miliary-like. The sigmoid was adherent to the left cornua. After takedown of the sigmoid in this region it was clear the left cornua was involved in an abnormal process including necrotic tissue clinically concerning for malignancy. In addition the bladder was adherent to this region. Posterior surface of the broad ligament in area c/w the parametria on the left showed likely tumor breakthrough. Images were taken. Biopsy from the tissue extruding from the left side of the uterus in the presumed parametrium was sent for frozen. This returned worrisome for disease, but not definitive. A separate area of the sigmoid was adherent inferior to the posterior surface lesion and the remnant of this adhesion was sent as sigmoid peritoneum. This, on frozen, returned with fibrosis.    04/24/2018 Pathology Results   1. Peritoneum, biopsy - METASTATIC CARCINOMA CONSISTENT WITH PATIENT'S CLINICAL HISTORY OF PRIMARY ENDOMETRIAL CARCINOMA. SEE NOTE 2. Soft tissue, biopsy, parametrial - METASTATIC CARCINOMA CONSISTENT WITH PATIENT'S CLINICAL HISTORY OF PRIMARY ENDOMETRIAL CARCINOMA. SEE NOTE Diagnosis Note 1. and 2. Immunohistochemical stains show that the tumor cells are positive for PAX8 and negative for calretinin, consistent with the above  diagnosis   04/25/2018 Cancer Staging   Staging form: Corpus Uteri - Carcinoma and Carcinosarcoma, AJCC  8th Edition - Clinical: Stage IVA (cT4, cN0, cM0) - Signed by Heath Lark, MD on 04/25/2018   05/02/2018 Tumor Marker   Patient's tumor was tested for the following markers: CA-125 Results of the tumor marker test revealed 56.7   05/04/2018 - 08/28/2018 Chemotherapy   The patient had carboplatin and Taxol   05/08/2018 Procedure   Placement of a power injectable Port-A-Cath   05/29/2018 Tumor Marker   Patient's tumor was tested for the following markers: CA-125 Results of the tumor marker test revealed 31.6   06/19/2018 Tumor Marker   Patient's tumor was tested for the following markers: CA-125 Results of the tumor marker test revealed 21.1   06/28/2018 Imaging   No evidence of malignancy or other acute findings within the abdomen or pelvis.  Moderate hepatic steatosis.   07/10/2018 Tumor Marker   Patient's tumor was tested for the following markers: CA-125 Results of the tumor marker test revealed 12.6   09/20/2018 Pathology Results   1. Soft tissue, biopsy, right broad ligament nodule - BENIGN FIBROUS TISSUE WITH FOREIGN BODY GIANT CELL REACTION. - THERE IS NO EVIDENCE OF MALIGNANCY. 2. Soft tissue, biopsy, uterine serosal nodule - BENIGN FIBROUS TISSUE WITH FOREIGN BODY GIANT CELL REACTION. - THERE IS NO EVIDENCE OF MALIGNANCY. 3. Uterus and cervix, left tube and ovary - ENDOMETRIUM: ENDOMETRIOID ADENOCARCINOMA, FIGO GRADE I/III, SCATTERED MICROSCOPIC FOCI. - ADENOCARCINOMA INVOLVES THE INNER HALF OF THE MYOMETRIUM. - MYOMETRIUM: LEIOMYOMA. - SEROSA: ADHESIONS. - RIGHT FALLOPIAN TUBE: UNREMARKABLE. - LEFT ADNEXA: ENDOMETRIOID ADENOCARCINOMA INVOLVING THE OVARY. - BENIGN FALLOPIAN TUBE. - SEE ONCOLOGY TABLE BELOW. Microscopic Comment 3. UTERUS, CARCINOMA OR CARCINOSARCOMA Procedure: Hysterectomy, bilateral fallopian tube resection, and left ovarian oophorectomy. Histologic type: Endometrioid adenocarcinoma. Histologic Grade: FIGO Grade I Myometrial invasion: Depth of  invasion: 2 mm Myometrial thickness: 15 mm Uterine Serosa Involvement: Not identified Cervical stromal involvement: Not identified Extent of involvement of other organs: Involves the left ovary Lymphovascular invasion: Present Regional Lymph Nodes: None examined MMR testing by IHC will be performed on block 3-N Pathologic Stage Classification (pTNM, AJCC 8th edition): pT3, pNX FIGO Stage: At least FIGO Stage III-A   09/20/2018 Surgery   Procedure(s) Performed:  1. Robotic-assisted laparoscopic hysterectomy, left salpingoophorectomy 2. Lysis of adhesions ~40 minutes  Surgeon: Bernita Raisin, MD  Specimens: Uterus with attached cervix, left tube/ovary, right IP ligament  Operative Findings: Adhesive disease of colon to left sidewall/left adnexa (~30 min lysis of adhesions). Filmy adhesions of right colon to culdesac. Residual right IP ligament. Small areas of questionable peritoneal disease x 2 removed and sent for pathology. No other gross residual disease. There was adhesive disease between the bladder and the cervix/LUS.   10/31/2018 Tumor Marker   Patient's tumor was tested for the following markers: CA-125 Results of the tumor marker test revealed 5.6   12/17/2018 Imaging   1. No evidence of recurrent or metastatic carcinoma within the abdomen or pelvis. 2. Stable hepatic steatosis.   02/05/2019 Procedure   Successful removal of implanted Port-A-Cath.       Interval History  She has treatment related alopecia. No complaints. Denies bleeding, denies pain.    Measurement of disease: CA125 potentially and clinical exam, likely will plan imaging for some portion of surveillance . Preop (however after RSO) 04/23/18 = 73.2 . 05/02/18 = 56.7 (prechemo)  . 10/31/18 5.6  Outpatient Encounter Medications as of 03/07/2019  Medication  Sig  . cetirizine (ZYRTEC) 10 MG tablet Take 10 mg by mouth daily.  . fenofibrate (TRICOR) 48 MG tablet Take 48 mg by mouth daily.  .  fluticasone (FLONASE) 50 MCG/ACT nasal spray Place 1 spray into both nostrils daily as needed for allergies.   . INVOKANA 100 MG TABS tablet Take 100 mg by mouth daily.   Marland Kitchen LANTUS SOLOSTAR 100 UNIT/ML Solostar Pen Inject 38 Units into the skin at bedtime.   Marland Kitchen lisinopril (PRINIVIL,ZESTRIL) 10 MG tablet Take 10 mg by mouth at bedtime.   . metFORMIN (GLUCOPHAGE) 1000 MG tablet Take 1,000 mg by mouth 2 (two) times daily with a meal.   No facility-administered encounter medications on file as of 03/07/2019.    Allergies  Allergen Reactions  . Latex Hives and Rash    Per DR. Gerarda Fraction. Question if this is a latex allergy versus Dermabond   . Other Rash    Dermabond - unsure allergy    Past Medical History:  Diagnosis Date  . #[id number] 04/2018  . Arthritis   . Asthma   . 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 GERD (gastroesophageal reflux disease)   . Hypertension   . PONV (postoperative nausea and vomiting)    Past Surgical History:  Procedure Laterality Date  . IR IMAGING GUIDED PORT INSERTION  05/08/2018  . IR REMOVAL TUN ACCESS W/ PORT W/O FL MOD SED  02/05/2019  . LAPAROSCOPIC SALPINGOOPHERECTOMY Right 2019   torsion  . ROBOTIC ASSISTED TOTAL HYSTERECTOMY WITH BILATERAL SALPINGO OOPHERECTOMY N/A 04/24/2018   Procedure: XI ROBOTIC  DIAGNOSTIC LAPAROSCOPY WITH BIOPSY.;- DID NOT HAVE THIS SURGERY!  . ROBOTIC ASSISTED TOTAL HYSTERECTOMY WITH BILATERAL SALPINGO OOPHERECTOMY N/A 09/20/2018   Procedure: XI ROBOTIC ASSISTED TOTAL HYSTERECTOMY WITH LEFT SALPINGO OOPHORECTOMY;  Surgeon: [name], MD;  Location: [facility];  Service: Gynecology;  Laterality: N/A;        Past Gynecological History:   GYNECOLOGIC HISTORY:  . Patient's last menstrual period was 04/07/2018 (approximate).  . Menarche: 38 years old . P 0 . Contraceptive none . HRT N/A  . Last Pap sure Family Hx:  Family History  Problem Relation Age of Onset  . Diabetes Mother   . Cervical cancer  Mother   . Throat cancer Mother 52  . Heart attack Father   . Diabetes Brother   . Ovarian cancer Maternal Aunt 21  . Uterine cancer Maternal Aunt 21  . Breast cancer Maternal Aunt 21  . Epilepsy Paternal Aunt   . Kidney cancer Brother 31  . Ovarian cancer Cousin 29   Social Hx:  Marland Kitchen Tobacco use: None . Alcohol use: None . Illicit Drug use: None . Illicit IV Drug use: Never    Review of Systems: Review of Systems  Constitutional: Negative.  Negative for appetite change, chills, diaphoresis, fatigue, fever and unexpected weight change.  HENT:  Negative.   Eyes: Negative.   Respiratory: Negative.   Cardiovascular: Negative.  Negative for chest pain, leg swelling and palpitations.  Gastrointestinal: Negative for abdominal distention, abdominal pain, blood in stool, constipation, diarrhea, nausea and rectal pain.  Endocrine: Negative.   Genitourinary: Negative.  Negative for bladder incontinence, difficulty urinating, dysuria, frequency, hematuria, nocturia, pelvic pain, vaginal bleeding and vaginal discharge.        No vaginal bleeding, vaginal dryness  Musculoskeletal: Negative.   Skin: Negative.  Negative for itching and rash.  Neurological: Negative.   Hematological: Negative.   Psychiatric/Behavioral: Negative.  All other systems reviewed and are negative.  Vitals: Blood pressure 121/87, pulse 98, temperature 98.7 F (37.1 C), temperature source Oral, resp. rate 20, height 5' 1.5" (1.562 m), weight 166 lb 12.8 oz (75.7 kg), SpO2 100 %. There were no vitals filed for this visit.  Vitals:   03/07/19 1032  BP: 117/83  Pulse: 89  Resp: 18  Temp: 97.7 F (36.5 C)  SpO2: 100%   Body mass index is 34.37 kg/m.   Physical Exam: General :  Well developed, 38 y.o., female in no apparent distress HEENT:  Normocephalic/atraumatic, symmetric, EOMI, eyelids normal Neck:   No visible masses.  Respiratory:  Respirations unlabored, no use of accessory muscles CV:    Deferred Breast:  Deferred Musculoskeletal: Normal muscle strength. Abdomen:  Trocar sites are CDI No visible masses or protrusion Extremities:  No visible edema or deformities Skin:   Normal inspection Neuro/Psych:  No focal motor deficit, no abnormal mental status. Normal gait. Normal affect. Alert and oriented to person, place, and time   Pelvic:  Nl EGBUS, atrophic vagina, no parametrial nodularity, No pelvic masses Rectal:  Good tone no masses   Assessment  Locally advanced (pelvic peritoneum) Endometrial cancer  Plan  1. We previously discussed referral to genetics given her young age. She has seen them and test results showed no pathogenic mutations 2. Management discussion ? She has completed neoadjuvant chemotherapy and surgery ? S/P referral to Rad onc for discussion about XRT given parametrial involvement but was  was advised against treatment at this time.   Return in three months  3.  Imaging  CT C/A/P 03/2019 as per the plan previously discussed.  Will extend interval assessment 4. Counseled about signs and symptoms of recurrence.    Cc: Albertina Parr, MD (Referring Ob/Gyn) Monico Blitz, MD (PCP)

## 2019-03-07 ENCOUNTER — Other Ambulatory Visit: Payer: Self-pay

## 2019-03-07 ENCOUNTER — Inpatient Hospital Stay: Payer: BC Managed Care – PPO | Attending: Obstetrics | Admitting: Gynecologic Oncology

## 2019-03-07 ENCOUNTER — Inpatient Hospital Stay: Payer: BC Managed Care – PPO

## 2019-03-07 VITALS — BP 117/83 | HR 89 | Temp 97.7°F | Resp 18 | Ht 61.0 in | Wt 181.9 lb

## 2019-03-07 DIAGNOSIS — C541 Malignant neoplasm of endometrium: Secondary | ICD-10-CM | POA: Diagnosis not present

## 2019-03-07 DIAGNOSIS — E119 Type 2 diabetes mellitus without complications: Secondary | ICD-10-CM

## 2019-03-07 DIAGNOSIS — Z9221 Personal history of antineoplastic chemotherapy: Secondary | ICD-10-CM | POA: Diagnosis not present

## 2019-03-07 DIAGNOSIS — C786 Secondary malignant neoplasm of retroperitoneum and peritoneum: Secondary | ICD-10-CM | POA: Insufficient documentation

## 2019-03-07 DIAGNOSIS — Z90710 Acquired absence of both cervix and uterus: Secondary | ICD-10-CM | POA: Diagnosis not present

## 2019-03-07 DIAGNOSIS — Z90722 Acquired absence of ovaries, bilateral: Secondary | ICD-10-CM | POA: Diagnosis not present

## 2019-03-07 LAB — BASIC METABOLIC PANEL
Anion gap: 12 (ref 5–15)
BUN: 23 mg/dL — ABNORMAL HIGH (ref 6–20)
CO2: 27 mmol/L (ref 22–32)
Calcium: 9.7 mg/dL (ref 8.9–10.3)
Chloride: 104 mmol/L (ref 98–111)
Creatinine, Ser: 1.03 mg/dL — ABNORMAL HIGH (ref 0.44–1.00)
GFR calc Af Amer: >60 mL/min (ref >60)
GFR calc non Af Amer: >60 mL/min (ref >60)
Glucose, Bld: 113 mg/dL — ABNORMAL HIGH (ref 70–99)
Potassium: 4.1 mmol/L (ref 3.5–5.1)
Sodium: 143 mmol/L (ref 135–145)

## 2019-03-07 NOTE — Patient Instructions (Signed)
F/U in 3 months  CT C/A/P 03/2019 CA125 BMET  Thank you very much Ms. Darl Seyler for allowing me to provide care for you today.  I appreciate your confidence in choosing our Gynecologic Oncology team.  If you have any questions about your visit today please call our office and we will get back to you as soon as possible.  Please consider using the website Medlineplus.gov as an Geneticist, molecular.   Francetta Found. Shanel Prazak MD., PhD Gynecologic Oncology

## 2019-03-08 LAB — CA 125: Cancer Antigen (CA) 125: 5.8 U/mL (ref 0.0–38.1)

## 2019-03-11 ENCOUNTER — Telehealth: Payer: Self-pay

## 2019-03-11 NOTE — Telephone Encounter (Signed)
Told Kathryn Stewart that her CA-125 was stable and WNL at 5.8 per Joylene John, NP.

## 2019-03-14 ENCOUNTER — Ambulatory Visit (HOSPITAL_COMMUNITY)
Admission: RE | Admit: 2019-03-14 | Discharge: 2019-03-14 | Disposition: A | Discharge status: Home / Self Care | Payer: BC Managed Care – PPO | Source: Ambulatory Visit | Attending: Gynecologic Oncology | Admitting: Gynecologic Oncology

## 2019-03-14 ENCOUNTER — Other Ambulatory Visit: Payer: Self-pay

## 2019-03-14 DIAGNOSIS — C541 Malignant neoplasm of endometrium: Secondary | ICD-10-CM

## 2019-03-14 MED ORDER — SODIUM CHLORIDE (PF) 0.9 % IJ SOLN
INTRAMUSCULAR | Status: AC
  Filled 2019-03-14: qty 50

## 2019-03-14 MED ORDER — IOHEXOL 300 MG/ML  SOLN
100.0000 mL | Freq: Once | INTRAMUSCULAR | Status: AC | PRN
  Administered 2019-03-14: 100 mL via INTRAVENOUS

## 2019-03-15 ENCOUNTER — Telehealth: Payer: Self-pay

## 2019-03-15 NOTE — Telephone Encounter (Signed)
Told Ms Tatar that the CT scan showed no evidence of recurrent or metastatic disease in the chest, abdomen, or pelvis per Joylene John, NP.

## 2019-05-09 ENCOUNTER — Telehealth: Payer: Self-pay | Admitting: *Deleted

## 2019-05-09 NOTE — Telephone Encounter (Signed)
Patient called for follow up appt in Oct

## 2019-07-03 NOTE — Progress Notes (Signed)
[place] at [facility]   Progress Note: Established Patient Follow-Up   Referring physician: Dr. Rubbie Battiest   CHIEF COMPLAINT:  Stage IVB endometroid endometrial adenocarcinoma. Staged 04/2018.   S/P chemotherapy 08/08/2018   HPI: Ms. Emina Ribaudo  is a very nice 38 y.o.  P0  Oncology History Overview Note  Genetics neg   Endometrial cancer ([place])  04/06/2018 Initial Diagnosis   She presented with severe abdominal pain   04/07/2018 Imaging   She presented to the emergency room after the pain woke her up CT imaging was performed 04/07/2018 [facility].  A 6.5 x 7 x 8.1 complex right adnexal mass was found with solid and fat components consistent with a teratoma.  No lymphadenopathy.  Small to moderate ascites and inflammatory changes were seen in the pelvis follow-up ultrasound was also performed showing concern for right ovarian torsion with a large ovarian mass cystic and solid components 9.7 cm.  A left ovarian cyst seen 3.4 cm.  Endometrial thickening with hypervascularity and endometrial thickness of 24 mm moderate pelvic free fluid.   04/07/2018 Surgery   Given the concern for torsion she was taken urgently to the operating room by Dr. Orson Ape. McLeod 04/07/2018.  At that time operative laparoscopy, torsion of the right adnexa was noted along with thickened abnormal endometrium.  She had right salpingo-oophorectomy through a laparoscopic approach along with endometrial biopsy.     04/07/2018 Pathology Results   Right ovary showed adenocarcinoma, endometrioid type Endometrial biopsy showed adenocarcinoma   04/23/2018 Tumor Marker   Patient's tumor was tested for the following markers: CA-125 Results of the tumor marker test revealed 73.2   04/24/2018 Surgery   Preoperative Diagnosis: At least Stage 3 Endometrial Cancer with metastases to right ovary   Postoperative Diagnosis:  At least Stage 3, possible Stage 4 Endometrial Cancer with metastases  to right ovary and questionable parametrial and sigmoid peritoneum  Procedure(s) Performed: Pelvic washings, Lysis of adhesions. Biopsy of left parametrial tissue and sigmoid peritoneum  Surgeon: Bernita Raisin, MD  Specimens: pelvic washings, parametrial biopsy, sigmoid peritoneum biopsy  Complications: none  Indication for Procedure:  Patient found at OSH to have right ovarian mass that was resected and found on permanent section to be c/w endometrial cancer. Also had endometrial biopsy showing the same.  Operative Findings: Significant adhesive disease almost miliary-like. The sigmoid was adherent to the left cornua. After takedown of the sigmoid in this region it was clear the left cornua was involved in an abnormal process including necrotic tissue clinically concerning for malignancy. In addition the bladder was adherent to this region. Posterior surface of the broad ligament in area c/w the parametria on the left showed likely tumor breakthrough. Images were taken. Biopsy from the tissue extruding from the left side of the uterus in the presumed parametrium was sent for frozen. This returned worrisome for disease, but not definitive. A separate area of the sigmoid was adherent inferior to the posterior surface lesion and the remnant of this adhesion was sent as sigmoid peritoneum. This, on frozen, returned with fibrosis.    04/24/2018 Pathology Results   1. Peritoneum, biopsy - METASTATIC CARCINOMA CONSISTENT WITH PATIENT'S CLINICAL HISTORY OF PRIMARY ENDOMETRIAL CARCINOMA. SEE NOTE 2. Soft tissue, biopsy, parametrial - METASTATIC CARCINOMA CONSISTENT WITH PATIENT'S CLINICAL HISTORY OF PRIMARY ENDOMETRIAL CARCINOMA. SEE NOTE Diagnosis Note 1. and 2. Immunohistochemical stains show that the tumor cells are positive for PAX8 and negative for calretinin, consistent with the above  diagnosis   04/25/2018 Cancer Staging   Staging form: Corpus Uteri - Carcinoma and Carcinosarcoma, AJCC  8th Edition - Clinical: Stage IVA (cT4, cN0, cM0) - Signed by Heath Lark, MD on 04/25/2018   05/02/2018 Tumor Marker   Patient's tumor was tested for the following markers: CA-125 Results of the tumor marker test revealed 56.7   05/04/2018 - 08/28/2018 Chemotherapy   The patient had carboplatin and Taxol   05/08/2018 Procedure   Placement of a power injectable Port-A-Cath   05/29/2018 Tumor Marker   Patient's tumor was tested for the following markers: CA-125 Results of the tumor marker test revealed 31.6   06/19/2018 Tumor Marker   Patient's tumor was tested for the following markers: CA-125 Results of the tumor marker test revealed 21.1   06/28/2018 Imaging   No evidence of malignancy or other acute findings within the abdomen or pelvis.  Moderate hepatic steatosis.   07/10/2018 Tumor Marker   Patient's tumor was tested for the following markers: CA-125 Results of the tumor marker test revealed 12.6   09/20/2018 Pathology Results   1. Soft tissue, biopsy, right broad ligament nodule - BENIGN FIBROUS TISSUE WITH FOREIGN BODY GIANT CELL REACTION. - THERE IS NO EVIDENCE OF MALIGNANCY. 2. Soft tissue, biopsy, uterine serosal nodule - BENIGN FIBROUS TISSUE WITH FOREIGN BODY GIANT CELL REACTION. - THERE IS NO EVIDENCE OF MALIGNANCY. 3. Uterus and cervix, left tube and ovary - ENDOMETRIUM: ENDOMETRIOID ADENOCARCINOMA, FIGO GRADE I/III, SCATTERED MICROSCOPIC FOCI. - ADENOCARCINOMA INVOLVES THE INNER HALF OF THE MYOMETRIUM. - MYOMETRIUM: LEIOMYOMA. - SEROSA: ADHESIONS. - RIGHT FALLOPIAN TUBE: UNREMARKABLE. - LEFT ADNEXA: ENDOMETRIOID ADENOCARCINOMA INVOLVING THE OVARY. - BENIGN FALLOPIAN TUBE. - SEE ONCOLOGY TABLE BELOW. Microscopic Comment 3. UTERUS, CARCINOMA OR CARCINOSARCOMA Procedure: Hysterectomy, bilateral fallopian tube resection, and left ovarian oophorectomy. Histologic type: Endometrioid adenocarcinoma. Histologic Grade: FIGO Grade I Myometrial invasion: Depth of  invasion: 2 mm Myometrial thickness: 15 mm Uterine Serosa Involvement: Not identified Cervical stromal involvement: Not identified Extent of involvement of other organs: Involves the left ovary Lymphovascular invasion: Present Regional Lymph Nodes: None examined MMR testing by IHC will be performed on block 3-N Pathologic Stage Classification (pTNM, AJCC 8th edition): pT3, pNX FIGO Stage: At least FIGO Stage III-A   09/20/2018 Surgery   Procedure(s) Performed:  1. Robotic-assisted laparoscopic hysterectomy, left salpingoophorectomy 2. Lysis of adhesions ~40 minutes  Surgeon: Bernita Raisin, MD  Specimens: Uterus with attached cervix, left tube/ovary, right IP ligament  Operative Findings: Adhesive disease of colon to left sidewall/left adnexa (~30 min lysis of adhesions). Filmy adhesions of right colon to culdesac. Residual right IP ligament. Small areas of questionable peritoneal disease x 2 removed and sent for pathology. No other gross residual disease. There was adhesive disease between the bladder and the cervix/LUS.   10/31/2018 Tumor Marker   Patient's tumor was tested for the following markers: CA-125 Results of the tumor marker test revealed 5.6   12/17/2018 Imaging   1. No evidence of recurrent or metastatic carcinoma within the abdomen or pelvis. 2. Stable hepatic steatosis.   02/05/2019 Procedure   Successful removal of implanted Port-A-Cath.       Interval History  She has treatment related alopecia. No complaints. Denies bleeding, denies pain.    Measurement of disease: CA125 potentially and clinical exam, likely will plan imaging for some portion of surveillance . Preop (however after RSO) 04/23/18 = 73.2 . 05/02/18 = 56.7 (prechemo)  . 10/31/18 5.6  Outpatient Encounter Medications as of 07/04/2019  Medication  Sig  . albuterol (VENTOLIN HFA) 108 (90 Base) MCG/ACT inhaler INHALE 2 PUFFS BY MOUTH EVERY 4 TO 6 HOURS AS NEEDED  . cetirizine (ZYRTEC) 10  MG tablet Take 10 mg by mouth daily.  Marland Kitchen FARXIGA 10 MG TABS tablet Take 10 mg by mouth daily.  . fenofibrate (TRICOR) 48 MG tablet Take 48 mg by mouth daily.  . fluticasone (FLONASE) 50 MCG/ACT nasal spray Place 1 spray into both nostrils daily as needed for allergies.   Marland Kitchen LANTUS SOLOSTAR 100 UNIT/ML Solostar Pen Inject 38 Units into the skin at bedtime.   Marland Kitchen lisinopril (PRINIVIL,ZESTRIL) 10 MG tablet Take 10 mg by mouth at bedtime.   . metFORMIN (GLUCOPHAGE) 1000 MG tablet Take 1,000 mg by mouth 2 (two) times daily with a meal.   No facility-administered encounter medications on file as of 07/04/2019.    Allergies  Allergen Reactions  . Latex Hives and Rash    Per DR. Gerarda Fraction. Question if this is a latex allergy versus Dermabond   . Other Rash    Dermabond - unsure allergy    Past Medical History:  Diagnosis Date  . #[id number] 04/2018  . Arthritis   . Asthma   . 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 GERD (gastroesophageal reflux disease)   . Hypertension   . PONV (postoperative nausea and vomiting)    Past Surgical History:  Procedure Laterality Date  . IR IMAGING GUIDED PORT INSERTION  05/08/2018  . IR REMOVAL TUN ACCESS W/ PORT W/O FL MOD SED  02/05/2019  . LAPAROSCOPIC SALPINGOOPHERECTOMY Right 2019   torsion  . ROBOTIC ASSISTED TOTAL HYSTERECTOMY WITH BILATERAL SALPINGO OOPHERECTOMY N/A 04/24/2018   Procedure: XI ROBOTIC  DIAGNOSTIC LAPAROSCOPY WITH BIOPSY.;- DID NOT HAVE THIS SURGERY!  . ROBOTIC ASSISTED TOTAL HYSTERECTOMY WITH BILATERAL SALPINGO OOPHERECTOMY N/A 09/20/2018   Procedure: XI ROBOTIC ASSISTED TOTAL HYSTERECTOMY WITH LEFT SALPINGO OOPHORECTOMY;  Surgeon: [name], MD;  Location: [facility];  Service: Gynecology;  Laterality: N/A;        Past Gynecological History:   GYNECOLOGIC HISTORY:  . Patient's last menstrual period was 04/07/2018 (approximate).  . Menarche: 38 years old . P 0 . Contraceptive none . HRT N/A  . Last Pap sure  Family Hx:  Family History  Problem Relation Age of Onset  . Diabetes Mother   . Cervical cancer Mother   . Throat cancer Mother 29  . Heart attack Father   . Diabetes Brother   . Ovarian cancer Maternal Aunt 21  . Uterine cancer Maternal Aunt 21  . Breast cancer Maternal Aunt 21  . Epilepsy Paternal Aunt   . Kidney cancer Brother 74  . Ovarian cancer Cousin 8   Social Hx:  Marland Kitchen Tobacco use: None . Alcohol use: None . Illicit Drug use: None . Illicit IV Drug use: Never    Review of Systems: Review of Systems  Constitutional: Negative.  Negative for appetite change, chills, diaphoresis, fatigue, fever and unexpected weight change.  HENT:  Negative.   Eyes: Negative.   Respiratory: Negative.   Cardiovascular: Negative.  Negative for chest pain, leg swelling and palpitations.  Gastrointestinal: Negative for abdominal distention, abdominal pain, blood in stool, constipation, diarrhea, nausea and rectal pain.  Endocrine: Negative.   Genitourinary: Negative.  Negative for bladder incontinence, difficulty urinating, dysuria, frequency, hematuria, nocturia, pelvic pain, vaginal bleeding and vaginal discharge.        No vaginal bleeding, vaginal dryness  Musculoskeletal: Negative.  Bilateral breast pain for several days no nipple discharge no inflammatory skin changes   Skin: Negative.  Negative for itching and rash.  Neurological: Negative.   Hematological: Negative.   Psychiatric/Behavioral: Negative.   All other systems reviewed and are negative.  Vitals: Blood pressure 121/87, pulse 98, temperature 98.7 F (37.1 C), temperature source Oral, resp. rate 20, height 5' 1.5" (1.562 m), weight 166 lb 12.8 oz (75.7 kg), SpO2 100 %. Vitals:   07/04/19 0944  Weight: 186 lb 12.8 oz (84.7 kg)  Height: 5' 1" (1.549 m)    There were no vitals filed for this visit. There is no height or weight on file to calculate BMI.   Physical Exam: General :  Well developed, 38 y.o., female  in no apparent distress HEENT:  Normocephalic/atraumatic, symmetric, EOMI, eyelids normal Neck:   No visible masses.  Respiratory:  Respirations unlabored, no use of accessory muscles CV:   Regular rate and rhythm  breast:  Symmetric bilaterally no masses nipple retraction or dimpling Lymph nodes: No cervical supraclavicular inguinal or axillary adenopathy Musculoskeletal: Normal muscle strength. Abdomen:  Trocar sites are CDI No visible masses or protrusion Extremities:  No visible edema or deformities Skin:   Normal inspection Neuro/Psych:  No focal motor deficit, no abnormal mental status. Normal gait. Normal affect. Alert and oriented to person, place, and time   Pelvic:  Nl EGBUS, atrophic vagina, no parametrial nodularity, No pelvic masses Rectal:  Good tone no masses   Assessment  Locally advanced (pelvic peritoneum) Endometrial cancer  Plan  1. We previously discussed referral to genetics given her young age. She has seen them and test results showed no pathogenic mutations 2. Management discussion ? She has completed neoadjuvant chemotherapy and surgery ? S/P referral to Rad onc for discussion about XRT given parametrial involvement but was  was advised against treatment at this time.   Return in three months  3.  Mastodynia Reports bilateral breast discomfort for several days no nipple discharge.  Review of genetics evaluation is notable for negative BRCA1 BRCA2 and Lynch tumor markers although her family history is negative for mother and terminal grandmother with breast cancer.  On physical examination no masses were appreciated. Was counseled regarding symptomatic management of this discomfort Will order signed screening mammogram when she turns 38 this year 4. Counseled about signs and symptoms of recurrence.    Cc: Albertina Parr, MD (Referring Ob/Gyn) Monico Blitz, MD (PCP)

## 2019-07-04 ENCOUNTER — Inpatient Hospital Stay: Payer: BC Managed Care – PPO | Attending: Gynecologic Oncology | Admitting: Gynecologic Oncology

## 2019-07-04 ENCOUNTER — Other Ambulatory Visit: Payer: Self-pay

## 2019-07-04 VITALS — BP 137/95 | HR 98 | Temp 98.2°F | Resp 18 | Ht 61.0 in | Wt 186.8 lb

## 2019-07-04 DIAGNOSIS — Z794 Long term (current) use of insulin: Secondary | ICD-10-CM | POA: Insufficient documentation

## 2019-07-04 DIAGNOSIS — C541 Malignant neoplasm of endometrium: Secondary | ICD-10-CM | POA: Diagnosis present

## 2019-07-04 DIAGNOSIS — Z9221 Personal history of antineoplastic chemotherapy: Secondary | ICD-10-CM | POA: Insufficient documentation

## 2019-07-04 DIAGNOSIS — I10 Essential (primary) hypertension: Secondary | ICD-10-CM | POA: Insufficient documentation

## 2019-07-04 DIAGNOSIS — Z90710 Acquired absence of both cervix and uterus: Secondary | ICD-10-CM | POA: Diagnosis not present

## 2019-07-04 DIAGNOSIS — K219 Gastro-esophageal reflux disease without esophagitis: Secondary | ICD-10-CM | POA: Diagnosis not present

## 2019-07-04 DIAGNOSIS — E119 Type 2 diabetes mellitus without complications: Secondary | ICD-10-CM | POA: Insufficient documentation

## 2019-07-04 DIAGNOSIS — Z1239 Encounter for other screening for malignant neoplasm of breast: Secondary | ICD-10-CM

## 2019-07-04 DIAGNOSIS — N644 Mastodynia: Secondary | ICD-10-CM | POA: Insufficient documentation

## 2019-07-04 DIAGNOSIS — Z79899 Other long term (current) drug therapy: Secondary | ICD-10-CM | POA: Diagnosis not present

## 2019-07-04 DIAGNOSIS — Z90722 Acquired absence of ovaries, bilateral: Secondary | ICD-10-CM | POA: Insufficient documentation

## 2019-07-04 DIAGNOSIS — C7961 Secondary malignant neoplasm of right ovary: Secondary | ICD-10-CM | POA: Diagnosis not present

## 2019-07-04 NOTE — Patient Instructions (Addendum)
F/u in 3 months. Please call our office at the end of November or December to schedule your appointment for three months. We have also ordered a mammogram for you to have when you turn 38.  Breast Tenderness Breast tenderness is a common problem for women of all ages. Breast tenderness may cause mild discomfort to severe pain. The pain usually comes and goes in association with your menstrual cycle, but it can be constant. Breast tenderness has many possible causes, including hormone changes and some medicines. Your health care provider may order tests, such as a mammogram or an ultrasound, to check for any unusual findings. Having breast tenderness usually does not mean that you have breast cancer. Follow these instructions at home: Sometimes, reassurance that you do not have breast cancer is all that is needed. In general, follow these home care instructions: Managing pain and discomfort   If directed, apply ice to the area: ? Put ice in a plastic bag. ? Place a towel between your skin and the bag. ? Leave the ice on for 20 minutes, 2-3 times a day.  Make sure you are wearing a supportive bra, especially during exercise. You may also want to wear a supportive bra while sleeping if your breasts are very tender. Medicines  Take over-the-counter and prescription medicines only as told by your health care provider. If the cause of your pain is infection, you may be prescribed an antibiotic medicine.  If you were prescribed an antibiotic, take it as told by your health care provider. Do not stop taking the antibiotic even if you start to feel better. General instructions   Your health care provider may recommend that you reduce the amount of fat in your diet. You can do this by: ? Limiting fried foods. ? Cooking foods using methods, such as baking, boiling, grilling, and broiling.  Decrease the amount of caffeine in your diet. You can do this by drinking more water and choosing caffeine-free  options.  Keep a log of the days and times when your breasts are most tender.  Ask your health care provider how to do breast exams at home. This will help you notice if you have an unusual growth or lump. Contact a health care provider if:  Any part of your breast is hard, red, and hot to the touch. This may be a sign of infection.  You are not breastfeeding and you have fluid, especially blood or pus, coming out of your nipples.  You have a fever.  You have a new or painful lump in your breast that remains after your menstrual period ends.  Your pain does not improve or it gets worse.  Your pain is interfering with your daily activities. This information is not intended to replace advice given to you by your health care provider. Make sure you discuss any questions you have with your health care provider. Document Released: 08/04/2008 Document Revised: 08/04/2017 Document Reviewed: 05/20/2016 Elsevier Patient Education  2020 [employer].

## 2019-07-17 ENCOUNTER — Encounter (INDEPENDENT_AMBULATORY_CARE_PROVIDER_SITE_OTHER): Payer: Self-pay | Admitting: Ophthalmology

## 2019-09-17 ENCOUNTER — Telehealth: Payer: Self-pay | Admitting: *Deleted

## 2019-09-17 NOTE — Telephone Encounter (Signed)
Returned the patient's call and scheduled a follow up appt for Feb.

## 2019-10-16 ENCOUNTER — Telehealth: Payer: Self-pay | Admitting: *Deleted

## 2019-10-16 NOTE — Telephone Encounter (Signed)
Per provider called and moved the paitent's appt from 2/17 to 3/3

## 2019-10-23 ENCOUNTER — Ambulatory Visit: Payer: BC Managed Care – PPO | Admitting: Obstetrics & Gynecology

## 2019-11-05 NOTE — Progress Notes (Addendum)
[facility]   Progress Note: Established Patient Follow-Up   Referring physician: Dr. Rubbie Battiest   CHIEF COMPLAINT:  Stage IVB endometroid endometrial adenocarcinoma. Staged 04/2018.   S/P chemotherapy 08/08/2018   HPI: [name]  is a very nice 39 y.o.  P0.  She denies vaginal bleeding, cough, pain, lethargy, weight loss or HA.  At the previous visit she complained of breast pain.  This has resolved.  In the interim she has had a negative mammogram  Oncology History Overview Note  Genetics neg   Endometrial cancer ([place])  04/06/2018 Initial Diagnosis   She presented with severe abdominal pain   04/07/2018 Imaging   She presented to the emergency room after the pain woke her up CT imaging was performed 04/07/2018 [facility].  A 6.5 x 7 x 8.1 complex right adnexal mass was found with solid and fat components consistent with a teratoma.  No lymphadenopathy.  Small to moderate ascites and inflammatory changes were seen in the pelvis follow-up ultrasound was also performed showing concern for right ovarian torsion with a large ovarian mass cystic and solid components 9.7 cm.  A left ovarian cyst seen 3.4 cm.  Endometrial thickening with hypervascularity and endometrial thickness of 24 mm moderate pelvic free fluid.   04/07/2018 Surgery   Given the concern for torsion she was taken urgently to the operating room by Dr. Orson Ape. McLeod 04/07/2018.  At that time operative laparoscopy, torsion of the right adnexa was noted along with thickened abnormal endometrium.  She had right salpingo-oophorectomy through a laparoscopic approach along with endometrial biopsy.     04/07/2018 Pathology Results   Right ovary showed adenocarcinoma, endometrioid type Endometrial biopsy showed adenocarcinoma   04/23/2018 Tumor Marker   Patient's tumor was tested for the following markers: CA-125 Results of the tumor marker test revealed 73.2   04/24/2018  Surgery   Preoperative Diagnosis: At least Stage 3 Endometrial Cancer with metastases to right ovary   Postoperative Diagnosis:  At least Stage 3, possible Stage 4 Endometrial Cancer with metastases to right ovary and questionable parametrial and sigmoid peritoneum  Procedure(s) Performed: Pelvic washings, Lysis of adhesions. Biopsy of left parametrial tissue and sigmoid peritoneum  Surgeon: Bernita Raisin, MD  Specimens: pelvic washings, parametrial biopsy, sigmoid peritoneum biopsy  Complications: none  Indication for Procedure:  Patient found at OSH to have right ovarian mass that was resected and found on permanent section to be c/w endometrial cancer. Also had endometrial biopsy showing the same.  Operative Findings: Significant adhesive disease almost miliary-like. The sigmoid was adherent to the left cornua. After takedown of the sigmoid in this region it was clear the left cornua was involved in an abnormal process including necrotic tissue clinically concerning for malignancy. In addition the bladder was adherent to this region. Posterior surface of the broad ligament in area c/w the parametria on the left showed likely tumor breakthrough. Images were taken. Biopsy from the tissue extruding from the left side of the uterus in the presumed parametrium was sent for frozen. This returned worrisome for disease, but not definitive. A separate area of the sigmoid was adherent inferior to the posterior surface lesion and the remnant of this adhesion was sent as sigmoid peritoneum. This, on frozen, returned with fibrosis.    04/24/2018 Pathology Results   1. Peritoneum, biopsy - METASTATIC CARCINOMA CONSISTENT WITH PATIENT'S CLINICAL HISTORY OF PRIMARY ENDOMETRIAL CARCINOMA. SEE NOTE 2. Soft tissue, biopsy, parametrial - METASTATIC  CARCINOMA CONSISTENT WITH PATIENT'S CLINICAL HISTORY OF PRIMARY ENDOMETRIAL CARCINOMA. SEE NOTE Diagnosis Note 1. and 2. Immunohistochemical stains show  that the tumor cells are positive for PAX8 and negative for calretinin, consistent with the above diagnosis   04/25/2018 Cancer Staging   Staging form: Corpus Uteri - Carcinoma and Carcinosarcoma, AJCC 8th Edition - Clinical: Stage IVA (cT4, cN0, cM0) - Signed by Heath Lark, MD on 04/25/2018   05/02/2018 Tumor Marker   Patient's tumor was tested for the following markers: CA-125 Results of the tumor marker test revealed 56.7   05/04/2018 - 08/28/2018 Chemotherapy   The patient had carboplatin and Taxol   05/08/2018 Procedure   Placement of a power injectable Port-A-Cath   05/29/2018 Tumor Marker   Patient's tumor was tested for the following markers: CA-125 Results of the tumor marker test revealed 31.6   06/19/2018 Tumor Marker   Patient's tumor was tested for the following markers: CA-125 Results of the tumor marker test revealed 21.1   06/25/2018 Genetic Testing   Patient has genetic testing done for 64 genes See report under Media on 06/25/2018 Pathology molecular report Results revealed patient has the following mutation(s): none   06/28/2018 Imaging   No evidence of malignancy or other acute findings within the abdomen or pelvis.  Moderate hepatic steatosis.   07/10/2018 Tumor Marker   Patient's tumor was tested for the following markers: CA-125 Results of the tumor marker test revealed 12.6   09/20/2018 Pathology Results   1. Soft tissue, biopsy, right broad ligament nodule - BENIGN FIBROUS TISSUE WITH FOREIGN BODY GIANT CELL REACTION. - THERE IS NO EVIDENCE OF MALIGNANCY. 2. Soft tissue, biopsy, uterine serosal nodule - BENIGN FIBROUS TISSUE WITH FOREIGN BODY GIANT CELL REACTION. - THERE IS NO EVIDENCE OF MALIGNANCY. 3. Uterus and cervix, left tube and ovary - ENDOMETRIUM: ENDOMETRIOID ADENOCARCINOMA, FIGO GRADE I/III, SCATTERED MICROSCOPIC FOCI. - ADENOCARCINOMA INVOLVES THE INNER HALF OF THE MYOMETRIUM. - MYOMETRIUM: LEIOMYOMA. - SEROSA: ADHESIONS. - RIGHT  FALLOPIAN TUBE: UNREMARKABLE. - LEFT ADNEXA: ENDOMETRIOID ADENOCARCINOMA INVOLVING THE OVARY. - BENIGN FALLOPIAN TUBE. - SEE ONCOLOGY TABLE BELOW. Microscopic Comment 3. UTERUS, CARCINOMA OR CARCINOSARCOMA Procedure: Hysterectomy, bilateral fallopian tube resection, and left ovarian oophorectomy. Histologic type: Endometrioid adenocarcinoma. Histologic Grade: FIGO Grade I Myometrial invasion: Depth of invasion: 2 mm Myometrial thickness: 15 mm Uterine Serosa Involvement: Not identified Cervical stromal involvement: Not identified Extent of involvement of other organs: Involves the left ovary Lymphovascular invasion: Present Regional Lymph Nodes: None examined MMR testing by IHC will be performed on block 3-N Pathologic Stage Classification (pTNM, AJCC 8th edition): pT3, pNX FIGO Stage: At least FIGO Stage III-A   09/20/2018 Surgery   Procedure(s) Performed:  1. Robotic-assisted laparoscopic hysterectomy, left salpingoophorectomy 2. Lysis of adhesions ~40 minutes  Surgeon: Bernita Raisin, MD  Specimens: Uterus with attached cervix, left tube/ovary, right IP ligament  Operative Findings: Adhesive disease of colon to left sidewall/left adnexa (~30 min lysis of adhesions). Filmy adhesions of right colon to culdesac. Residual right IP ligament. Small areas of questionable peritoneal disease x 2 removed and sent for pathology. No other gross residual disease. There was adhesive disease between the bladder and the cervix/LUS.   10/31/2018 Tumor Marker   Patient's tumor was tested for the following markers: CA-125 Results of the tumor marker test revealed 5.6   12/17/2018 Imaging   1. No evidence of recurrent or metastatic carcinoma within the abdomen or pelvis. 2. Stable hepatic steatosis.   02/05/2019 Procedure   Successful removal of  implanted Port-A-Cath.       Interval History  She has treatment related alopecia. No complaints. Denies bleeding, denies  pain.    Measurement of disease: CA125 potentially and clinical exam, likely will plan imaging for some portion of surveillance . Preop (however after RSO) 04/23/18 = 73.2 . 05/02/18 = 56.7 (prechemo)  . 10/31/18 5.6 . 03/07/19 5.8  Outpatient Encounter Medications as of 11/06/2019  Medication Sig  . albuterol (VENTOLIN HFA) 108 (90 Base) MCG/ACT inhaler INHALE 2 PUFFS BY MOUTH EVERY 4 TO 6 HOURS AS NEEDED  . BD PEN NEEDLE NANO U/F 32G X 4 MM MISC USE WITH INSULIN PEN AS DIRECTED  . cetirizine (ZYRTEC) 10 MG tablet Take 10 mg by mouth daily.  . Dulaglutide (TRULICITY [place]) Inject into the skin.  Marland Kitchen FARXIGA 10 MG TABS tablet Take 10 mg by mouth daily.  . fenofibrate (TRICOR) 48 MG tablet Take 48 mg by mouth daily.  . fluticasone (FLONASE) 50 MCG/ACT nasal spray Place 1 spray into both nostrils daily as needed for allergies.   Marland Kitchen gabapentin (NEURONTIN) 100 MG capsule Take 100 mg by mouth 3 (three) times daily.  . Insulin Pen Needle 32G X 4 MM MISC Use with insulin pen as directed  . LANTUS SOLOSTAR 100 UNIT/ML Solostar Pen Inject 38 Units into the skin at bedtime.   Marland Kitchen lisinopril (PRINIVIL,ZESTRIL) 10 MG tablet Take 10 mg by mouth at bedtime.   . metFORMIN (GLUCOPHAGE) 1000 MG tablet Take 1,000 mg by mouth 2 (two) times daily with a meal.   No facility-administered encounter medications on file as of 11/06/2019.   Allergies  Allergen Reactions  . Latex Hives and Rash    Per DR. Gerarda Fraction. Question if this is a latex allergy versus Dermabond   . Other Rash    Dermabond - unsure allergy    Past Medical History:  Diagnosis Date  . #[id number] 04/2018  . Arthritis   . Asthma   . 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 GERD (gastroesophageal reflux disease)   . Hypertension   . PONV (postoperative nausea and vomiting)    Past Surgical History:  Procedure Laterality Date  . IR IMAGING GUIDED PORT INSERTION  05/08/2018  . IR REMOVAL TUN ACCESS W/ PORT W/O FL MOD SED   02/05/2019  . LAPAROSCOPIC SALPINGOOPHERECTOMY Right 2019   torsion  . ROBOTIC ASSISTED TOTAL HYSTERECTOMY WITH BILATERAL SALPINGO OOPHERECTOMY N/A 04/24/2018   Procedure: XI ROBOTIC  DIAGNOSTIC LAPAROSCOPY WITH BIOPSY.;- DID NOT HAVE THIS SURGERY!  . ROBOTIC ASSISTED TOTAL HYSTERECTOMY WITH BILATERAL SALPINGO OOPHERECTOMY N/A 09/20/2018   Procedure: XI ROBOTIC ASSISTED TOTAL HYSTERECTOMY WITH LEFT SALPINGO OOPHORECTOMY;  Surgeon: [name], MD;  Location: [facility];  Service: Gynecology;  Laterality: N/A;        Past Gynecological History:   GYNECOLOGIC HISTORY:  . Patient's last menstrual period was 04/07/2018 (approximate).  . Menarche: 38 years old . P 0 . Contraceptive none . HRT N/A  . Last Pap sure Family Hx:  Family History  Problem Relation Age of Onset  . Diabetes Mother   . Cervical cancer Mother   . Throat cancer Mother 50  . Heart attack Father   . Diabetes Brother   . Ovarian cancer Maternal Aunt 21  . Uterine cancer Maternal Aunt 21  . Breast cancer Maternal Aunt 21  . Epilepsy Paternal Aunt   . Kidney cancer Brother 74  . Ovarian cancer Cousin 70   Social Hx:  .  Tobacco use: None . Alcohol use: None . Illicit Drug use: None . Illicit IV Drug use: Never    Review of Systems: Review of Systems  Constitutional: Negative.  Negative for appetite change, chills, diaphoresis, fatigue, fever and unexpected weight change.  HENT:  Negative.   Eyes: Negative.   Respiratory: Negative.   Cardiovascular: Negative.  Negative for chest pain, leg swelling and palpitations.  Gastrointestinal: Negative for abdominal distention, abdominal pain, blood in stool, constipation, diarrhea, nausea and rectal pain.  Endocrine: Negative.   Genitourinary: Negative.  Negative for bladder incontinence, difficulty urinating, dysuria, frequency, hematuria, nocturia, pelvic pain, vaginal bleeding and vaginal discharge.        No vaginal bleeding, vaginal dryness  Musculoskeletal:  Negative.        Bilateral breast pain for several days no nipple discharge no inflammatory skin changes   Skin: Negative.  Negative for itching and rash.  Neurological: Negative.   Hematological: Negative.   Psychiatric/Behavioral: Negative.   All other systems reviewed and are negative.  Vitals: BP 120/85 (BP Location: Left Arm, Patient Position: Sitting)   Pulse 81   Temp 98.2 F (36.8 C) (Temporal)   Resp 17   Ht 5' 1"  (1.549 m)   Wt 186 lb (84.4 kg)   LMP 04/07/2018 (Approximate)   SpO2 100%   BMI 35.14 kg/m   Physical Exam  Constitutional: She appears well-developed and well-nourished. No distress.  Abdominal: Soft. She exhibits no mass. There is no abdominal tenderness.  Genitourinary: There is no rash on the right labia. There is no rash on the left labia.    Vaginal bleeding present.     No vaginal discharge.  There is bleeding in the vagina.    Genitourinary Comments: No palpable abnormality   Lymphadenopathy:       Right: No inguinal and no supraclavicular adenopathy present.       Left: No inguinal and no supraclavicular adenopathy present.       Assessment  Locally advanced (pelvic peritoneum) Endometrial cancer Negative symptom review and exam Plan  Management discussion ? Repeat CA-125 today as this has been previously used as part of surveillance for this patinent Return in three months   I personally spent 25 minutes face-to-face and non-face-to-face in the care of this patient, which includes all pre, intra, and post visit time on the date of service.   Cc: Albertina Parr, MD (Referring Ob/Gyn) Monico Blitz, MD (PCP)

## 2019-11-06 ENCOUNTER — Inpatient Hospital Stay: Payer: BC Managed Care – PPO | Attending: Obstetrics & Gynecology | Admitting: Obstetrics & Gynecology

## 2019-11-06 ENCOUNTER — Encounter: Payer: Self-pay | Admitting: Obstetrics & Gynecology

## 2019-11-06 ENCOUNTER — Other Ambulatory Visit: Payer: Self-pay

## 2019-11-06 ENCOUNTER — Inpatient Hospital Stay: Payer: BC Managed Care – PPO

## 2019-11-06 VITALS — BP 120/85 | HR 81 | Temp 98.2°F | Resp 17 | Ht 61.0 in | Wt 186.0 lb

## 2019-11-06 DIAGNOSIS — K219 Gastro-esophageal reflux disease without esophagitis: Secondary | ICD-10-CM | POA: Insufficient documentation

## 2019-11-06 DIAGNOSIS — C7961 Secondary malignant neoplasm of right ovary: Secondary | ICD-10-CM | POA: Insufficient documentation

## 2019-11-06 DIAGNOSIS — C541 Malignant neoplasm of endometrium: Secondary | ICD-10-CM | POA: Diagnosis present

## 2019-11-06 DIAGNOSIS — I10 Essential (primary) hypertension: Secondary | ICD-10-CM | POA: Diagnosis not present

## 2019-11-06 DIAGNOSIS — E119 Type 2 diabetes mellitus without complications: Secondary | ICD-10-CM | POA: Insufficient documentation

## 2019-11-06 DIAGNOSIS — Z794 Long term (current) use of insulin: Secondary | ICD-10-CM | POA: Diagnosis not present

## 2019-11-06 DIAGNOSIS — Z9221 Personal history of antineoplastic chemotherapy: Secondary | ICD-10-CM | POA: Diagnosis not present

## 2019-11-06 DIAGNOSIS — Z79899 Other long term (current) drug therapy: Secondary | ICD-10-CM | POA: Diagnosis not present

## 2019-11-06 NOTE — Patient Instructions (Signed)
CA-125 today. Recommend follow-up in 3 months

## 2019-11-07 ENCOUNTER — Telehealth: Payer: Self-pay

## 2019-11-07 LAB — CA 125: Cancer Antigen (CA) 125: 4.5 U/mL (ref 0.0–38.1)

## 2019-11-07 NOTE — Telephone Encounter (Signed)
Told Ms Garrels that her CA-125 was stable and WNL at 4.5 per [facility]

## 2019-11-17 ENCOUNTER — Encounter: Payer: Self-pay | Admitting: Gynecologic Oncology

## 2019-11-18 NOTE — Telephone Encounter (Signed)
Kathryn Stewart states that she she having urinary frequency and burning.  She has a low grade temp. The Azo is helping with the burning. Kathryn Stewart is not able to come to the [facility] until this Thursday due to her work schedule. Suggested that she call her PCP and make an appointment there as it is closer to her and she will be able to get there tomorrow if not today for a urine sample. Pt verbalized understanding.

## 2020-03-04 ENCOUNTER — Encounter: Payer: Self-pay | Admitting: Gynecologic Oncology

## 2020-03-04 ENCOUNTER — Telehealth: Payer: Self-pay | Admitting: *Deleted

## 2020-03-04 NOTE — Telephone Encounter (Signed)
I spoke with Ms. Mcaulay regarding her concern with the elevation in the Immature Granulocytes from her CBC  yesterday. Her labs were reviewed by our provider, the results did not show a significant elevation. I encouraged the patient to discuss the results of her CBC with the provider who ordered the blood work. Pt verbalizes understanding.

## 2020-04-14 ENCOUNTER — Other Ambulatory Visit: Payer: Self-pay | Admitting: Gynecologic Oncology

## 2020-04-14 ENCOUNTER — Encounter: Payer: Self-pay | Admitting: Gynecologic Oncology

## 2020-04-14 DIAGNOSIS — C541 Malignant neoplasm of endometrium: Secondary | ICD-10-CM

## 2020-04-14 NOTE — Progress Notes (Signed)
Chart reviewed with Dr. Berline Lopes after mychart message from patient asking about need for scans for surveillance. Plan for baseline CT CAP in the fall with an appt after.  Patient is overdue for an appt so will get it scheduled.

## 2020-04-20 ENCOUNTER — Telehealth: Payer: Self-pay | Admitting: *Deleted

## 2020-04-20 NOTE — Telephone Encounter (Signed)
Called and scheduled the patient for a follow up appt on 9/1

## 2020-04-21 ENCOUNTER — Other Ambulatory Visit: Payer: Self-pay | Admitting: Gynecologic Oncology

## 2020-04-21 ENCOUNTER — Telehealth: Payer: Self-pay | Admitting: *Deleted

## 2020-04-21 DIAGNOSIS — C541 Malignant neoplasm of endometrium: Secondary | ICD-10-CM

## 2020-04-21 NOTE — Telephone Encounter (Signed)
Received a message from the authorization department that the patient's scan needs to be moved to [place] per her insurance. [facility] is out of network. Appt moved to [facility] on 8/26 at 4pm. Called and gave the patient the new location and date/time. Patient picked up contrast today from [facility]

## 2020-04-30 ENCOUNTER — Ambulatory Visit (HOSPITAL_COMMUNITY): Payer: BC Managed Care – PPO

## 2020-04-30 ENCOUNTER — Ambulatory Visit
Admission: RE | Admit: 2020-04-30 | Discharge: 2020-04-30 | Disposition: A | Discharge status: Home / Self Care | Payer: BC Managed Care – PPO | Source: Ambulatory Visit | Attending: Gynecologic Oncology | Admitting: Gynecologic Oncology

## 2020-04-30 DIAGNOSIS — C541 Malignant neoplasm of endometrium: Secondary | ICD-10-CM

## 2020-04-30 MED ORDER — IOPAMIDOL (ISOVUE-300) INJECTION 61%
100.0000 mL | Freq: Once | INTRAVENOUS | Status: AC | PRN
  Administered 2020-04-30: 100 mL via INTRAVENOUS

## 2020-05-01 ENCOUNTER — Telehealth: Payer: Self-pay

## 2020-05-01 NOTE — Telephone Encounter (Signed)
Told Ms Raftery that the CT scan was stable and no evidence of recurrence.  Future scans are based on symptoms. She needs to keep appointment with Dr. Delsa Sale on 05-06-20 as scheduled per Joylene John, NP. Pt verbalized understanding.

## 2020-05-01 NOTE — Telephone Encounter (Signed)
LM to call back to the office to discuss the results of the CT scan.

## 2020-05-05 NOTE — Progress Notes (Addendum)
Follow Up Note: Gyn-Onc  Kathryn Stewart 39 y.o. female  CC: She presents for a f/u visit.  HPI: Oncology History Overview Note  Genetics neg   Endometrial cancer ([place])  04/06/2018 Initial Diagnosis   She presented with severe abdominal pain   04/07/2018 Imaging   She presented to the emergency room after the pain woke her up CT imaging was performed 04/07/2018 [facility].  A 6.5 x 7 x 8.1 complex right adnexal mass was found with solid and fat components consistent with a teratoma.  No lymphadenopathy.  Small to moderate ascites and inflammatory changes were seen in the pelvis follow-up ultrasound was also performed showing concern for right ovarian torsion with a large ovarian mass cystic and solid components 9.7 cm.  A left ovarian cyst seen 3.4 cm.  Endometrial thickening with hypervascularity and endometrial thickness of 24 mm moderate pelvic free fluid.   04/07/2018 Surgery   Given the concern for torsion she was taken urgently to the operating room by Dr. Orson Ape. McLeod 04/07/2018.  At that time operative laparoscopy, torsion of the right adnexa was noted along with thickened abnormal endometrium.  She had right salpingo-oophorectomy through a laparoscopic approach along with endometrial biopsy.     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 Biopsy of left  parametrial tissue and sigmoid peritoneum  Surgeon: Bernita Raisin, MD  Specimens: pelvic washings, parametrial biopsy, sigmoid peritoneum biopsy  Complications: none  Indication for Procedure:  Patient found at OSH to have right ovarian mass that was resected and found on permanent section to be c/w endometrial cancer. Also had endometrial biopsy showing the same.  Operative Findings: Significant adhesive disease almost miliary-like. The sigmoid was adherent to the left cornua. After takedown of the sigmoid in this region it was clear the left cornua was involved in an abnormal process including necrotic tissue clinically concerning for malignancy. In addition the bladder was adherent to this region. Posterior surface of the broad ligament in area c/w the parametria on the left showed likely tumor breakthrough. Images were taken. Biopsy from the tissue extruding from the left side of the uterus in the presumed parametrium was sent for frozen. This returned worrisome for disease, but not definitive. A separate area of the sigmoid was adherent inferior to the posterior surface lesion and the remnant of this adhesion was sent as sigmoid peritoneum. This, on frozen, returned with fibrosis.    04/24/2018 Pathology Results   1. Peritoneum, biopsy - METASTATIC CARCINOMA CONSISTENT WITH PATIENT'S CLINICAL HISTORY OF PRIMARY ENDOMETRIAL CARCINOMA. SEE NOTE 2. Soft tissue, biopsy, parametrial - METASTATIC CARCINOMA CONSISTENT WITH PATIENT'S CLINICAL HISTORY OF PRIMARY ENDOMETRIAL CARCINOMA. SEE NOTE Diagnosis Note 1. and 2. Immunohistochemical stains show that the tumor cells are positive for PAX8 and negative for calretinin, consistent with the above diagnosis   04/25/2018 Cancer Staging   Staging form: Corpus Uteri - Carcinoma and Carcinosarcoma, AJCC 8th Edition - Clinical: Stage IVA (cT4, cN0, cM0) - Signed by Heath Lark, MD on 04/25/2018  05/02/2018 Tumor Marker   Patient's tumor was  tested for the following markers: CA-125 Results of the tumor marker test revealed 56.7   05/04/2018 - 08/28/2018 Chemotherapy   The patient had carboplatin and Taxol   05/08/2018 Procedure   Placement of a power injectable Port-A-Cath   05/29/2018 Tumor Marker   Patient's tumor was tested for the following markers: CA-125 Results of the tumor marker test revealed 31.6   06/19/2018 Tumor Marker   Patient's tumor was tested for the following markers: CA-125 Results of the tumor marker test revealed 21.1   06/25/2018 Genetic Testing   Patient has genetic testing done for 64 genes See report under Media on 06/25/2018 Pathology molecular report Results revealed patient has the following mutation(s): none   06/28/2018 Imaging   No evidence of malignancy or other acute findings within the abdomen or pelvis.  Moderate hepatic steatosis.   07/10/2018 Tumor Marker   Patient's tumor was tested for the following markers: CA-125 Results of the tumor marker test revealed 12.6   09/20/2018 Pathology Results   1. Soft tissue, biopsy, right broad ligament nodule - BENIGN FIBROUS TISSUE WITH FOREIGN BODY GIANT CELL REACTION. - THERE IS NO EVIDENCE OF MALIGNANCY. 2. Soft tissue, biopsy, uterine serosal nodule - BENIGN FIBROUS TISSUE WITH FOREIGN BODY GIANT CELL REACTION. - THERE IS NO EVIDENCE OF MALIGNANCY. 3. Uterus and cervix, left tube and ovary - ENDOMETRIUM: ENDOMETRIOID ADENOCARCINOMA, FIGO GRADE I/III, SCATTERED MICROSCOPIC FOCI. - ADENOCARCINOMA INVOLVES THE INNER HALF OF THE MYOMETRIUM. - MYOMETRIUM: LEIOMYOMA. - SEROSA: ADHESIONS. - RIGHT FALLOPIAN TUBE: UNREMARKABLE. - LEFT ADNEXA: ENDOMETRIOID ADENOCARCINOMA INVOLVING THE OVARY. - BENIGN FALLOPIAN TUBE. - SEE ONCOLOGY TABLE BELOW. Microscopic Comment 3. UTERUS, CARCINOMA OR CARCINOSARCOMA Procedure: Hysterectomy, bilateral fallopian tube resection, and left ovarian oophorectomy. Histologic type: Endometrioid  adenocarcinoma. Histologic Grade: FIGO Grade I Myometrial invasion: Depth of invasion: 2 mm Myometrial thickness: 15 mm Uterine Serosa Involvement: Not identified Cervical stromal involvement: Not identified Extent of involvement of other organs: Involves the left ovary Lymphovascular invasion: Present Regional Lymph Nodes: None examined MMR testing by IHC will be performed on block 3-N Pathologic Stage Classification (pTNM, AJCC 8th edition): pT3, pNX FIGO Stage: At least FIGO Stage III-A   09/20/2018 Surgery   Procedure(s) Performed:  1. Robotic-assisted laparoscopic hysterectomy, left salpingoophorectomy 2. Lysis of adhesions ~40 minutes  Surgeon: Bernita Raisin, MD  Specimens: Uterus with attached cervix, left tube/ovary, right IP ligament  Operative Findings: Adhesive disease of colon to left sidewall/left adnexa (~30 min lysis of adhesions). Filmy adhesions of right colon to culdesac. Residual right IP ligament. Small areas of questionable peritoneal disease x 2 removed and sent for pathology. No other gross residual disease. There was adhesive disease between the bladder and the cervix/LUS.   10/31/2018 Tumor Marker   Patient's tumor was tested for the following markers: CA-125 Results of the tumor marker test revealed 5.6   12/17/2018 Imaging   1. No evidence of recurrent or metastatic carcinoma within the abdomen or pelvis. 2. Stable hepatic steatosis.   02/05/2019 Procedure   Successful removal of implanted Port-A-Cath.   11/06/2019 Tumor Marker   Patient's tumor was tested for the following markers: CA-125.  Results of the tumor marker test revealed 4.5.    Interval History: She denies any vaginal bleeding, abdominal/pelvic pain, cough, lethargy or abdominal distention.    Review of Systems  Review of Systems  Constitutional: Negative for malaise/fatigue.  Respiratory: Negative for cough.   Gastrointestinal: Negative for abdominal pain.  Genitourinary:        Negative for vaginal bleeding    Current Meds:  Outpatient Encounter Medications as of 05/06/2020  Medication Sig  . albuterol (VENTOLIN HFA) 108 (90 Base) MCG/ACT inhaler INHALE 2 PUFFS BY MOUTH EVERY 4 TO 6 HOURS AS NEEDED  . BD PEN NEEDLE NANO U/F 32G X 4 MM MISC USE WITH INSULIN PEN AS DIRECTED  . cetirizine (ZYRTEC) 10 MG tablet Take 10 mg by mouth daily.  Marland Kitchen escitalopram (LEXAPRO) 10 MG tablet Take 10 mg by mouth daily.  Marland Kitchen FARXIGA 10 MG TABS tablet Take 10 mg by mouth daily.  . fenofibrate (TRICOR) 48 MG tablet Take 48 mg by mouth daily.  . fluticasone (FLONASE) 50 MCG/ACT nasal spray Place 1 spray into both nostrils daily as needed for allergies.   Marland Kitchen gabapentin (NEURONTIN) 100 MG capsule Take 100 mg by mouth 3 (three) times daily.  Marland Kitchen glimepiride (AMARYL) 2 MG tablet Take 2 mg by mouth every morning.  . Insulin Pen Needle 32G X 4 MM MISC Use with insulin pen as directed  . LANTUS SOLOSTAR 100 UNIT/ML Solostar Pen Inject 38 Units into the skin at bedtime.   Marland Kitchen lisinopril (PRINIVIL,ZESTRIL) 10 MG tablet Take 10 mg by mouth at bedtime.   . metFORMIN (GLUCOPHAGE) 1000 MG tablet Take 1,000 mg by mouth 2 (two) times daily with a meal.  . pantoprazole (PROTONIX) 40 MG tablet Take 40 mg by mouth daily.   No facility-administered encounter medications on file as of 05/06/2020.    Allergy:  Allergies  Allergen Reactions  . Latex Hives and Rash    Per DR. Gerarda Fraction. Question if this is a latex allergy versus Dermabond   . Other Rash    Dermabond - unsure allergy    Social Hx:   Social History   Socioeconomic History  . Marital status: Married    Spouse name: Richard  . Number of children: Not on file  . Years of education: Not on file  . Highest education level: Not on file  Occupational History  . Occupation: Freight forwarder  Tobacco Use  . Smoking status: Never Smoker  . Smokeless tobacco: Never Used  Vaping Use  . Vaping Use: Never used  Substance and Sexual Activity  . Alcohol use: No   . Drug use: No  . Sexual activity: Yes  Other Topics Concern  . Not on file  Social History Narrative  . Not on file   Social Determinants of Health   Financial Resource Strain:   . Difficulty of Paying Living Expenses: Not on file  Food Insecurity:   . Worried About Charity fundraiser in the Last Year: Not on file  . Ran Out of Food in the Last Year: Not on file  Transportation Needs:   . Lack of Transportation (Medical): Not on file  . Lack of Transportation (Non-Medical): Not on file  Physical Activity:   . Days of Exercise per Week: Not on file  . Minutes of Exercise per Session: Not on file  Stress:   . Feeling of Stress : Not on file  Social Connections:   . Frequency of Communication with Friends and Family: Not on file  . Frequency of Social Gatherings with Friends and Family: Not on file  . Attends Religious Services: Not on file  . Active Member of Clubs or Organizations: Not on file  . Attends Archivist Meetings: Not on file  . Marital Status: Not on file  Intimate Partner  Violence:   . Fear of Current or Ex-Partner: Not on file  . Emotionally Abused: Not on file  . Physically Abused: Not on file  . Sexually Abused: Not on file    Past Surgical Hx:  Past Surgical History:  Procedure Laterality Date  . IR IMAGING GUIDED PORT INSERTION  05/08/2018  . IR REMOVAL TUN ACCESS W/ PORT W/O FL MOD SED  02/05/2019  . LAPAROSCOPIC SALPINGOOPHERECTOMY Right 2019   torsion  . ROBOTIC ASSISTED TOTAL HYSTERECTOMY WITH BILATERAL SALPINGO OOPHERECTOMY N/A 04/24/2018   Procedure: XI ROBOTIC  DIAGNOSTIC LAPAROSCOPY WITH BIOPSY.;- DID NOT HAVE THIS SURGERY!  . ROBOTIC ASSISTED TOTAL HYSTERECTOMY WITH BILATERAL SALPINGO OOPHERECTOMY N/A 09/20/2018   Procedure: XI ROBOTIC ASSISTED TOTAL HYSTERECTOMY WITH LEFT SALPINGO OOPHORECTOMY;  Surgeon: [name], MD;  Location: [facility];  Service: Gynecology;  Laterality: N/A;    Past Medical Hx:  Past Medical History:   Diagnosis Date  . #[id number] 04/2018  . Arthritis   . Asthma   . 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 GERD (gastroesophageal reflux disease)   . Hypertension   . PONV (postoperative nausea and vomiting)     Family Hx:  Family History  Problem Relation Age of Onset  . Diabetes Mother   . Cervical cancer Mother   . Throat cancer Mother 12  . Heart attack Father   . Diabetes Brother   . Ovarian cancer Maternal Aunt 21  . Uterine cancer Maternal Aunt 21  . Breast cancer Maternal Aunt 21  . Epilepsy Paternal Aunt   . Kidney cancer Brother 32  . Ovarian cancer Cousin 22    Vitals:  BP 120/89 (BP Location: Right Arm, Patient Position: Sitting)   Pulse 81   Temp (!) 97.4 F (36.3 C) (Tympanic)   Resp 16   Ht _0  (1.549 m)   Wt 189 lb 6.4 oz (85.9 kg)   LMP 04/07/2018 (Approximate)   SpO2 100%   BMI 35.79 kg/m    Physical Exam: Physical Exam Exam conducted with a chaperone present.  Constitutional:      Appearance: Normal appearance.  Abdominal:     General: There is no distension.     Palpations: Abdomen is soft. There is no mass.     Tenderness: There is no abdominal tenderness.  Genitourinary:    General: Normal vulva.     Exam position: Lithotomy position.     Uterus: Absent.      Adnexa:        Right: No mass or tenderness.         Left: No mass or tenderness.       Comments: Angiokeratomata No palpable abnormality on vaginal exam Musculoskeletal:     Right lower leg: No edema.     Left lower leg: No edema.  Lymphadenopathy:     Upper Body:     Right upper body: No supraclavicular adenopathy.     Left upper body: No supraclavicular adenopathy.     Lower Body: No right inguinal adenopathy. No left inguinal adenopathy.  Neurological:     Mental Status: She is alert.     Assessment/Plan:   Endometrial cancer ([place]) Locally advanced (pelvic peritoneum) Endometrial cancer Negative symptom review and exam  >Repeat  CA-125 today as this has been previously used as part of surveillance for this patinent >Return in three months   I personally spent 25 minutes face-to-face and non-face-to-face in the care of this patient, which  includes all pre, intra, and post visit time on the date of service.  Lahoma Crocker, MD 05/05/2020, 2:48 PM

## 2020-05-05 NOTE — Assessment & Plan Note (Addendum)
Locally advanced (pelvic peritoneum) Endometrial cancer Negative symptom review and exam  >Repeat CA-125 today as this has been previously used as part of surveillance for this patinent >Return in three months

## 2020-05-06 ENCOUNTER — Encounter: Payer: Self-pay | Admitting: Obstetrics & Gynecology

## 2020-05-06 ENCOUNTER — Other Ambulatory Visit: Payer: Self-pay

## 2020-05-06 ENCOUNTER — Inpatient Hospital Stay: Payer: BC Managed Care – PPO | Attending: Obstetrics & Gynecology | Admitting: Obstetrics & Gynecology

## 2020-05-06 ENCOUNTER — Inpatient Hospital Stay: Payer: BC Managed Care – PPO

## 2020-05-06 VITALS — BP 120/89 | HR 81 | Temp 97.4°F | Resp 16 | Ht 61.0 in | Wt 189.4 lb

## 2020-05-06 DIAGNOSIS — K219 Gastro-esophageal reflux disease without esophagitis: Secondary | ICD-10-CM | POA: Diagnosis not present

## 2020-05-06 DIAGNOSIS — Z90710 Acquired absence of both cervix and uterus: Secondary | ICD-10-CM | POA: Insufficient documentation

## 2020-05-06 DIAGNOSIS — Z90721 Acquired absence of ovaries, unilateral: Secondary | ICD-10-CM | POA: Insufficient documentation

## 2020-05-06 DIAGNOSIS — Z794 Long term (current) use of insulin: Secondary | ICD-10-CM | POA: Insufficient documentation

## 2020-05-06 DIAGNOSIS — Z803 Family history of malignant neoplasm of breast: Secondary | ICD-10-CM | POA: Insufficient documentation

## 2020-05-06 DIAGNOSIS — Z833 Family history of diabetes mellitus: Secondary | ICD-10-CM | POA: Insufficient documentation

## 2020-05-06 DIAGNOSIS — Z9079 Acquired absence of other genital organ(s): Secondary | ICD-10-CM | POA: Insufficient documentation

## 2020-05-06 DIAGNOSIS — C7961 Secondary malignant neoplasm of right ovary: Secondary | ICD-10-CM

## 2020-05-06 DIAGNOSIS — Z8041 Family history of malignant neoplasm of ovary: Secondary | ICD-10-CM | POA: Insufficient documentation

## 2020-05-06 DIAGNOSIS — C541 Malignant neoplasm of endometrium: Secondary | ICD-10-CM | POA: Diagnosis present

## 2020-05-06 DIAGNOSIS — Z8249 Family history of ischemic heart disease and other diseases of the circulatory system: Secondary | ICD-10-CM | POA: Diagnosis not present

## 2020-05-06 DIAGNOSIS — E119 Type 2 diabetes mellitus without complications: Secondary | ICD-10-CM | POA: Diagnosis not present

## 2020-05-06 DIAGNOSIS — J45909 Unspecified asthma, uncomplicated: Secondary | ICD-10-CM | POA: Diagnosis not present

## 2020-05-06 DIAGNOSIS — I10 Essential (primary) hypertension: Secondary | ICD-10-CM | POA: Insufficient documentation

## 2020-05-06 DIAGNOSIS — Z8051 Family history of malignant neoplasm of kidney: Secondary | ICD-10-CM | POA: Diagnosis not present

## 2020-05-06 DIAGNOSIS — Z801 Family history of malignant neoplasm of trachea, bronchus and lung: Secondary | ICD-10-CM | POA: Diagnosis not present

## 2020-05-06 DIAGNOSIS — Z79899 Other long term (current) drug therapy: Secondary | ICD-10-CM | POA: Diagnosis not present

## 2020-05-06 DIAGNOSIS — Z8049 Family history of malignant neoplasm of other genital organs: Secondary | ICD-10-CM | POA: Insufficient documentation

## 2020-05-06 LAB — BASIC METABOLIC PANEL
Anion gap: 8 (ref 5–15)
BUN: 31 mg/dL — ABNORMAL HIGH (ref 6–20)
CO2: 32 mmol/L (ref 22–32)
Calcium: 11.2 mg/dL — ABNORMAL HIGH (ref 8.9–10.3)
Chloride: 103 mmol/L (ref 98–111)
Creatinine, Ser: 1.31 mg/dL — ABNORMAL HIGH (ref 0.44–1.00)
GFR calc Af Amer: 59 mL/min — ABNORMAL LOW (ref >60)
GFR calc non Af Amer: 51 mL/min — ABNORMAL LOW (ref >60)
Glucose, Bld: 182 mg/dL — ABNORMAL HIGH (ref 70–99)
Potassium: 4.2 mmol/L (ref 3.5–5.1)
Sodium: 143 mmol/L (ref 135–145)

## 2020-05-06 NOTE — Patient Instructions (Signed)
Return in 3 months.

## 2020-05-07 ENCOUNTER — Telehealth: Payer: Self-pay | Admitting: *Deleted

## 2020-05-07 LAB — CA 125: Cancer Antigen (CA) 125: 5 U/mL (ref 0.0–38.1)

## 2020-05-07 NOTE — Telephone Encounter (Signed)
Left message for Kathryn Stewart to call clinic regarding lab results.

## 2020-05-07 NOTE — Telephone Encounter (Signed)
Kathryn Stewart returned my call. She was notified that her CA 125 was WNL.. We discussed her elevated Blood Gucose, Calcium level and Creatinine.  She states that her PCP is aware of her elevated Calcium levels in the past, her PCP is monitoring her blood sugar levels and adjusting her medications to bring her blood sugar within normal range. I instructed the patient to dink at least 64 oz of water today due to her elevated creatinine and the fact that she had recently had contrast for her CT. She states that she has a f/u appointment with her PCP at the end of the month. I will fax the lab values to her PCP and inquire if they would like to follow up with Kathryn Stewart sooner than end of month.

## 2020-07-12 IMAGING — DX DG CHEST 2V
2 series · 2 of 2 positions shown · non-contrast
Comparison: None.

CLINICAL DATA: Preoperative examination. Patient for hysterectomy
secondary to endometrial carcinoma

EXAM:
CHEST - 2 VIEW

[chest pa]
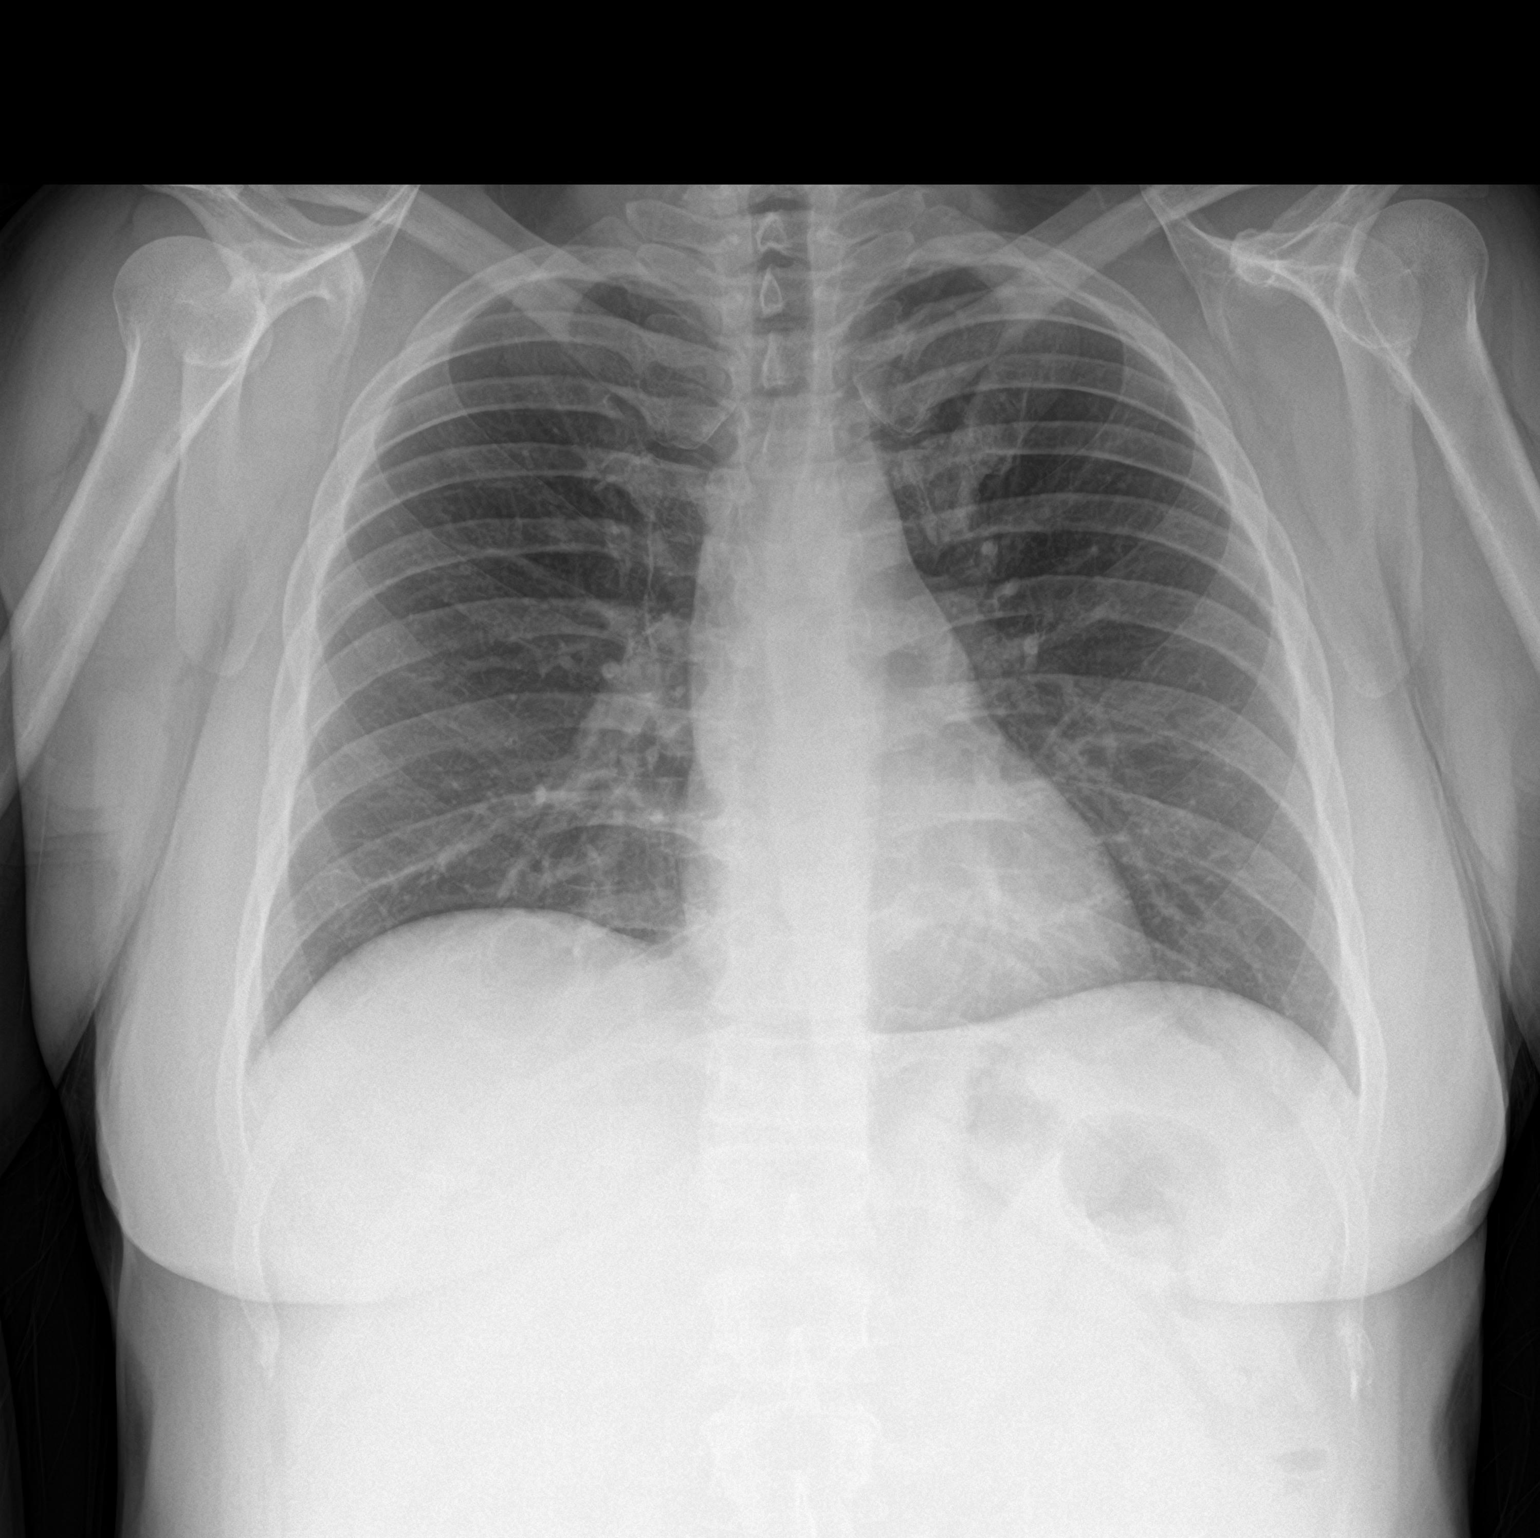

[chest lat]
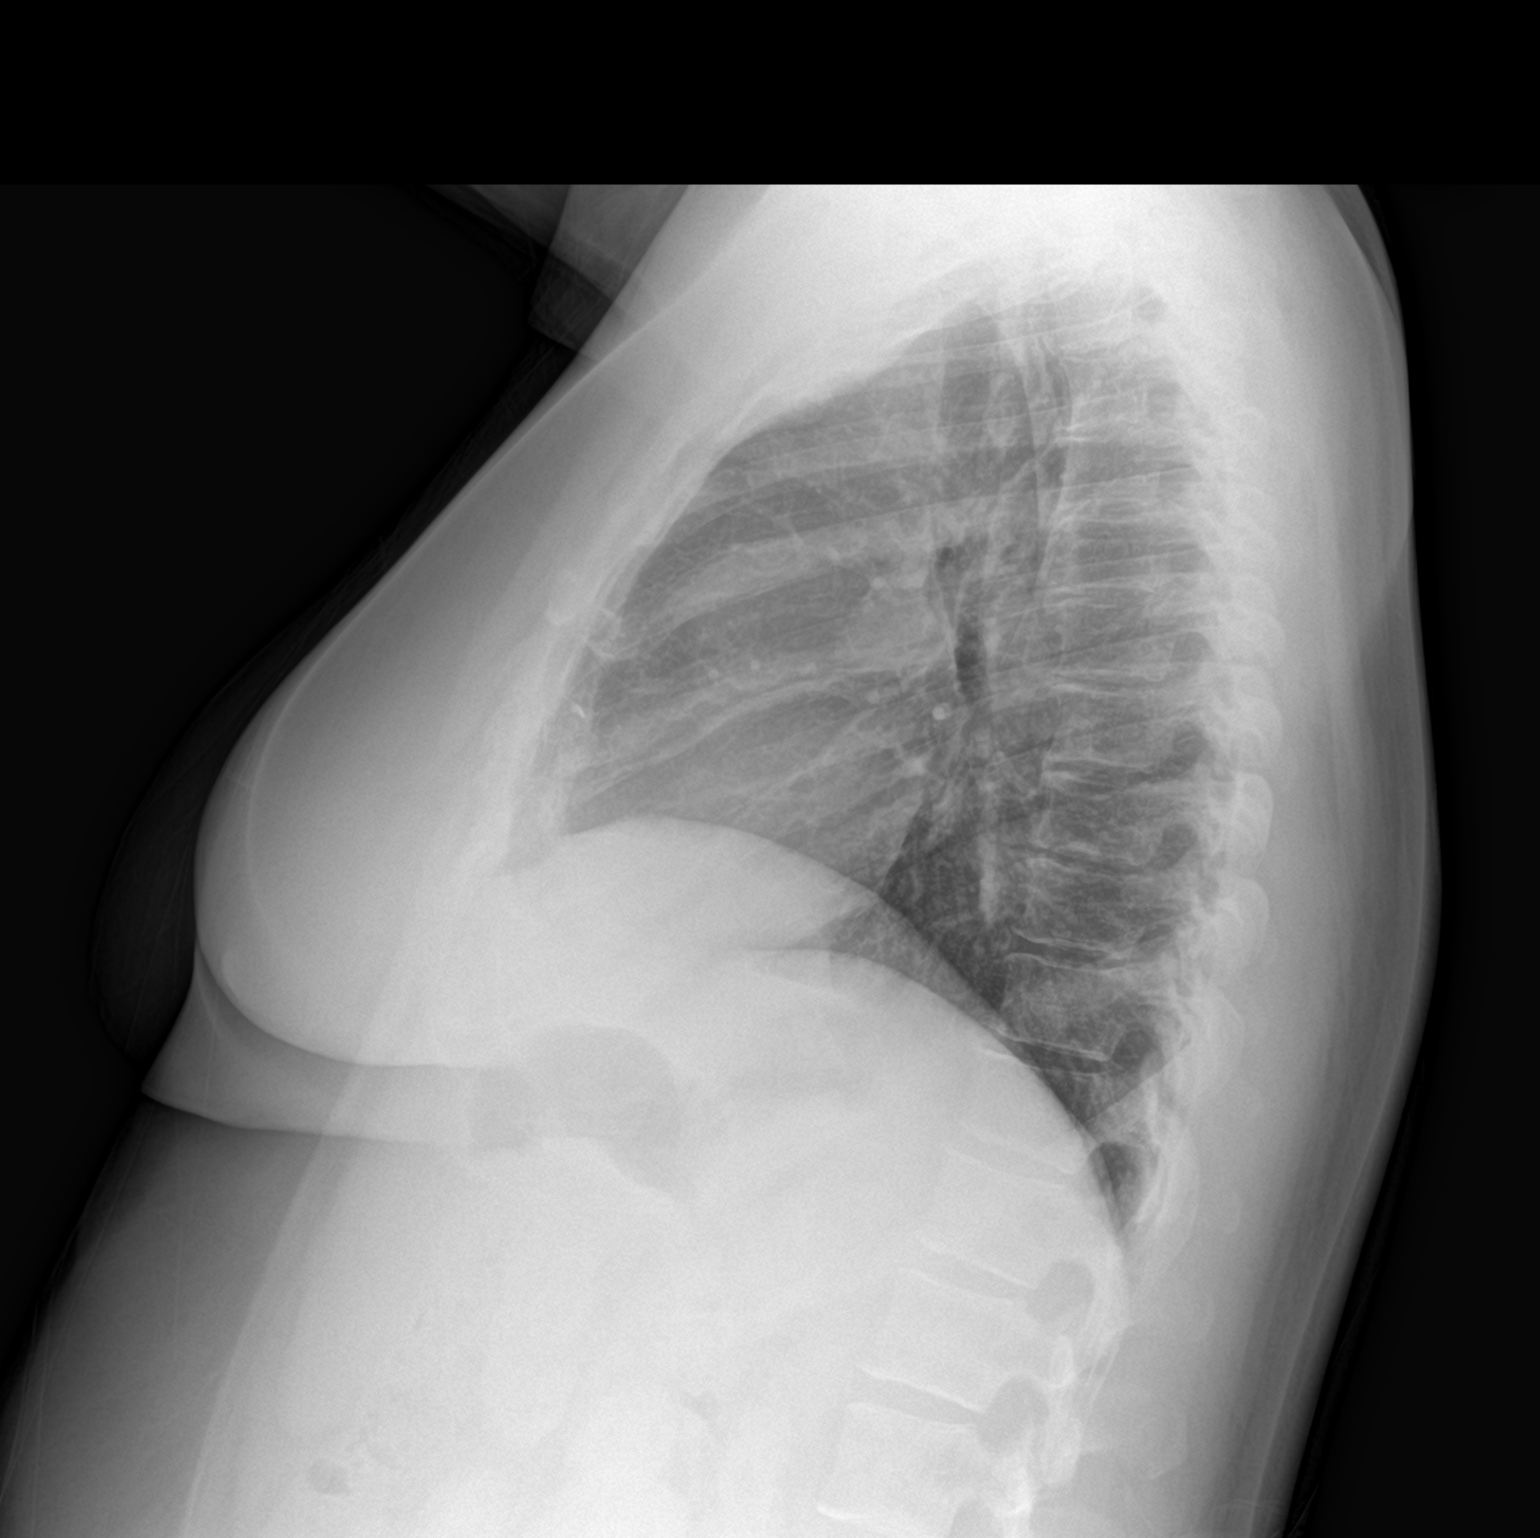

[2 of 2 positions shown; findings below may reference images not displayed]

FINDINGS: Lungs clear. Heart size normal. No pneumothorax or pleural effusion.
No acute or focal bony abnormality.
IMPRESSION: Negative chest.

## 2020-08-04 ENCOUNTER — Encounter: Payer: Self-pay | Admitting: Obstetrics & Gynecology

## 2020-08-04 NOTE — Assessment & Plan Note (Addendum)
Locally advanced EC Negative symptom review and exam  >repeat CA-125 prior to the next visit >return in 6 mths

## 2020-08-04 NOTE — Progress Notes (Signed)
Follow Up Note: Gyn-Onc  Kathryn Stewart 39 y.o. female  CC: She presents for a f/u visit.  HPI: Oncology History Overview Note  Genetics neg   Endometrial cancer ([place])  04/06/2018 Initial Diagnosis   She presented with severe abdominal pain   04/07/2018 Imaging   She presented to the emergency room after the pain woke her up CT imaging was performed 04/07/2018 [facility].  A 6.5 x 7 x 8.1 complex right adnexal mass was found with solid and fat components consistent with a teratoma.  No lymphadenopathy.  Small to moderate ascites and inflammatory changes were seen in the pelvis follow-up ultrasound was also performed showing concern for right ovarian torsion with a large ovarian mass cystic and solid components 9.7 cm.  A left ovarian cyst seen 3.4 cm.  Endometrial thickening with hypervascularity and endometrial thickness of 24 mm moderate pelvic free fluid.   04/07/2018 Surgery   Given the concern for torsion she was taken urgently to the operating room by Dr. Orson Ape. McLeod 04/07/2018.  At that time operative laparoscopy, torsion of the right adnexa was noted along with thickened abnormal endometrium.  She had right salpingo-oophorectomy through a laparoscopic approach along with endometrial biopsy.     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 Biopsy of left  parametrial tissue and sigmoid peritoneum  Surgeon: Bernita Raisin, MD  Specimens: pelvic washings, parametrial biopsy, sigmoid peritoneum biopsy  Complications: none  Indication for Procedure:  Patient found at OSH to have right ovarian mass that was resected and found on permanent section to be c/w endometrial cancer. Also had endometrial biopsy showing the same.  Operative Findings: Significant adhesive disease almost miliary-like. The sigmoid was adherent to the left cornua. After takedown of the sigmoid in this region it was clear the left cornua was involved in an abnormal process including necrotic tissue clinically concerning for malignancy. In addition the bladder was adherent to this region. Posterior surface of the broad ligament in area c/w the parametria on the left showed likely tumor breakthrough. Images were taken. Biopsy from the tissue extruding from the left side of the uterus in the presumed parametrium was sent for frozen. This returned worrisome for disease, but not definitive. A separate area of the sigmoid was adherent inferior to the posterior surface lesion and the remnant of this adhesion was sent as sigmoid peritoneum. This, on frozen, returned with fibrosis.    04/24/2018 Pathology Results   1. Peritoneum, biopsy - METASTATIC CARCINOMA CONSISTENT WITH PATIENT'S CLINICAL HISTORY OF PRIMARY ENDOMETRIAL CARCINOMA. SEE NOTE 2. Soft tissue, biopsy, parametrial - METASTATIC CARCINOMA CONSISTENT WITH PATIENT'S CLINICAL HISTORY OF PRIMARY ENDOMETRIAL CARCINOMA. SEE NOTE Diagnosis Note 1. and 2. Immunohistochemical stains show that the tumor cells are positive for PAX8 and negative for calretinin, consistent with the above diagnosis   04/25/2018 Cancer Staging   Staging form: Corpus Uteri - Carcinoma and Carcinosarcoma, AJCC 8th Edition - Clinical: Stage IVA (cT4, cN0, cM0) - Signed by Heath Lark, MD on 04/25/2018  05/02/2018 Tumor Marker   Patient's tumor was  tested for the following markers: CA-125 Results of the tumor marker test revealed 56.7   05/04/2018 - 08/28/2018 Chemotherapy   The patient had carboplatin and Taxol   05/08/2018 Procedure   Placement of a power injectable Port-A-Cath   05/29/2018 Tumor Marker   Patient's tumor was tested for the following markers: CA-125 Results of the tumor marker test revealed 31.6   06/19/2018 Tumor Marker   Patient's tumor was tested for the following markers: CA-125 Results of the tumor marker test revealed 21.1   06/25/2018 Genetic Testing   Patient has genetic testing done for 64 genes See report under Media on 06/25/2018 Pathology molecular report Results revealed patient has the following mutation(s): none   06/28/2018 Imaging   No evidence of malignancy or other acute findings within the abdomen or pelvis.  Moderate hepatic steatosis.   07/10/2018 Tumor Marker   Patient's tumor was tested for the following markers: CA-125 Results of the tumor marker test revealed 12.6   09/20/2018 Pathology Results   1. Soft tissue, biopsy, right broad ligament nodule - BENIGN FIBROUS TISSUE WITH FOREIGN BODY GIANT CELL REACTION. - THERE IS NO EVIDENCE OF MALIGNANCY. 2. Soft tissue, biopsy, uterine serosal nodule - BENIGN FIBROUS TISSUE WITH FOREIGN BODY GIANT CELL REACTION. - THERE IS NO EVIDENCE OF MALIGNANCY. 3. Uterus and cervix, left tube and ovary - ENDOMETRIUM: ENDOMETRIOID ADENOCARCINOMA, FIGO GRADE I/III, SCATTERED MICROSCOPIC FOCI. - ADENOCARCINOMA INVOLVES THE INNER HALF OF THE MYOMETRIUM. - MYOMETRIUM: LEIOMYOMA. - SEROSA: ADHESIONS. - RIGHT FALLOPIAN TUBE: UNREMARKABLE. - LEFT ADNEXA: ENDOMETRIOID ADENOCARCINOMA INVOLVING THE OVARY. - BENIGN FALLOPIAN TUBE. - SEE ONCOLOGY TABLE BELOW. Microscopic Comment 3. UTERUS, CARCINOMA OR CARCINOSARCOMA Procedure: Hysterectomy, bilateral fallopian tube resection, and left ovarian oophorectomy. Histologic type: Endometrioid  adenocarcinoma. Histologic Grade: FIGO Grade I Myometrial invasion: Depth of invasion: 2 mm Myometrial thickness: 15 mm Uterine Serosa Involvement: Not identified Cervical stromal involvement: Not identified Extent of involvement of other organs: Involves the left ovary Lymphovascular invasion: Present Regional Lymph Nodes: None examined MMR testing by IHC will be performed on block 3-N Pathologic Stage Classification (pTNM, AJCC 8th edition): pT3, pNX FIGO Stage: At least FIGO Stage III-A   09/20/2018 Surgery   Procedure(s) Performed:  1. Robotic-assisted laparoscopic hysterectomy, left salpingoophorectomy 2. Lysis of adhesions ~40 minutes  Surgeon: Bernita Raisin, MD  Specimens: Uterus with attached cervix, left tube/ovary, right IP ligament  Operative Findings: Adhesive disease of colon to left sidewall/left adnexa (~30 min lysis of adhesions). Filmy adhesions of right colon to culdesac. Residual right IP ligament. Small areas of questionable peritoneal disease x 2 removed and sent for pathology. No other gross residual disease. There was adhesive disease between the bladder and the cervix/LUS.   10/31/2018 Tumor Marker   Patient's tumor was tested for the following markers: CA-125 Results of the tumor marker test revealed 5.6   12/17/2018 Imaging   1. No evidence of recurrent or metastatic carcinoma within the abdomen or pelvis. 2. Stable hepatic steatosis.   02/05/2019 Procedure   Successful removal of implanted Port-A-Cath.   11/06/2019 Tumor Marker   Patient's tumor was tested for the following markers: CA-125.  Results of the tumor marker test revealed 4.5.   05/06/2020 Tumor Marker   Patient's tumor was tested for the following markers: CA 125. Results of the tumor marker test revealed 5.0.    Interval History: She denies any vaginal bleeding, abdominal/pelvic pain, cough, lethargy or abdominal distention.  Review of Systems  Review of Systems  Constitutional:  Negative for malaise/fatigue.  Respiratory: Negative for cough.   Gastrointestinal: Negative for abdominal pain.  Genitourinary:       Negative for vaginal bleeding    Current Meds:  Outpatient Encounter Medications as of 08/05/2020  Medication Sig  . albuterol (VENTOLIN HFA) 108 (90 Base) MCG/ACT inhaler INHALE 2 PUFFS BY MOUTH EVERY 4 TO 6 HOURS AS NEEDED  . atorvastatin (LIPITOR) 20 MG tablet Take 20 mg by mouth daily.  . BD PEN NEEDLE NANO U/F 32G X 4 MM MISC USE WITH INSULIN PEN AS DIRECTED  . cetirizine (ZYRTEC) 10 MG tablet Take 10 mg by mouth daily.  Marland Kitchen escitalopram (LEXAPRO) 20 MG tablet Take 20 mg by mouth daily.  Marland Kitchen FARXIGA 10 MG TABS tablet Take 10 mg by mouth daily.  . fenofibrate (TRICOR) 145 MG tablet Take 145 mg by mouth daily.  . fluticasone (FLONASE) 50 MCG/ACT nasal spray Place 1 spray into both nostrils daily as needed for allergies.   Marland Kitchen gabapentin (NEURONTIN) 100 MG capsule Take 200 mg by mouth at bedtime.  Marland Kitchen glimepiride (AMARYL) 4 MG tablet Take 4 mg by mouth in the morning.  . Insulin Pen Needle 32G X 4 MM MISC Use with insulin pen as directed  . LANTUS SOLOSTAR 100 UNIT/ML Solostar Pen Inject 50 Units into the skin at bedtime.   Marland Kitchen lisinopril (PRINIVIL,ZESTRIL) 10 MG tablet Take 10 mg by mouth at bedtime.   . metFORMIN (GLUCOPHAGE) 1000 MG tablet Take 1,000 mg by mouth 2 (two) times daily with a meal.  . pantoprazole (PROTONIX) 40 MG tablet Take 40 mg by mouth daily.  . [DISCONTINUED] gabapentin (NEURONTIN) 100 MG capsule Take 100 mg by mouth 3 (three) times daily.   No facility-administered encounter medications on file as of 08/05/2020.    Allergy:  Allergies  Allergen Reactions  . Latex Hives and Rash    Per DR. Gerarda Fraction. Question if this is a latex allergy versus Dermabond   . Other Rash    Dermabond - unsure allergy    Social Hx:   Social History   Socioeconomic History  . Marital status: Married    Spouse name: Richard  . Number of children: Not on  file  . Years of education: Not on file  . Highest education level: Not on file  Occupational History  . Occupation: Freight forwarder  Tobacco Use  . Smoking status: Never Smoker  . Smokeless tobacco: Never Used  Vaping Use  . Vaping Use: Never used  Substance and Sexual Activity  . Alcohol use: No  . Drug use: No  . Sexual activity: Yes  Other Topics Concern  . Not on file  Social History Narrative  . Not on file   Social Determinants of Health   Financial Resource Strain:   . Difficulty of Paying Living Expenses: Not on file  Food Insecurity:   . Worried About Charity fundraiser in the Last Year: Not on file  . Ran Out of Food in the Last Year: Not on file  Transportation Needs:   . Lack of Transportation (Medical): Not on file  . Lack of Transportation (Non-Medical): Not on file  Physical Activity:   . Days of Exercise per Week: Not on file  . Minutes of Exercise per Session: Not on file  Stress:   . Feeling of Stress : Not on file  Social Connections:   . Frequency of Communication with Friends and Family: Not  on file  . Frequency of Social Gatherings with Friends and Family: Not on file  . Attends Religious Services: Not on file  . Active Member of Clubs or Organizations: Not on file  . Attends Archivist Meetings: Not on file  . Marital Status: Not on file  Intimate Partner Violence:   . Fear of Current or Ex-Partner: Not on file  . Emotionally Abused: Not on file  . Physically Abused: Not on file  . Sexually Abused: Not on file    Past Surgical Hx:  Past Surgical History:  Procedure Laterality Date  . IR IMAGING GUIDED PORT INSERTION  05/08/2018  . IR REMOVAL TUN ACCESS W/ PORT W/O FL MOD SED  02/05/2019  . LAPAROSCOPIC SALPINGOOPHERECTOMY Right 2019   torsion  . ROBOTIC ASSISTED TOTAL HYSTERECTOMY WITH BILATERAL SALPINGO OOPHERECTOMY N/A 04/24/2018   Procedure: XI ROBOTIC  DIAGNOSTIC LAPAROSCOPY WITH BIOPSY.;- DID NOT HAVE THIS SURGERY!  . ROBOTIC  ASSISTED TOTAL HYSTERECTOMY WITH BILATERAL SALPINGO OOPHERECTOMY N/A 09/20/2018   Procedure: XI ROBOTIC ASSISTED TOTAL HYSTERECTOMY WITH LEFT SALPINGO OOPHORECTOMY;  Surgeon: [name], MD;  Location: [facility];  Service: Gynecology;  Laterality: N/A;    Past Medical Hx:  Past Medical History:  Diagnosis Date  . #[id number] 04/2018  . Arthritis   . Asthma   . 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 GERD (gastroesophageal reflux disease)   . Hypertension   . PONV (postoperative nausea and vomiting)     Family Hx:  Family History  Problem Relation Age of Onset  . Diabetes Mother   . Cervical cancer Mother   . Throat cancer Mother 20  . Heart attack Father   . Diabetes Brother   . Ovarian cancer Maternal Aunt 21  . Uterine cancer Maternal Aunt 21  . Breast cancer Maternal Aunt 21  . Epilepsy Paternal Aunt   . Kidney cancer Brother 84  . Ovarian cancer Cousin 22    Vitals: BP 121/82 (BP Location: Left Arm, Patient Position: Sitting)   Pulse 80   Temp (!) 96.9 F (36.1 C) (Tympanic)   Resp 16   Ht _0  (1.549 m)   Wt 189 lb (85.7 kg)   LMP 04/07/2018 (Approximate)   SpO2 100% Comment: RA  BMI 35.71 kg/m    Physical Exam: Physical Exam Exam conducted with a chaperone present.  Constitutional:      Appearance: Normal appearance.  Abdominal:     General: There is no distension.     Palpations: Abdomen is soft. There is no mass.     Tenderness: There is no abdominal tenderness.  Genitourinary:    General: Normal vulva.     Exam position: Lithotomy position.     Uterus: Absent.      Adnexa:        Right: No mass or tenderness.         Left: No mass or tenderness.       Comments: Angiokeratomata No palpable abnormality on vaginal exam Musculoskeletal:     Right lower leg: No edema.     Left lower leg: No edema.  Lymphadenopathy:     Upper Body:     Right upper body: No supraclavicular adenopathy.     Left upper body: No  supraclavicular adenopathy.     Lower Body: Right inguinal adenopathy present. Left inguinal adenopathy present.  Neurological:     Mental Status: She is alert.     Assessment/Plan:  Endometrial cancer ([place]) Locally advanced EC Negative symptom review and exam  >repeat CA-125 prior to the next visit >return in 6 mths   I personally spent 25 minutes face-to-face and non-face-to-face in the care of this patient, which includes all pre, intra, and post visit time on the date of service.  Lahoma Crocker, MD 08/05/2020, 1:24 PM

## 2020-08-05 ENCOUNTER — Other Ambulatory Visit: Payer: Self-pay

## 2020-08-05 ENCOUNTER — Inpatient Hospital Stay: Payer: BC Managed Care – PPO | Attending: Obstetrics & Gynecology | Admitting: Obstetrics & Gynecology

## 2020-08-05 DIAGNOSIS — M1990 Unspecified osteoarthritis, unspecified site: Secondary | ICD-10-CM | POA: Insufficient documentation

## 2020-08-05 DIAGNOSIS — Z8542 Personal history of malignant neoplasm of other parts of uterus: Secondary | ICD-10-CM | POA: Diagnosis not present

## 2020-08-05 DIAGNOSIS — Z90710 Acquired absence of both cervix and uterus: Secondary | ICD-10-CM | POA: Diagnosis not present

## 2020-08-05 DIAGNOSIS — K219 Gastro-esophageal reflux disease without esophagitis: Secondary | ICD-10-CM | POA: Insufficient documentation

## 2020-08-05 DIAGNOSIS — Z794 Long term (current) use of insulin: Secondary | ICD-10-CM | POA: Diagnosis not present

## 2020-08-05 DIAGNOSIS — E119 Type 2 diabetes mellitus without complications: Secondary | ICD-10-CM | POA: Insufficient documentation

## 2020-08-05 DIAGNOSIS — J45909 Unspecified asthma, uncomplicated: Secondary | ICD-10-CM | POA: Insufficient documentation

## 2020-08-05 DIAGNOSIS — I10 Essential (primary) hypertension: Secondary | ICD-10-CM | POA: Diagnosis not present

## 2020-08-05 DIAGNOSIS — K760 Fatty (change of) liver, not elsewhere classified: Secondary | ICD-10-CM | POA: Insufficient documentation

## 2020-08-05 DIAGNOSIS — C541 Malignant neoplasm of endometrium: Secondary | ICD-10-CM | POA: Diagnosis present

## 2020-08-05 DIAGNOSIS — Z08 Encounter for follow-up examination after completed treatment for malignant neoplasm: Secondary | ICD-10-CM | POA: Insufficient documentation

## 2020-08-05 DIAGNOSIS — Z90722 Acquired absence of ovaries, bilateral: Secondary | ICD-10-CM | POA: Diagnosis not present

## 2020-08-05 DIAGNOSIS — Z79899 Other long term (current) drug therapy: Secondary | ICD-10-CM | POA: Diagnosis not present

## 2020-08-05 NOTE — Patient Instructions (Signed)
Return in 6 months

## 2020-09-28 ENCOUNTER — Encounter (INDEPENDENT_AMBULATORY_CARE_PROVIDER_SITE_OTHER): Payer: BC Managed Care – PPO | Admitting: Ophthalmology

## 2020-09-28 ENCOUNTER — Other Ambulatory Visit: Payer: Self-pay

## 2020-09-28 DIAGNOSIS — E113313 Type 2 diabetes mellitus with moderate nonproliferative diabetic retinopathy with macular edema, bilateral: Secondary | ICD-10-CM | POA: Diagnosis not present

## 2020-09-28 DIAGNOSIS — I10 Essential (primary) hypertension: Secondary | ICD-10-CM

## 2020-09-28 DIAGNOSIS — H43813 Vitreous degeneration, bilateral: Secondary | ICD-10-CM

## 2020-09-28 DIAGNOSIS — H35033 Hypertensive retinopathy, bilateral: Secondary | ICD-10-CM | POA: Diagnosis not present

## 2020-10-28 ENCOUNTER — Encounter (INDEPENDENT_AMBULATORY_CARE_PROVIDER_SITE_OTHER): Payer: BC Managed Care – PPO | Admitting: Ophthalmology

## 2020-10-28 ENCOUNTER — Other Ambulatory Visit: Payer: Self-pay

## 2020-10-28 DIAGNOSIS — I10 Essential (primary) hypertension: Secondary | ICD-10-CM

## 2020-10-28 DIAGNOSIS — H35033 Hypertensive retinopathy, bilateral: Secondary | ICD-10-CM

## 2020-10-28 DIAGNOSIS — E113313 Type 2 diabetes mellitus with moderate nonproliferative diabetic retinopathy with macular edema, bilateral: Secondary | ICD-10-CM | POA: Diagnosis not present

## 2020-10-28 DIAGNOSIS — H43813 Vitreous degeneration, bilateral: Secondary | ICD-10-CM | POA: Diagnosis not present

## 2020-11-25 ENCOUNTER — Other Ambulatory Visit: Payer: Self-pay

## 2020-11-25 ENCOUNTER — Encounter (INDEPENDENT_AMBULATORY_CARE_PROVIDER_SITE_OTHER): Payer: BC Managed Care – PPO | Admitting: Ophthalmology

## 2020-11-25 DIAGNOSIS — I10 Essential (primary) hypertension: Secondary | ICD-10-CM

## 2020-11-25 DIAGNOSIS — H43813 Vitreous degeneration, bilateral: Secondary | ICD-10-CM

## 2020-11-25 DIAGNOSIS — E113313 Type 2 diabetes mellitus with moderate nonproliferative diabetic retinopathy with macular edema, bilateral: Secondary | ICD-10-CM

## 2020-11-25 DIAGNOSIS — H35033 Hypertensive retinopathy, bilateral: Secondary | ICD-10-CM | POA: Diagnosis not present

## 2020-12-30 ENCOUNTER — Encounter (INDEPENDENT_AMBULATORY_CARE_PROVIDER_SITE_OTHER): Payer: BC Managed Care – PPO | Admitting: Ophthalmology

## 2020-12-30 ENCOUNTER — Other Ambulatory Visit: Payer: Self-pay

## 2020-12-30 DIAGNOSIS — E113312 Type 2 diabetes mellitus with moderate nonproliferative diabetic retinopathy with macular edema, left eye: Secondary | ICD-10-CM

## 2020-12-30 DIAGNOSIS — H35033 Hypertensive retinopathy, bilateral: Secondary | ICD-10-CM

## 2020-12-30 DIAGNOSIS — H43813 Vitreous degeneration, bilateral: Secondary | ICD-10-CM

## 2020-12-30 DIAGNOSIS — E113291 Type 2 diabetes mellitus with mild nonproliferative diabetic retinopathy without macular edema, right eye: Secondary | ICD-10-CM

## 2020-12-30 DIAGNOSIS — I10 Essential (primary) hypertension: Secondary | ICD-10-CM | POA: Diagnosis not present

## 2021-01-27 ENCOUNTER — Encounter (INDEPENDENT_AMBULATORY_CARE_PROVIDER_SITE_OTHER): Payer: BC Managed Care – PPO | Admitting: Ophthalmology

## 2021-01-27 ENCOUNTER — Other Ambulatory Visit: Payer: Self-pay

## 2021-01-27 ENCOUNTER — Inpatient Hospital Stay: Payer: BC Managed Care – PPO | Attending: Ophthalmology

## 2021-01-27 DIAGNOSIS — H35033 Hypertensive retinopathy, bilateral: Secondary | ICD-10-CM | POA: Diagnosis not present

## 2021-01-27 DIAGNOSIS — C541 Malignant neoplasm of endometrium: Secondary | ICD-10-CM | POA: Insufficient documentation

## 2021-01-27 DIAGNOSIS — E113291 Type 2 diabetes mellitus with mild nonproliferative diabetic retinopathy without macular edema, right eye: Secondary | ICD-10-CM | POA: Diagnosis not present

## 2021-01-27 DIAGNOSIS — H43813 Vitreous degeneration, bilateral: Secondary | ICD-10-CM

## 2021-01-27 DIAGNOSIS — I10 Essential (primary) hypertension: Secondary | ICD-10-CM

## 2021-01-27 DIAGNOSIS — E113312 Type 2 diabetes mellitus with moderate nonproliferative diabetic retinopathy with macular edema, left eye: Secondary | ICD-10-CM

## 2021-01-28 ENCOUNTER — Encounter: Payer: Self-pay | Admitting: Obstetrics & Gynecology

## 2021-01-28 LAB — CA 125: Cancer Antigen (CA) 125: 5.7 U/mL (ref 0.0–38.1)

## 2021-02-02 NOTE — Assessment & Plan Note (Signed)
Locally advanced EC Negative symptom review and exam, normal tumor markers  >repeat CA-125 prior to next visit >return in 6 mos

## 2021-02-02 NOTE — Progress Notes (Signed)
Follow Up Note: Gyn-Onc  Kathryn Stewart 40 y.o. female  CC: She presents for a f/u visit.  HPI: Oncology History Overview Note  Genetics neg   Endometrial cancer ([place])  04/06/2018 Initial Diagnosis   She presented with severe abdominal pain   04/07/2018 Imaging   She presented to the emergency room after the pain woke her up CT imaging was performed 04/07/2018 [facility].  A 6.5 x 7 x 8.1 complex right adnexal mass was found with solid and fat components consistent with a teratoma.  No lymphadenopathy.  Small to moderate ascites and inflammatory changes were seen in the pelvis follow-up ultrasound was also performed showing concern for right ovarian torsion with a large ovarian mass cystic and solid components 9.7 cm.  A left ovarian cyst seen 3.4 cm.  Endometrial thickening with hypervascularity and endometrial thickness of 24 mm moderate pelvic free fluid.   04/07/2018 Surgery   Given the concern for torsion she was taken urgently to the operating room by Dr. Orson Ape. McLeod 04/07/2018.  At that time operative laparoscopy, torsion of the right adnexa was noted along with thickened abnormal endometrium.  She had right salpingo-oophorectomy through a laparoscopic approach along with endometrial biopsy.     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 Biopsy of left  parametrial tissue and sigmoid peritoneum  Surgeon: Bernita Raisin, MD  Specimens: pelvic washings, parametrial biopsy, sigmoid peritoneum biopsy  Complications: none  Indication for Procedure:  Patient found at OSH to have right ovarian mass that was resected and found on permanent section to be c/w endometrial cancer. Also had endometrial biopsy showing the same.  Operative Findings: Significant adhesive disease almost miliary-like. The sigmoid was adherent to the left cornua. After takedown of the sigmoid in this region it was clear the left cornua was involved in an abnormal process including necrotic tissue clinically concerning for malignancy. In addition the bladder was adherent to this region. Posterior surface of the broad ligament in area c/w the parametria on the left showed likely tumor breakthrough. Images were taken. Biopsy from the tissue extruding from the left side of the uterus in the presumed parametrium was sent for frozen. This returned worrisome for disease, but not definitive. A separate area of the sigmoid was adherent inferior to the posterior surface lesion and the remnant of this adhesion was sent as sigmoid peritoneum. This, on frozen, returned with fibrosis.    04/24/2018 Pathology Results   1. Peritoneum, biopsy - METASTATIC CARCINOMA CONSISTENT WITH PATIENT'S CLINICAL HISTORY OF PRIMARY ENDOMETRIAL CARCINOMA. SEE NOTE 2. Soft tissue, biopsy, parametrial - METASTATIC CARCINOMA CONSISTENT WITH PATIENT'S CLINICAL HISTORY OF PRIMARY ENDOMETRIAL CARCINOMA. SEE NOTE Diagnosis Note 1. and 2. Immunohistochemical stains show that the tumor cells are positive for PAX8 and negative for calretinin, consistent with the above diagnosis   04/25/2018 Cancer Staging   Staging form: Corpus Uteri - Carcinoma and Carcinosarcoma, AJCC 8th Edition - Clinical: Stage IVA (cT4, cN0, cM0) - Signed by Heath Lark, MD on 04/25/2018  05/02/2018 Tumor Marker   Patient's tumor was  tested for the following markers: CA-125 Results of the tumor marker test revealed 56.7   05/04/2018 - 08/28/2018 Chemotherapy   The patient had carboplatin and Taxol   05/08/2018 Procedure   Placement of a power injectable Port-A-Cath   05/29/2018 Tumor Marker   Patient's tumor was tested for the following markers: CA-125 Results of the tumor marker test revealed 31.6   06/19/2018 Tumor Marker   Patient's tumor was tested for the following markers: CA-125 Results of the tumor marker test revealed 21.1   06/25/2018 Genetic Testing   Patient has genetic testing done for 64 genes See report under Media on 06/25/2018 Pathology molecular report Results revealed patient has the following mutation(s): none   06/28/2018 Imaging   No evidence of malignancy or other acute findings within the abdomen or pelvis.  Moderate hepatic steatosis.   07/10/2018 Tumor Marker   Patient's tumor was tested for the following markers: CA-125 Results of the tumor marker test revealed 12.6   09/20/2018 Pathology Results   1. Soft tissue, biopsy, right broad ligament nodule - BENIGN FIBROUS TISSUE WITH FOREIGN BODY GIANT CELL REACTION. - THERE IS NO EVIDENCE OF MALIGNANCY. 2. Soft tissue, biopsy, uterine serosal nodule - BENIGN FIBROUS TISSUE WITH FOREIGN BODY GIANT CELL REACTION. - THERE IS NO EVIDENCE OF MALIGNANCY. 3. Uterus and cervix, left tube and ovary - ENDOMETRIUM: ENDOMETRIOID ADENOCARCINOMA, FIGO GRADE I/III, SCATTERED MICROSCOPIC FOCI. - ADENOCARCINOMA INVOLVES THE INNER HALF OF THE MYOMETRIUM. - MYOMETRIUM: LEIOMYOMA. - SEROSA: ADHESIONS. - RIGHT FALLOPIAN TUBE: UNREMARKABLE. - LEFT ADNEXA: ENDOMETRIOID ADENOCARCINOMA INVOLVING THE OVARY. - BENIGN FALLOPIAN TUBE. - SEE ONCOLOGY TABLE BELOW. Microscopic Comment 3. UTERUS, CARCINOMA OR CARCINOSARCOMA Procedure: Hysterectomy, bilateral fallopian tube resection, and left ovarian oophorectomy. Histologic type: Endometrioid  adenocarcinoma. Histologic Grade: FIGO Grade I Myometrial invasion: Depth of invasion: 2 mm Myometrial thickness: 15 mm Uterine Serosa Involvement: Not identified Cervical stromal involvement: Not identified Extent of involvement of other organs: Involves the left ovary Lymphovascular invasion: Present Regional Lymph Nodes: None examined MMR testing by IHC will be performed on block 3-N Pathologic Stage Classification (pTNM, AJCC 8th edition): pT3, pNX FIGO Stage: At least FIGO Stage III-A   09/20/2018 Surgery   Procedure(s) Performed:  1. Robotic-assisted laparoscopic hysterectomy, left salpingoophorectomy 2. Lysis of adhesions ~40 minutes  Surgeon: Bernita Raisin, MD  Specimens: Uterus with attached cervix, left tube/ovary, right IP ligament  Operative Findings: Adhesive disease of colon to left sidewall/left adnexa (~30 min lysis of adhesions). Filmy adhesions of right colon to culdesac. Residual right IP ligament. Small areas of questionable peritoneal disease x 2 removed and sent for pathology. No other gross residual disease. There was adhesive disease between the bladder and the cervix/LUS.   10/31/2018 Tumor Marker   Patient's tumor was tested for the following markers: CA-125 Results of the tumor marker test revealed 5.6   12/17/2018 Imaging   1. No evidence of recurrent or metastatic carcinoma within the abdomen or pelvis. 2. Stable hepatic steatosis.   02/05/2019 Procedure   Successful removal of implanted Port-A-Cath.   11/06/2019 Tumor Marker   Patient's tumor was tested for the following markers: CA-125.  Results of the tumor marker test revealed 4.5.   05/06/2020 Tumor Marker   Patient's tumor was tested for the following markers: CA 125. Results of the tumor marker test revealed 5.0.    Interval History: She denies any vaginal bleeding, abdominal/pelvic pain, cough, lethargy or abdominal distention.  Review of Systems  Review of Systems  Constitutional:  Negative for malaise/fatigue.  Respiratory: Negative for cough.   Gastrointestinal: Negative for abdominal pain.  Genitourinary:       Negative for vaginal bleeding    Current Meds:  Outpatient Encounter Medications as of 02/03/2021  Medication Sig  . albuterol (VENTOLIN HFA) 108 (90 Base) MCG/ACT inhaler INHALE 2 PUFFS BY MOUTH EVERY 4 TO 6 HOURS AS NEEDED  . atorvastatin (LIPITOR) 20 MG tablet Take 20 mg by mouth daily.  . BD PEN NEEDLE NANO U/F 32G X 4 MM MISC USE WITH INSULIN PEN AS DIRECTED  . BESIVANCE 0.6 % SUSP Apply to eye. After monthly eye injections.  . cetirizine (ZYRTEC) 10 MG tablet Take 10 mg by mouth daily.  Marland Kitchen escitalopram (LEXAPRO) 20 MG tablet Take 20 mg by mouth daily.  Marland Kitchen FARXIGA 10 MG TABS tablet Take 10 mg by mouth daily.  . fenofibrate (TRICOR) 145 MG tablet Take 145 mg by mouth daily.  . fluticasone (FLONASE) 50 MCG/ACT nasal spray Place 1 spray into both nostrils daily as needed for allergies.   Marland Kitchen gabapentin (NEURONTIN) 100 MG capsule Take 200 mg by mouth at bedtime.  Marland Kitchen GLIMEPIRIDE PO Take 8 mg by mouth in the morning.  . Insulin Pen Needle 32G X 4 MM MISC Use with insulin pen as directed  . LANTUS SOLOSTAR 100 UNIT/ML Solostar Pen Inject 90 Units into the skin at bedtime.  Marland Kitchen lisinopril (PRINIVIL,ZESTRIL) 10 MG tablet Take 10 mg by mouth at bedtime.   . metFORMIN (GLUCOPHAGE) 1000 MG tablet Take 1,000 mg by mouth 2 (two) times daily with a meal.  . pantoprazole (PROTONIX) 40 MG tablet Take 40 mg by mouth daily.   No facility-administered encounter medications on file as of 02/03/2021.    Allergy:  Allergies  Allergen Reactions  . Latex Hives and Rash    Per DR. Gerarda Fraction. Question if this is a latex allergy versus Dermabond   . Other Rash    Dermabond - unsure allergy    Social Hx:   Social History   Socioeconomic History  . Marital status: Married    Spouse name: Richard  . Number of children: Not on file  . Years of education: Not on file  . Highest  education level: Not on file  Occupational History  . Occupation: Freight forwarder  Tobacco Use  . Smoking status: Never Smoker  . Smokeless tobacco: Never Used  Vaping Use  . Vaping Use: Never used  Substance and Sexual Activity  . Alcohol use: No  . Drug use: No  . Sexual activity: Yes  Other Topics Concern  . Not on file  Social History Narrative  . Not on file   Social Determinants of Health   Financial Resource Strain: Not on file  Food Insecurity: Not on file  Transportation Needs: Not on file  Physical Activity: Not on file  Stress: Not on file  Social Connections: Not on file  Intimate Partner Violence: Not on file    Past Surgical Hx:  Past Surgical History:  Procedure Laterality Date  . IR IMAGING GUIDED PORT INSERTION  05/08/2018  . IR REMOVAL TUN ACCESS W/ PORT W/O FL MOD SED  02/05/2019  . LAPAROSCOPIC SALPINGOOPHERECTOMY Right 2019   torsion  . ROBOTIC ASSISTED TOTAL HYSTERECTOMY WITH BILATERAL SALPINGO OOPHERECTOMY N/A 04/24/2018   Procedure: XI ROBOTIC  DIAGNOSTIC LAPAROSCOPY WITH BIOPSY.;- DID NOT HAVE THIS SURGERY!  . ROBOTIC ASSISTED TOTAL HYSTERECTOMY WITH BILATERAL SALPINGO OOPHERECTOMY N/A  09/20/2018   Procedure: XI ROBOTIC ASSISTED TOTAL HYSTERECTOMY WITH LEFT SALPINGO OOPHORECTOMY;  Surgeon: [name], MD;  Location: [facility];  Service: Gynecology;  Laterality: N/A;    Past Medical Hx:  Past Medical History:  Diagnosis Date  . #[id number] 04/2018  . Arthritis   . Asthma   . 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 GERD (gastroesophageal reflux disease)   . Hypertension   . PONV (postoperative nausea and vomiting)     Family Hx:  Family History  Problem Relation Age of Onset  . Diabetes Mother   . Cervical cancer Mother   . Throat cancer Mother 67  . Heart attack Father   . Diabetes Brother   . Ovarian cancer Maternal Aunt 21  . Uterine cancer Maternal Aunt 21  . Breast cancer Maternal Aunt 21  . Epilepsy Paternal  Aunt   . Kidney cancer Brother 64  . Ovarian cancer Cousin 22    Vitals: BP 124/84 (BP Location: Right Arm, Patient Position: Sitting)   Pulse 84   Temp 98.8 F (37.1 C) (Oral)   Resp 16   Wt 198 lb 12.8 oz (90.2 kg)   LMP 04/07/2018 (Approximate)   SpO2 100%   BMI 37.56 kg/m     Physical Exam: Physical Exam Exam conducted with a chaperone present.  Constitutional:      Appearance: Normal appearance.  Chest:  Breasts:     Right: No supraclavicular adenopathy.     Left: No supraclavicular adenopathy.    Abdominal:     General: There is no distension.     Palpations: Abdomen is soft. There is no mass.     Tenderness: There is no abdominal tenderness.  Genitourinary:    General: Normal vulva.     Exam position: Lithotomy position.     Uterus: Absent.      Adnexa:        Right: No mass or tenderness.         Left: No mass or tenderness.       Comments: Angiokeratomata No palpable abnormality on vaginal exam Musculoskeletal:     Right lower leg: No edema.     Left lower leg: No edema.  Lymphadenopathy:     Upper Body:     Right upper body: No supraclavicular adenopathy.     Left upper body: No supraclavicular adenopathy.     Lower Body: No right inguinal adenopathy.  Neurological:     Mental Status: She is alert.     Assessment/Plan:   Endometrial cancer (HCC) Locally advanced EC Negative symptom review and exam, normal tumor markers  >repeat CA-125 prior to next visit >return in 6 mos     I personally spent 25 minutes face-to-face and non-face-to-face in the care of this patient, which includes all pre, intra, and post visit time on the date of service.  Lahoma Crocker, MD 02/02/2021, 10:04 AM

## 2021-02-03 ENCOUNTER — Inpatient Hospital Stay: Payer: BC Managed Care – PPO | Attending: Obstetrics & Gynecology | Admitting: Obstetrics & Gynecology

## 2021-02-03 ENCOUNTER — Encounter: Payer: Self-pay | Admitting: Obstetrics & Gynecology

## 2021-02-03 ENCOUNTER — Other Ambulatory Visit: Payer: Self-pay

## 2021-02-03 ENCOUNTER — Other Ambulatory Visit: Payer: BC Managed Care – PPO

## 2021-02-03 VITALS — BP 124/84 | HR 84 | Temp 98.8°F | Resp 16 | Wt 198.8 lb

## 2021-02-03 DIAGNOSIS — I10 Essential (primary) hypertension: Secondary | ICD-10-CM | POA: Diagnosis not present

## 2021-02-03 DIAGNOSIS — C541 Malignant neoplasm of endometrium: Secondary | ICD-10-CM | POA: Insufficient documentation

## 2021-02-03 DIAGNOSIS — Z90710 Acquired absence of both cervix and uterus: Secondary | ICD-10-CM | POA: Insufficient documentation

## 2021-02-03 DIAGNOSIS — K219 Gastro-esophageal reflux disease without esophagitis: Secondary | ICD-10-CM | POA: Insufficient documentation

## 2021-02-03 DIAGNOSIS — Z90722 Acquired absence of ovaries, bilateral: Secondary | ICD-10-CM | POA: Insufficient documentation

## 2021-02-03 DIAGNOSIS — Z79899 Other long term (current) drug therapy: Secondary | ICD-10-CM | POA: Diagnosis not present

## 2021-02-03 DIAGNOSIS — E119 Type 2 diabetes mellitus without complications: Secondary | ICD-10-CM | POA: Insufficient documentation

## 2021-02-03 DIAGNOSIS — Z794 Long term (current) use of insulin: Secondary | ICD-10-CM | POA: Diagnosis not present

## 2021-02-03 NOTE — Patient Instructions (Signed)
Repeat CA 125 prior to next visit in 6 months.

## 2021-02-24 ENCOUNTER — Encounter (INDEPENDENT_AMBULATORY_CARE_PROVIDER_SITE_OTHER): Payer: BC Managed Care – PPO | Admitting: Ophthalmology

## 2021-02-24 ENCOUNTER — Other Ambulatory Visit: Payer: Self-pay

## 2021-02-24 DIAGNOSIS — E113312 Type 2 diabetes mellitus with moderate nonproliferative diabetic retinopathy with macular edema, left eye: Secondary | ICD-10-CM

## 2021-02-24 DIAGNOSIS — I10 Essential (primary) hypertension: Secondary | ICD-10-CM | POA: Diagnosis not present

## 2021-02-24 DIAGNOSIS — H35033 Hypertensive retinopathy, bilateral: Secondary | ICD-10-CM

## 2021-02-24 DIAGNOSIS — E113391 Type 2 diabetes mellitus with moderate nonproliferative diabetic retinopathy without macular edema, right eye: Secondary | ICD-10-CM

## 2021-02-24 DIAGNOSIS — H43813 Vitreous degeneration, bilateral: Secondary | ICD-10-CM

## 2021-03-24 ENCOUNTER — Other Ambulatory Visit: Payer: Self-pay

## 2021-03-24 ENCOUNTER — Encounter (INDEPENDENT_AMBULATORY_CARE_PROVIDER_SITE_OTHER): Payer: BC Managed Care – PPO | Admitting: Ophthalmology

## 2021-03-24 DIAGNOSIS — E113391 Type 2 diabetes mellitus with moderate nonproliferative diabetic retinopathy without macular edema, right eye: Secondary | ICD-10-CM

## 2021-03-24 DIAGNOSIS — H43813 Vitreous degeneration, bilateral: Secondary | ICD-10-CM

## 2021-03-24 DIAGNOSIS — E113312 Type 2 diabetes mellitus with moderate nonproliferative diabetic retinopathy with macular edema, left eye: Secondary | ICD-10-CM

## 2021-03-24 DIAGNOSIS — H35033 Hypertensive retinopathy, bilateral: Secondary | ICD-10-CM

## 2021-03-24 DIAGNOSIS — I10 Essential (primary) hypertension: Secondary | ICD-10-CM

## 2021-04-21 ENCOUNTER — Other Ambulatory Visit: Payer: Self-pay

## 2021-04-21 ENCOUNTER — Encounter (INDEPENDENT_AMBULATORY_CARE_PROVIDER_SITE_OTHER): Payer: BC Managed Care – PPO | Admitting: Ophthalmology

## 2021-04-21 DIAGNOSIS — E113391 Type 2 diabetes mellitus with moderate nonproliferative diabetic retinopathy without macular edema, right eye: Secondary | ICD-10-CM | POA: Diagnosis not present

## 2021-04-21 DIAGNOSIS — H43813 Vitreous degeneration, bilateral: Secondary | ICD-10-CM

## 2021-04-21 DIAGNOSIS — I10 Essential (primary) hypertension: Secondary | ICD-10-CM | POA: Diagnosis not present

## 2021-04-21 DIAGNOSIS — H35033 Hypertensive retinopathy, bilateral: Secondary | ICD-10-CM | POA: Diagnosis not present

## 2021-04-21 DIAGNOSIS — E113312 Type 2 diabetes mellitus with moderate nonproliferative diabetic retinopathy with macular edema, left eye: Secondary | ICD-10-CM | POA: Diagnosis not present

## 2021-05-19 ENCOUNTER — Encounter (INDEPENDENT_AMBULATORY_CARE_PROVIDER_SITE_OTHER): Payer: BC Managed Care – PPO | Admitting: Ophthalmology

## 2021-05-19 ENCOUNTER — Other Ambulatory Visit: Payer: Self-pay

## 2021-05-19 DIAGNOSIS — I10 Essential (primary) hypertension: Secondary | ICD-10-CM | POA: Diagnosis not present

## 2021-05-19 DIAGNOSIS — E113312 Type 2 diabetes mellitus with moderate nonproliferative diabetic retinopathy with macular edema, left eye: Secondary | ICD-10-CM | POA: Diagnosis not present

## 2021-05-19 DIAGNOSIS — E113391 Type 2 diabetes mellitus with moderate nonproliferative diabetic retinopathy without macular edema, right eye: Secondary | ICD-10-CM

## 2021-05-19 DIAGNOSIS — H43813 Vitreous degeneration, bilateral: Secondary | ICD-10-CM

## 2021-05-19 DIAGNOSIS — H35033 Hypertensive retinopathy, bilateral: Secondary | ICD-10-CM | POA: Diagnosis not present

## 2021-06-16 ENCOUNTER — Other Ambulatory Visit: Payer: Self-pay

## 2021-06-16 ENCOUNTER — Encounter (INDEPENDENT_AMBULATORY_CARE_PROVIDER_SITE_OTHER): Payer: BC Managed Care – PPO | Admitting: Ophthalmology

## 2021-06-16 DIAGNOSIS — H35033 Hypertensive retinopathy, bilateral: Secondary | ICD-10-CM | POA: Diagnosis not present

## 2021-06-16 DIAGNOSIS — E113312 Type 2 diabetes mellitus with moderate nonproliferative diabetic retinopathy with macular edema, left eye: Secondary | ICD-10-CM | POA: Diagnosis not present

## 2021-06-16 DIAGNOSIS — I10 Essential (primary) hypertension: Secondary | ICD-10-CM

## 2021-06-16 DIAGNOSIS — H43813 Vitreous degeneration, bilateral: Secondary | ICD-10-CM

## 2021-06-16 DIAGNOSIS — E113291 Type 2 diabetes mellitus with mild nonproliferative diabetic retinopathy without macular edema, right eye: Secondary | ICD-10-CM | POA: Diagnosis not present

## 2021-06-16 DIAGNOSIS — H2513 Age-related nuclear cataract, bilateral: Secondary | ICD-10-CM

## 2021-07-14 ENCOUNTER — Encounter (INDEPENDENT_AMBULATORY_CARE_PROVIDER_SITE_OTHER): Payer: BC Managed Care – PPO | Admitting: Ophthalmology

## 2021-07-14 ENCOUNTER — Other Ambulatory Visit: Payer: Self-pay

## 2021-07-14 DIAGNOSIS — H35033 Hypertensive retinopathy, bilateral: Secondary | ICD-10-CM | POA: Diagnosis not present

## 2021-07-14 DIAGNOSIS — I10 Essential (primary) hypertension: Secondary | ICD-10-CM

## 2021-07-14 DIAGNOSIS — E113291 Type 2 diabetes mellitus with mild nonproliferative diabetic retinopathy without macular edema, right eye: Secondary | ICD-10-CM

## 2021-07-14 DIAGNOSIS — E113312 Type 2 diabetes mellitus with moderate nonproliferative diabetic retinopathy with macular edema, left eye: Secondary | ICD-10-CM

## 2021-07-14 DIAGNOSIS — H43813 Vitreous degeneration, bilateral: Secondary | ICD-10-CM

## 2021-08-10 ENCOUNTER — Other Ambulatory Visit: Payer: Self-pay | Admitting: Gynecologic Oncology

## 2021-08-10 ENCOUNTER — Encounter: Payer: Self-pay | Admitting: Obstetrics & Gynecology

## 2021-08-10 DIAGNOSIS — C541 Malignant neoplasm of endometrium: Secondary | ICD-10-CM

## 2021-08-11 ENCOUNTER — Other Ambulatory Visit: Payer: Self-pay

## 2021-08-11 ENCOUNTER — Ambulatory Visit (HOSPITAL_BASED_OUTPATIENT_CLINIC_OR_DEPARTMENT_OTHER): Payer: BC Managed Care – PPO | Admitting: Obstetrics & Gynecology

## 2021-08-11 ENCOUNTER — Encounter: Payer: Self-pay | Admitting: Obstetrics & Gynecology

## 2021-08-11 ENCOUNTER — Encounter (INDEPENDENT_AMBULATORY_CARE_PROVIDER_SITE_OTHER): Payer: BC Managed Care – PPO | Admitting: Ophthalmology

## 2021-08-11 ENCOUNTER — Inpatient Hospital Stay: Payer: BC Managed Care – PPO | Attending: Obstetrics & Gynecology

## 2021-08-11 VITALS — BP 119/77 | HR 85 | Temp 97.2°F | Resp 16 | Ht 61.0 in | Wt 186.4 lb

## 2021-08-11 DIAGNOSIS — Z8542 Personal history of malignant neoplasm of other parts of uterus: Secondary | ICD-10-CM

## 2021-08-11 DIAGNOSIS — I10 Essential (primary) hypertension: Secondary | ICD-10-CM | POA: Diagnosis not present

## 2021-08-11 DIAGNOSIS — E113391 Type 2 diabetes mellitus with moderate nonproliferative diabetic retinopathy without macular edema, right eye: Secondary | ICD-10-CM

## 2021-08-11 DIAGNOSIS — H43813 Vitreous degeneration, bilateral: Secondary | ICD-10-CM

## 2021-08-11 DIAGNOSIS — C541 Malignant neoplasm of endometrium: Secondary | ICD-10-CM

## 2021-08-11 DIAGNOSIS — E113312 Type 2 diabetes mellitus with moderate nonproliferative diabetic retinopathy with macular edema, left eye: Secondary | ICD-10-CM | POA: Diagnosis not present

## 2021-08-11 DIAGNOSIS — H35033 Hypertensive retinopathy, bilateral: Secondary | ICD-10-CM

## 2021-08-11 NOTE — Progress Notes (Signed)
Follow Up Note: Gyn-Onc  Kathryn Stewart 40 y.o. female  CC: She presents for a f/u visit.  HPI: Oncology History Overview Note  Genetics neg   Endometrial cancer ([place])  04/06/2018 Initial Diagnosis   She presented with severe abdominal pain   04/07/2018 Imaging   She presented to the emergency room after the pain woke her up CT imaging was performed 04/07/2018 [facility].  A 6.5 x 7 x 8.1 complex right adnexal mass was found with solid and fat components consistent with a teratoma.  No lymphadenopathy.  Small to moderate ascites and inflammatory changes were seen in the pelvis follow-up ultrasound was also performed showing concern for right ovarian torsion with a large ovarian mass cystic and solid components 9.7 cm.  A left ovarian cyst seen 3.4 cm.  Endometrial thickening with hypervascularity and endometrial thickness of 24 mm moderate pelvic free fluid.   04/07/2018 Surgery   Given the concern for torsion she was taken urgently to the operating room by Dr. Orson Ape. McLeod 04/07/2018.  At that time operative laparoscopy, torsion of the right adnexa was noted along with thickened abnormal endometrium.  She had right salpingo-oophorectomy through a laparoscopic approach along with endometrial biopsy.     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 Biopsy of left  parametrial tissue and sigmoid peritoneum   Surgeon: Bernita Raisin, MD  Specimens: pelvic washings, parametrial biopsy, sigmoid peritoneum biopsy   Complications: none   Indication for Procedure:  Patient found at OSH to have right ovarian mass that was resected and found on permanent section to be c/w endometrial cancer. Also had endometrial biopsy showing the same.   Operative Findings: Significant adhesive disease almost miliary-like. The sigmoid was adherent to the left cornua. After takedown of the sigmoid in this region it was clear the left cornua was involved in an abnormal process including necrotic tissue clinically concerning for malignancy. In addition the bladder was adherent to this region. Posterior surface of the broad ligament in area c/w the parametria on the left showed likely tumor breakthrough. Images were taken. Biopsy from the tissue extruding from the left side of the uterus in the presumed parametrium was sent for frozen. This returned worrisome for disease, but not definitive. A separate area of the sigmoid was adherent inferior to the posterior surface lesion and the remnant of this adhesion was sent as sigmoid peritoneum. This, on frozen, returned with fibrosis.    04/24/2018 Pathology Results   1. Peritoneum, biopsy - METASTATIC CARCINOMA CONSISTENT WITH PATIENT'S CLINICAL HISTORY OF PRIMARY ENDOMETRIAL CARCINOMA. SEE NOTE 2. Soft tissue, biopsy, parametrial - METASTATIC CARCINOMA CONSISTENT WITH PATIENT'S CLINICAL HISTORY OF PRIMARY ENDOMETRIAL CARCINOMA. SEE NOTE Diagnosis Note 1. and 2. Immunohistochemical stains show that the tumor cells are positive for PAX8 and negative for calretinin, consistent with the above diagnosis   04/25/2018 Cancer Staging   Staging form: Corpus Uteri - Carcinoma and Carcinosarcoma, AJCC 8th Edition - Clinical: Stage IVA (cT4, cN0, cM0) - Signed by Alvy Bimler,  Ni, MD on 04/25/2018   05/02/2018 Tumor Marker   Patient's tumor was  tested for the following markers: CA-125 Results of the tumor marker test revealed 56.7   05/04/2018 - 08/28/2018 Chemotherapy   The patient had carboplatin and Taxol   05/08/2018 Procedure   Placement of a power injectable Port-A-Cath   05/29/2018 Tumor Marker   Patient's tumor was tested for the following markers: CA-125 Results of the tumor marker test revealed 31.6   06/19/2018 Tumor Marker   Patient's tumor was tested for the following markers: CA-125 Results of the tumor marker test revealed 21.1   06/25/2018 Genetic Testing   Patient has genetic testing done for 64 genes See report under Media on 06/25/2018 Pathology molecular report Results revealed patient has the following mutation(s): none   06/28/2018 Imaging   No evidence of malignancy or other acute findings within the abdomen or pelvis.  Moderate hepatic steatosis.   07/10/2018 Tumor Marker   Patient's tumor was tested for the following markers: CA-125 Results of the tumor marker test revealed 12.6   09/20/2018 Pathology Results   1. Soft tissue, biopsy, right broad ligament nodule - BENIGN FIBROUS TISSUE WITH FOREIGN BODY GIANT CELL REACTION. - THERE IS NO EVIDENCE OF MALIGNANCY. 2. Soft tissue, biopsy, uterine serosal nodule - BENIGN FIBROUS TISSUE WITH FOREIGN BODY GIANT CELL REACTION. - THERE IS NO EVIDENCE OF MALIGNANCY. 3. Uterus and cervix, left tube and ovary - ENDOMETRIUM: ENDOMETRIOID ADENOCARCINOMA, FIGO GRADE I/III, SCATTERED MICROSCOPIC FOCI. - ADENOCARCINOMA INVOLVES THE INNER HALF OF THE MYOMETRIUM. - MYOMETRIUM: LEIOMYOMA. - SEROSA: ADHESIONS. - RIGHT FALLOPIAN TUBE: UNREMARKABLE. - LEFT ADNEXA: ENDOMETRIOID ADENOCARCINOMA INVOLVING THE OVARY. - BENIGN FALLOPIAN TUBE. - SEE ONCOLOGY TABLE BELOW. Microscopic Comment 3. UTERUS, CARCINOMA OR CARCINOSARCOMA Procedure: Hysterectomy, bilateral fallopian tube resection, and left ovarian oophorectomy. Histologic type: Endometrioid  adenocarcinoma. Histologic Grade: FIGO Grade I Myometrial invasion: Depth of invasion: 2 mm Myometrial thickness: 15 mm Uterine Serosa Involvement: Not identified Cervical stromal involvement: Not identified Extent of involvement of other organs: Involves the left ovary Lymphovascular invasion: Present Regional Lymph Nodes: None examined MMR testing by IHC will be performed on block 3-N Pathologic Stage Classification (pTNM, AJCC 8th edition): pT3, pNX FIGO Stage: At least FIGO Stage III-A   09/20/2018 Surgery   Procedure(s) Performed:  Robotic-assisted laparoscopic hysterectomy, left salpingoophorectomy Lysis of adhesions ~40 minutes   Surgeon: Bernita Raisin, MD    Specimens: Uterus with attached cervix, left tube/ovary, right IP ligament    Operative Findings: Adhesive disease of colon to left sidewall/left adnexa (~30 min lysis of adhesions). Filmy adhesions of right colon to culdesac. Residual right IP ligament. Small areas of questionable peritoneal disease x 2 removed and sent for pathology. No other gross residual disease. There was adhesive disease between the bladder and the cervix/LUS.   10/31/2018 Tumor Marker   Patient's tumor was tested for the following markers: CA-125 Results of the tumor marker test revealed 5.6   12/17/2018 Imaging   1. No evidence of recurrent or metastatic carcinoma within the abdomen or pelvis. 2. Stable hepatic steatosis.   02/05/2019 Procedure   Successful removal of implanted Port-A-Cath.   11/06/2019 Tumor Marker   Patient's tumor was tested for the following markers: CA-125.  Results of the tumor marker test revealed 4.5.   05/06/2020 Tumor Marker   Patient's tumor was tested for the following markers: CA 125. Results of the tumor marker test revealed 5.0.   01/27/2021 Tumor Marker   Patient's tumor was tested  for the following markers: CA-125: 5.7     Interval History: She denies any vaginal bleeding, abdominal/pelvic pain, cough,  lethargy or abdominal distention.    Review of Systems  Review of Systems  Constitutional:  Negative for malaise/fatigue.  Respiratory:  Negative for cough.   Gastrointestinal:  Negative for abdominal pain.  Genitourinary:        Negative for vaginal bleeding   Current Meds:  Outpatient Encounter Medications as of 08/11/2021  Medication Sig   albuterol (VENTOLIN HFA) 108 (90 Base) MCG/ACT inhaler INHALE 2 PUFFS BY MOUTH EVERY 4 TO 6 HOURS AS NEEDED   atorvastatin (LIPITOR) 20 MG tablet Take 20 mg by mouth daily.   BD PEN NEEDLE NANO U/F 32G X 4 MM MISC USE WITH INSULIN PEN AS DIRECTED   BESIVANCE 0.6 % SUSP Apply to eye. After monthly eye injections.   cetirizine (ZYRTEC) 10 MG tablet Take 10 mg by mouth daily.   escitalopram (LEXAPRO) 20 MG tablet Take 20 mg by mouth daily.   FARXIGA 10 MG TABS tablet Take 10 mg by mouth daily.   fenofibrate (TRICOR) 145 MG tablet Take 145 mg by mouth daily.   fluticasone (FLONASE) 50 MCG/ACT nasal spray Place 1 spray into both nostrils daily as needed for allergies.    gabapentin (NEURONTIN) 100 MG capsule Take by mouth.   GLIMEPIRIDE PO Take 8 mg by mouth in the morning.   Insulin Pen Needle 32G X 4 MM MISC Use with insulin pen as directed   LANTUS SOLOSTAR 100 UNIT/ML Solostar Pen Inject 90 Units into the skin at bedtime.   lisinopril (PRINIVIL,ZESTRIL) 10 MG tablet Take 10 mg by mouth at bedtime.    metFORMIN (GLUCOPHAGE) 1000 MG tablet Take 1,000 mg by mouth 2 (two) times daily with a meal.   pantoprazole (PROTONIX) 40 MG tablet Take 40 mg by mouth daily.   RYBELSUS 3 MG TABS Take 1 tablet by mouth daily.   No facility-administered encounter medications on file as of 08/11/2021.    Allergy:  Allergies  Allergen Reactions   Latex Hives and Rash    Per DR. Gerarda Fraction. Question if this is a latex allergy versus Dermabond    Other Rash    Dermabond - unsure allergy    Social Hx:   Social History   Socioeconomic History   Marital status:  Married    Spouse name: Richard   Number of children: Not on file   Years of education: Not on file   Highest education level: Not on file  Occupational History   Occupation: Freight forwarder  Tobacco Use   Smoking status: Never   Smokeless tobacco: Never  Vaping Use   Vaping Use: Never used  Substance and Sexual Activity   Alcohol use: No   Drug use: No   Sexual activity: Yes  Other Topics Concern   Not on file  Social History Narrative   Not on file   Social Determinants of Health   Financial Resource Strain: Not on file  Food Insecurity: Not on file  Transportation Needs: Not on file  Physical Activity: Not on file  Stress: Not on file  Social Connections: Not on file  Intimate Partner Violence: Not on file    Past Surgical Hx:  Past Surgical History:  Procedure Laterality Date   IR IMAGING GUIDED PORT INSERTION  05/08/2018   IR REMOVAL TUN ACCESS W/ PORT W/O FL MOD SED  02/05/2019   LAPAROSCOPIC SALPINGOOPHERECTOMY Right 2019   torsion  ROBOTIC ASSISTED TOTAL HYSTERECTOMY WITH BILATERAL SALPINGO OOPHERECTOMY N/A 04/24/2018   Procedure: XI ROBOTIC  DIAGNOSTIC LAPAROSCOPY WITH BIOPSY.;- DID NOT HAVE THIS SURGERY!   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   GERD (gastroesophageal reflux disease)    Hypertension    PONV (postoperative nausea and vomiting)     Family Hx:  Family History  Problem Relation Age of Onset   Diabetes Mother    Cervical cancer Mother    Throat cancer Mother 55   Heart attack Father    Diabetes Brother    Ovarian cancer Maternal Aunt 21   Uterine cancer  Maternal Aunt 21   Breast cancer Maternal Aunt 21   Epilepsy Paternal Aunt    Kidney cancer Brother 69   Ovarian cancer Cousin 22    Vitals: BP 119/77 (BP Location: Left Arm, Patient Position: Sitting)   Pulse 85   Temp (!) 97.2 F (36.2 C) (Tympanic)   Resp 16   Ht $R'5\' 1"'kg$  (1.549 m)   Wt 186 lb 6.4 oz (84.6 kg)   LMP 04/07/2018 (Approximate)   SpO2 100%   BMI 35.22 kg/m      Physical Exam: Physical Exam Exam conducted with a chaperone present.  Constitutional:      Appearance: Normal appearance.  Abdominal:     General: There is no distension.     Palpations: Abdomen is soft. There is no mass.     Tenderness: There is no abdominal tenderness.  Genitourinary:    General: Normal vulva.     Exam position: Lithotomy position.     Uterus: Absent.      Adnexa:        Right: No mass or tenderness.         Left: No mass or tenderness.       Comments: Angiokeratomata No palpable abnormality on vaginal exam Musculoskeletal:     Right lower leg: No edema.     Left lower leg: No edema.  Lymphadenopathy:     Upper Body:     Right upper body: No supraclavicular adenopathy.     Left upper body: No supraclavicular adenopathy.     Lower Body: Right inguinal adenopathy present. No left inguinal adenopathy.  Neurological:     Mental Status: She is alert.    Assessment/Plan:  Endometrial cancer (HCC) Locally advanced EC Negative symptom review and exam, normal tumor markers  >review the tumor markers >return in 6 mos     I personally spent 25 minutes face-to-face and non-face-to-face in the care of this patient, which includes all pre, intra, and post visit time on the date of service.  Lahoma Crocker, MD 08/11/2021, 7:11 AM

## 2021-08-11 NOTE — Patient Instructions (Signed)
Return in 6 months

## 2021-08-11 NOTE — Assessment & Plan Note (Signed)
Locally advanced EC Negative symptom review and exam, normal tumor markers  >review the tumor markers >return in 6 mos

## 2021-08-12 ENCOUNTER — Telehealth: Payer: Self-pay

## 2021-08-12 LAB — CA 125: Cancer Antigen (CA) 125: 4 U/mL (ref 0.0–38.1)

## 2021-08-12 NOTE — Telephone Encounter (Signed)
Spoke with Ms. Deacon this morning regarding her CA 125 lab result. Informed pt that lab value is stable and WNL. Pt verbalized understanding.

## 2021-09-08 ENCOUNTER — Encounter (INDEPENDENT_AMBULATORY_CARE_PROVIDER_SITE_OTHER): Payer: BC Managed Care – PPO | Admitting: Ophthalmology

## 2021-09-08 ENCOUNTER — Other Ambulatory Visit: Payer: Self-pay

## 2021-09-08 DIAGNOSIS — E113312 Type 2 diabetes mellitus with moderate nonproliferative diabetic retinopathy with macular edema, left eye: Secondary | ICD-10-CM | POA: Diagnosis not present

## 2021-09-08 DIAGNOSIS — I10 Essential (primary) hypertension: Secondary | ICD-10-CM

## 2021-09-08 DIAGNOSIS — E113391 Type 2 diabetes mellitus with moderate nonproliferative diabetic retinopathy without macular edema, right eye: Secondary | ICD-10-CM | POA: Diagnosis not present

## 2021-09-08 DIAGNOSIS — H35033 Hypertensive retinopathy, bilateral: Secondary | ICD-10-CM | POA: Diagnosis not present

## 2021-09-08 DIAGNOSIS — H43813 Vitreous degeneration, bilateral: Secondary | ICD-10-CM

## 2021-10-06 ENCOUNTER — Encounter (INDEPENDENT_AMBULATORY_CARE_PROVIDER_SITE_OTHER): Payer: BC Managed Care – PPO | Admitting: Ophthalmology

## 2021-10-06 ENCOUNTER — Other Ambulatory Visit: Payer: Self-pay

## 2021-10-06 DIAGNOSIS — E113312 Type 2 diabetes mellitus with moderate nonproliferative diabetic retinopathy with macular edema, left eye: Secondary | ICD-10-CM

## 2021-10-06 DIAGNOSIS — H35033 Hypertensive retinopathy, bilateral: Secondary | ICD-10-CM

## 2021-10-06 DIAGNOSIS — E113391 Type 2 diabetes mellitus with moderate nonproliferative diabetic retinopathy without macular edema, right eye: Secondary | ICD-10-CM | POA: Diagnosis not present

## 2021-10-06 DIAGNOSIS — H43813 Vitreous degeneration, bilateral: Secondary | ICD-10-CM

## 2021-10-06 DIAGNOSIS — I10 Essential (primary) hypertension: Secondary | ICD-10-CM

## 2021-11-03 ENCOUNTER — Encounter (INDEPENDENT_AMBULATORY_CARE_PROVIDER_SITE_OTHER): Payer: BC Managed Care – PPO | Admitting: Ophthalmology

## 2021-11-03 ENCOUNTER — Other Ambulatory Visit: Payer: Self-pay

## 2021-11-03 DIAGNOSIS — E113391 Type 2 diabetes mellitus with moderate nonproliferative diabetic retinopathy without macular edema, right eye: Secondary | ICD-10-CM

## 2021-11-03 DIAGNOSIS — H43813 Vitreous degeneration, bilateral: Secondary | ICD-10-CM | POA: Diagnosis not present

## 2021-11-03 DIAGNOSIS — I10 Essential (primary) hypertension: Secondary | ICD-10-CM

## 2021-11-03 DIAGNOSIS — H2513 Age-related nuclear cataract, bilateral: Secondary | ICD-10-CM

## 2021-11-03 DIAGNOSIS — H35033 Hypertensive retinopathy, bilateral: Secondary | ICD-10-CM

## 2021-11-03 DIAGNOSIS — E113312 Type 2 diabetes mellitus with moderate nonproliferative diabetic retinopathy with macular edema, left eye: Secondary | ICD-10-CM

## 2021-12-01 ENCOUNTER — Other Ambulatory Visit: Payer: Self-pay

## 2021-12-01 ENCOUNTER — Encounter (INDEPENDENT_AMBULATORY_CARE_PROVIDER_SITE_OTHER): Payer: BC Managed Care – PPO | Admitting: Ophthalmology

## 2021-12-01 DIAGNOSIS — I10 Essential (primary) hypertension: Secondary | ICD-10-CM

## 2021-12-01 DIAGNOSIS — E113291 Type 2 diabetes mellitus with mild nonproliferative diabetic retinopathy without macular edema, right eye: Secondary | ICD-10-CM

## 2021-12-01 DIAGNOSIS — E113312 Type 2 diabetes mellitus with moderate nonproliferative diabetic retinopathy with macular edema, left eye: Secondary | ICD-10-CM

## 2021-12-01 DIAGNOSIS — H35033 Hypertensive retinopathy, bilateral: Secondary | ICD-10-CM

## 2021-12-01 DIAGNOSIS — H2513 Age-related nuclear cataract, bilateral: Secondary | ICD-10-CM

## 2021-12-01 DIAGNOSIS — H43813 Vitreous degeneration, bilateral: Secondary | ICD-10-CM

## 2021-12-29 ENCOUNTER — Encounter (INDEPENDENT_AMBULATORY_CARE_PROVIDER_SITE_OTHER): Payer: BC Managed Care – PPO | Admitting: Ophthalmology

## 2021-12-29 DIAGNOSIS — I10 Essential (primary) hypertension: Secondary | ICD-10-CM

## 2021-12-29 DIAGNOSIS — H43813 Vitreous degeneration, bilateral: Secondary | ICD-10-CM

## 2021-12-29 DIAGNOSIS — H2513 Age-related nuclear cataract, bilateral: Secondary | ICD-10-CM

## 2021-12-29 DIAGNOSIS — E113312 Type 2 diabetes mellitus with moderate nonproliferative diabetic retinopathy with macular edema, left eye: Secondary | ICD-10-CM

## 2021-12-29 DIAGNOSIS — H35033 Hypertensive retinopathy, bilateral: Secondary | ICD-10-CM | POA: Diagnosis not present

## 2021-12-29 DIAGNOSIS — E113291 Type 2 diabetes mellitus with mild nonproliferative diabetic retinopathy without macular edema, right eye: Secondary | ICD-10-CM | POA: Diagnosis not present

## 2022-01-26 ENCOUNTER — Encounter (INDEPENDENT_AMBULATORY_CARE_PROVIDER_SITE_OTHER): Payer: BC Managed Care – PPO | Admitting: Ophthalmology

## 2022-01-26 DIAGNOSIS — E113291 Type 2 diabetes mellitus with mild nonproliferative diabetic retinopathy without macular edema, right eye: Secondary | ICD-10-CM | POA: Diagnosis not present

## 2022-01-26 DIAGNOSIS — H35033 Hypertensive retinopathy, bilateral: Secondary | ICD-10-CM

## 2022-01-26 DIAGNOSIS — I10 Essential (primary) hypertension: Secondary | ICD-10-CM | POA: Diagnosis not present

## 2022-01-26 DIAGNOSIS — E113312 Type 2 diabetes mellitus with moderate nonproliferative diabetic retinopathy with macular edema, left eye: Secondary | ICD-10-CM

## 2022-01-26 DIAGNOSIS — H43813 Vitreous degeneration, bilateral: Secondary | ICD-10-CM

## 2022-02-24 ENCOUNTER — Encounter (INDEPENDENT_AMBULATORY_CARE_PROVIDER_SITE_OTHER): Payer: BC Managed Care – PPO | Admitting: Ophthalmology

## 2022-02-24 DIAGNOSIS — H43813 Vitreous degeneration, bilateral: Secondary | ICD-10-CM

## 2022-02-24 DIAGNOSIS — I10 Essential (primary) hypertension: Secondary | ICD-10-CM | POA: Diagnosis not present

## 2022-02-24 DIAGNOSIS — E113291 Type 2 diabetes mellitus with mild nonproliferative diabetic retinopathy without macular edema, right eye: Secondary | ICD-10-CM | POA: Diagnosis not present

## 2022-02-24 DIAGNOSIS — E113312 Type 2 diabetes mellitus with moderate nonproliferative diabetic retinopathy with macular edema, left eye: Secondary | ICD-10-CM

## 2022-02-24 DIAGNOSIS — H35033 Hypertensive retinopathy, bilateral: Secondary | ICD-10-CM | POA: Diagnosis not present

## 2022-03-23 ENCOUNTER — Inpatient Hospital Stay: Payer: BC Managed Care – PPO | Admitting: Obstetrics & Gynecology

## 2022-03-24 ENCOUNTER — Encounter (INDEPENDENT_AMBULATORY_CARE_PROVIDER_SITE_OTHER): Payer: BC Managed Care – PPO | Admitting: Ophthalmology

## 2022-03-24 DIAGNOSIS — I10 Essential (primary) hypertension: Secondary | ICD-10-CM | POA: Diagnosis not present

## 2022-03-24 DIAGNOSIS — E113291 Type 2 diabetes mellitus with mild nonproliferative diabetic retinopathy without macular edema, right eye: Secondary | ICD-10-CM

## 2022-03-24 DIAGNOSIS — H35033 Hypertensive retinopathy, bilateral: Secondary | ICD-10-CM

## 2022-03-24 DIAGNOSIS — E113312 Type 2 diabetes mellitus with moderate nonproliferative diabetic retinopathy with macular edema, left eye: Secondary | ICD-10-CM

## 2022-03-24 DIAGNOSIS — H43813 Vitreous degeneration, bilateral: Secondary | ICD-10-CM

## 2022-03-31 ENCOUNTER — Telehealth: Payer: Self-pay | Admitting: *Deleted

## 2022-03-31 NOTE — Telephone Encounter (Signed)
error 

## 2022-04-12 ENCOUNTER — Encounter: Payer: Self-pay | Admitting: Obstetrics & Gynecology

## 2022-04-13 ENCOUNTER — Other Ambulatory Visit: Payer: Self-pay

## 2022-04-13 ENCOUNTER — Inpatient Hospital Stay: Payer: BC Managed Care – PPO | Attending: Obstetrics & Gynecology | Admitting: Obstetrics & Gynecology

## 2022-04-13 ENCOUNTER — Encounter: Payer: Self-pay | Admitting: Obstetrics & Gynecology

## 2022-04-13 ENCOUNTER — Inpatient Hospital Stay: Payer: BC Managed Care – PPO

## 2022-04-13 VITALS — BP 117/77 | HR 84 | Temp 98.7°F | Resp 16 | Wt 180.0 lb

## 2022-04-13 DIAGNOSIS — Z90710 Acquired absence of both cervix and uterus: Secondary | ICD-10-CM | POA: Insufficient documentation

## 2022-04-13 DIAGNOSIS — Z9221 Personal history of antineoplastic chemotherapy: Secondary | ICD-10-CM | POA: Insufficient documentation

## 2022-04-13 DIAGNOSIS — Z8542 Personal history of malignant neoplasm of other parts of uterus: Secondary | ICD-10-CM | POA: Insufficient documentation

## 2022-04-13 DIAGNOSIS — Z90722 Acquired absence of ovaries, bilateral: Secondary | ICD-10-CM | POA: Insufficient documentation

## 2022-04-13 DIAGNOSIS — C541 Malignant neoplasm of endometrium: Secondary | ICD-10-CM

## 2022-04-13 NOTE — Patient Instructions (Addendum)
Return in 6 months.  CA125 lab today. We will notify you of the results

## 2022-04-13 NOTE — Progress Notes (Signed)
Follow Up Note: Gyn-Onc  Kathryn Stewart 41 y.o. female  CC: She presents for a f/u visit.  HPI: Oncology History Overview Note  Genetics neg   Endometrial cancer ([place])  04/06/2018 Initial Diagnosis   She presented with severe abdominal pain   04/07/2018 Imaging   She presented to the emergency room after the pain woke her up CT imaging was performed 04/07/2018 [facility].  A 6.5 x 7 x 8.1 complex right adnexal mass was found with solid and fat components consistent with a teratoma.  No lymphadenopathy.  Small to moderate ascites and inflammatory changes were seen in the pelvis follow-up ultrasound was also performed showing concern for right ovarian torsion with a large ovarian mass cystic and solid components 9.7 cm.  A left ovarian cyst seen 3.4 cm.  Endometrial thickening with hypervascularity and endometrial thickness of 24 mm moderate pelvic free fluid.   04/07/2018 Surgery   Given the concern for torsion she was taken urgently to the operating room by Dr. Orson Ape. McLeod 04/07/2018.  At that time operative laparoscopy, torsion of the right adnexa was noted along with thickened abnormal endometrium.  She had right salpingo-oophorectomy through a laparoscopic approach along with endometrial biopsy.     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 Biopsy of left  parametrial tissue and sigmoid peritoneum   Surgeon: Bernita Raisin, MD  Specimens: pelvic washings, parametrial biopsy, sigmoid peritoneum biopsy   Complications: none   Indication for Procedure:  Patient found at OSH to have right ovarian mass that was resected and found on permanent section to be c/w endometrial cancer. Also had endometrial biopsy showing the same.   Operative Findings: Significant adhesive disease almost miliary-like. The sigmoid was adherent to the left cornua. After takedown of the sigmoid in this region it was clear the left cornua was involved in an abnormal process including necrotic tissue clinically concerning for malignancy. In addition the bladder was adherent to this region. Posterior surface of the broad ligament in area c/w the parametria on the left showed likely tumor breakthrough. Images were taken. Biopsy from the tissue extruding from the left side of the uterus in the presumed parametrium was sent for frozen. This returned worrisome for disease, but not definitive. A separate area of the sigmoid was adherent inferior to the posterior surface lesion and the remnant of this adhesion was sent as sigmoid peritoneum. This, on frozen, returned with fibrosis.    04/24/2018 Pathology Results   1. Peritoneum, biopsy - METASTATIC CARCINOMA CONSISTENT WITH PATIENT'S CLINICAL HISTORY OF PRIMARY ENDOMETRIAL CARCINOMA. SEE NOTE 2. Soft tissue, biopsy, parametrial - METASTATIC CARCINOMA CONSISTENT WITH PATIENT'S CLINICAL HISTORY OF PRIMARY ENDOMETRIAL CARCINOMA. SEE NOTE Diagnosis Note 1. and 2. Immunohistochemical stains show that the tumor cells are positive for PAX8 and negative for calretinin, consistent with the above diagnosis   04/25/2018 Cancer Staging   Staging form: Corpus Uteri - Carcinoma and Carcinosarcoma, AJCC 8th Edition - Clinical: Stage IVA (cT4, cN0, cM0) - Signed by Alvy Bimler,  Ni, MD on 04/25/2018   05/02/2018 Tumor Marker   Patient's tumor was  tested for the following markers: CA-125 Results of the tumor marker test revealed 56.7   05/04/2018 - 08/28/2018 Chemotherapy   The patient had carboplatin and Taxol   05/08/2018 Procedure   Placement of a power injectable Port-A-Cath   05/29/2018 Tumor Marker   Patient's tumor was tested for the following markers: CA-125 Results of the tumor marker test revealed 31.6   06/19/2018 Tumor Marker   Patient's tumor was tested for the following markers: CA-125 Results of the tumor marker test revealed 21.1   06/25/2018 Genetic Testing   Patient has genetic testing done for 64 genes See report under Media on 06/25/2018 Pathology molecular report Results revealed patient has the following mutation(s): none   06/28/2018 Imaging   No evidence of malignancy or other acute findings within the abdomen or pelvis.  Moderate hepatic steatosis.   07/10/2018 Tumor Marker   Patient's tumor was tested for the following markers: CA-125 Results of the tumor marker test revealed 12.6   09/20/2018 Pathology Results   1. Soft tissue, biopsy, right broad ligament nodule - BENIGN FIBROUS TISSUE WITH FOREIGN BODY GIANT CELL REACTION. - THERE IS NO EVIDENCE OF MALIGNANCY. 2. Soft tissue, biopsy, uterine serosal nodule - BENIGN FIBROUS TISSUE WITH FOREIGN BODY GIANT CELL REACTION. - THERE IS NO EVIDENCE OF MALIGNANCY. 3. Uterus and cervix, left tube and ovary - ENDOMETRIUM: ENDOMETRIOID ADENOCARCINOMA, FIGO GRADE I/III, SCATTERED MICROSCOPIC FOCI. - ADENOCARCINOMA INVOLVES THE INNER HALF OF THE MYOMETRIUM. - MYOMETRIUM: LEIOMYOMA. - SEROSA: ADHESIONS. - RIGHT FALLOPIAN TUBE: UNREMARKABLE. - LEFT ADNEXA: ENDOMETRIOID ADENOCARCINOMA INVOLVING THE OVARY. - BENIGN FALLOPIAN TUBE. - SEE ONCOLOGY TABLE BELOW. Microscopic Comment 3. UTERUS, CARCINOMA OR CARCINOSARCOMA Procedure: Hysterectomy, bilateral fallopian tube resection, and left ovarian oophorectomy. Histologic type: Endometrioid  adenocarcinoma. Histologic Grade: FIGO Grade I Myometrial invasion: Depth of invasion: 2 mm Myometrial thickness: 15 mm Uterine Serosa Involvement: Not identified Cervical stromal involvement: Not identified Extent of involvement of other organs: Involves the left ovary Lymphovascular invasion: Present Regional Lymph Nodes: None examined MMR testing by IHC will be performed on block 3-N Pathologic Stage Classification (pTNM, AJCC 8th edition): pT3, pNX FIGO Stage: At least FIGO Stage III-A   09/20/2018 Surgery   Procedure(s) Performed:  Robotic-assisted laparoscopic hysterectomy, left salpingoophorectomy Lysis of adhesions ~40 minutes   Surgeon: Bernita Raisin, MD    Specimens: Uterus with attached cervix, left tube/ovary, right IP ligament    Operative Findings: Adhesive disease of colon to left sidewall/left adnexa (~30 min lysis of adhesions). Filmy adhesions of right colon to culdesac. Residual right IP ligament. Small areas of questionable peritoneal disease x 2 removed and sent for pathology. No other gross residual disease. There was adhesive disease between the bladder and the cervix/LUS.   10/31/2018 Tumor Marker   Patient's tumor was tested for the following markers: CA-125 Results of the tumor marker test revealed 5.6   12/17/2018 Imaging   1. No evidence of recurrent or metastatic carcinoma within the abdomen or pelvis. 2. Stable hepatic steatosis.   02/05/2019 Procedure   Successful removal of implanted Port-A-Cath.   11/06/2019 Tumor Marker   Patient's tumor was tested for the following markers: CA-125.  Results of the tumor marker test revealed 4.5.   05/06/2020 Tumor Marker   Patient's tumor was tested for the following markers: CA 125. Results of the tumor marker test revealed 5.0.   01/27/2021 Tumor Marker   Patient's tumor was tested  for the following markers: CA-125: 5.7    08/11/2021 Tumor Marker   Patient's tumor was tested for the following markers: CA  125. Results of the tumor marker test revealed 4.0.    Interval History: She denies any vaginal bleeding, abdominal/pelvic pain, cough, lethargy or abdominal distention.    Review of Systems  Review of Systems  Constitutional:  Negative for malaise/fatigue.  Respiratory:  Negative for cough.   Gastrointestinal:  Negative for abdominal pain.  Genitourinary:        Negative for vaginal bleeding    Current Meds:  Outpatient Encounter Medications as of 04/13/2022  Medication Sig   albuterol (VENTOLIN HFA) 108 (90 Base) MCG/ACT inhaler INHALE 2 PUFFS BY MOUTH EVERY 4 TO 6 HOURS AS NEEDED   atorvastatin (LIPITOR) 20 MG tablet Take 20 mg by mouth daily.   BD PEN NEEDLE NANO U/F 32G X 4 MM MISC USE WITH INSULIN PEN AS DIRECTED   BESIVANCE 0.6 % SUSP Apply to eye. After monthly eye injections.   cetirizine (ZYRTEC) 10 MG tablet Take 10 mg by mouth daily.   escitalopram (LEXAPRO) 20 MG tablet Take 20 mg by mouth daily.   FARXIGA 10 MG TABS tablet Take 10 mg by mouth daily.   fenofibrate (TRICOR) 145 MG tablet Take 145 mg by mouth daily.   fluticasone (FLONASE) 50 MCG/ACT nasal spray Place 1 spray into both nostrils daily as needed for allergies.    GLIMEPIRIDE PO Take 8 mg by mouth in the morning.   Insulin Pen Needle 32G X 4 MM MISC Use with insulin pen as directed   LANTUS SOLOSTAR 100 UNIT/ML Solostar Pen Inject 90 Units into the skin at bedtime.   lisinopril (PRINIVIL,ZESTRIL) 10 MG tablet Take 10 mg by mouth at bedtime.    metFORMIN (GLUCOPHAGE) 1000 MG tablet Take 1,000 mg by mouth 2 (two) times daily with a meal.   pantoprazole (PROTONIX) 40 MG tablet Take 40 mg by mouth daily.   RYBELSUS 3 MG TABS Take 1 tablet by mouth daily.   No facility-administered encounter medications on file as of 04/13/2022.    Allergy:  Allergies  Allergen Reactions   Latex Hives and Rash    Per DR. Gerarda Fraction. Question if this is a latex allergy versus Dermabond    Other Rash    Dermabond - unsure allergy     Social Hx:   Social History   Socioeconomic History   Marital status: Married    Spouse name: Richard   Number of children: Not on file   Years of education: Not on file   Highest education level: Not on file  Occupational History   Occupation: Freight forwarder  Tobacco Use   Smoking status: Never   Smokeless tobacco: Never  Vaping Use   Vaping Use: Never used  Substance and Sexual Activity   Alcohol use: No   Drug use: No   Sexual activity: Not Currently  Other Topics Concern   Not on file  Social History Narrative   Not on file   Social Determinants of Health   Financial Resource Strain: Not on file  Food Insecurity: Not on file  Transportation Needs: Not on file  Physical Activity: Not on file  Stress: Not on file  Social Connections: Not on file  Intimate Partner Violence: Not on file    Past Surgical Hx:  Past Surgical History:  Procedure Laterality Date   IR IMAGING GUIDED PORT INSERTION  05/08/2018   IR REMOVAL TUN ACCESS W/ PORT  W/O FL MOD SED  02/05/2019   LAPAROSCOPIC SALPINGOOPHERECTOMY Right 2019   torsion   ROBOTIC ASSISTED TOTAL HYSTERECTOMY WITH BILATERAL SALPINGO OOPHERECTOMY N/A 04/24/2018   Procedure: XI ROBOTIC  DIAGNOSTIC LAPAROSCOPY WITH BIOPSY.;- DID NOT HAVE THIS SURGERY!   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   GERD (gastroesophageal reflux disease)    Hypertension    PONV (postoperative nausea and vomiting)     Family Hx:  Family History  Problem Relation Age of Onset   Diabetes Mother    Cervical cancer Mother    Throat cancer Mother 75   Heart  attack Father    Diabetes Brother    Ovarian cancer Maternal Aunt 21   Uterine cancer Maternal Aunt 21   Breast cancer Maternal Aunt 21   Epilepsy Paternal Aunt    Kidney cancer Brother 27   Ovarian cancer Cousin 22    Vitals: BP 117/77 (BP Location: Left Arm, Patient Position: Sitting)   Pulse 84   Temp 98.7 F (37.1 C) (Oral)   Resp 16   Wt 180 lb (81.6 kg)   LMP 04/07/2018 (Within Days)   SpO2 100%   BMI 34.01 kg/m       Physical Exam: Physical Exam Exam conducted with a chaperone present.  Constitutional:      Appearance: Normal appearance.  Abdominal:     General: There is no distension.     Palpations: Abdomen is soft. There is no mass.     Tenderness: There is no abdominal tenderness.  Genitourinary:    General: Normal vulva.     Exam position: Lithotomy position.     Uterus: Absent.      Adnexa:        Right: No mass or tenderness.         Left: No mass or tenderness.       Comments: Angiokeratomata No palpable abnormality on vaginal exam Musculoskeletal:     Right lower leg: No edema.     Left lower leg: No edema.  Lymphadenopathy:     Upper Body:     Right upper body: No supraclavicular adenopathy.     Left upper body: No supraclavicular adenopathy.     Lower Body: No right inguinal adenopathy. No left inguinal adenopathy.  Neurological:     Mental Status: She is alert.     Assessment/Plan:  Endometrial cancer ([place]) H/O locally advanced EC Negative symptom review, normal tumor makers, normal exam.    >Review the CA 125 result from today's visit >Return prn or in 6 mos   I personally spent 25 minutes face-to-face and non-face-to-face in the care of this patient, which includes all pre, intra, and post visit time on the date of service.      Lahoma Crocker, MD 04/13/2022, 12:41 PM

## 2022-04-13 NOTE — Assessment & Plan Note (Addendum)
H/O locally advanced EC Negative symptom review, normal tumor makers, normal exam.    >Return prn or in 6 mos

## 2022-04-15 LAB — CA 125: Cancer Antigen (CA) 125: 4.5 U/mL (ref 0.0–38.1)

## 2022-04-18 ENCOUNTER — Telehealth: Payer: Self-pay

## 2022-04-18 NOTE — Telephone Encounter (Signed)
Pt aware of stable and within normal range CA125 result. She was thankful for the call

## 2022-04-21 ENCOUNTER — Encounter (INDEPENDENT_AMBULATORY_CARE_PROVIDER_SITE_OTHER): Payer: BC Managed Care – PPO | Admitting: Ophthalmology

## 2022-04-21 DIAGNOSIS — E113312 Type 2 diabetes mellitus with moderate nonproliferative diabetic retinopathy with macular edema, left eye: Secondary | ICD-10-CM

## 2022-04-21 DIAGNOSIS — I10 Essential (primary) hypertension: Secondary | ICD-10-CM | POA: Diagnosis not present

## 2022-04-21 DIAGNOSIS — H35033 Hypertensive retinopathy, bilateral: Secondary | ICD-10-CM | POA: Diagnosis not present

## 2022-04-21 DIAGNOSIS — E113291 Type 2 diabetes mellitus with mild nonproliferative diabetic retinopathy without macular edema, right eye: Secondary | ICD-10-CM

## 2022-04-21 DIAGNOSIS — H43813 Vitreous degeneration, bilateral: Secondary | ICD-10-CM

## 2022-05-19 ENCOUNTER — Encounter (INDEPENDENT_AMBULATORY_CARE_PROVIDER_SITE_OTHER): Payer: BC Managed Care – PPO | Admitting: Ophthalmology

## 2022-05-19 DIAGNOSIS — I10 Essential (primary) hypertension: Secondary | ICD-10-CM

## 2022-05-19 DIAGNOSIS — E113312 Type 2 diabetes mellitus with moderate nonproliferative diabetic retinopathy with macular edema, left eye: Secondary | ICD-10-CM | POA: Diagnosis not present

## 2022-05-19 DIAGNOSIS — E113391 Type 2 diabetes mellitus with moderate nonproliferative diabetic retinopathy without macular edema, right eye: Secondary | ICD-10-CM

## 2022-05-19 DIAGNOSIS — H43813 Vitreous degeneration, bilateral: Secondary | ICD-10-CM

## 2022-05-19 DIAGNOSIS — H35033 Hypertensive retinopathy, bilateral: Secondary | ICD-10-CM | POA: Diagnosis not present

## 2022-06-16 ENCOUNTER — Encounter (INDEPENDENT_AMBULATORY_CARE_PROVIDER_SITE_OTHER): Payer: BC Managed Care – PPO | Admitting: Ophthalmology

## 2022-06-16 DIAGNOSIS — I10 Essential (primary) hypertension: Secondary | ICD-10-CM | POA: Diagnosis not present

## 2022-06-16 DIAGNOSIS — E113291 Type 2 diabetes mellitus with mild nonproliferative diabetic retinopathy without macular edema, right eye: Secondary | ICD-10-CM

## 2022-06-16 DIAGNOSIS — H35033 Hypertensive retinopathy, bilateral: Secondary | ICD-10-CM | POA: Diagnosis not present

## 2022-06-16 DIAGNOSIS — H43813 Vitreous degeneration, bilateral: Secondary | ICD-10-CM

## 2022-06-16 DIAGNOSIS — E113312 Type 2 diabetes mellitus with moderate nonproliferative diabetic retinopathy with macular edema, left eye: Secondary | ICD-10-CM

## 2022-07-20 IMAGING — CT CT ABD-PELV W/ CM
2 of 5 series · 15 of 46 positions shown, 17 images · IV contrast (iopamidol)
Comparison: 03/14/2019

CLINICAL DATA: Follow-up endometrial carcinoma. Previous surgery
and chemotherapy.

EXAM:
CT CHEST, ABDOMEN, AND PELVIS WITH CONTRAST
TECHNIQUE: Multidetector CT imaging of the chest, abdomen and pelvis was
performed following the standard protocol during bolus
administration of intravenous contrast.
CONTRAST:  100mL [id number] IOPAMIDOL ([id number]) INJECTION 61%

[Series 2: cap with 5.00 br40 s3 axial · axial · 0.95mm/px · z∈[+1183,+1723]mm · 12 of 128 slices shown, 14 images]
[im 10/128  soft-tissue]
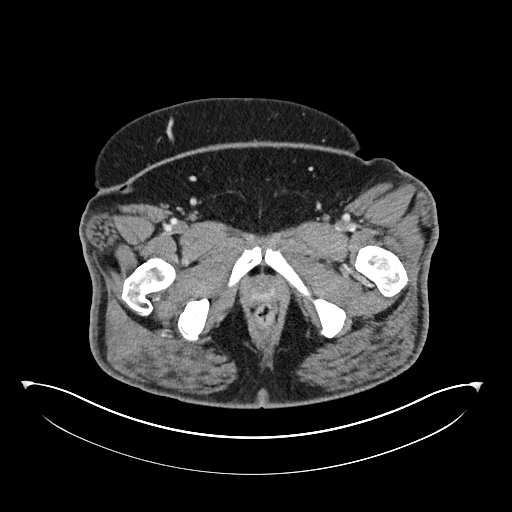
[im 10/128  bone]
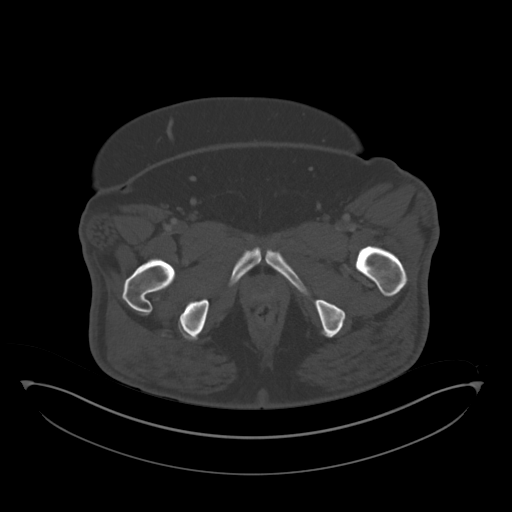
[im 20/128  soft-tissue]
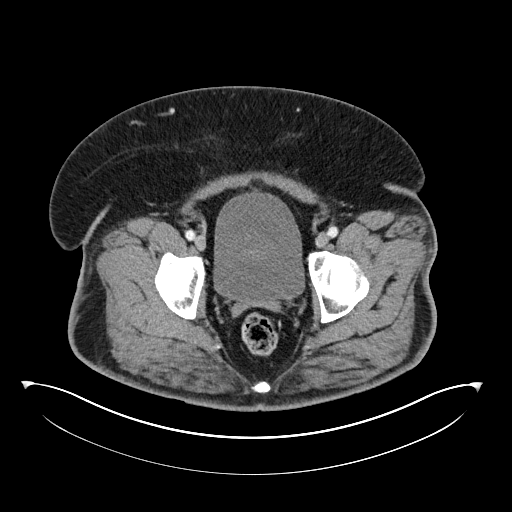
[im 30/128  soft-tissue]
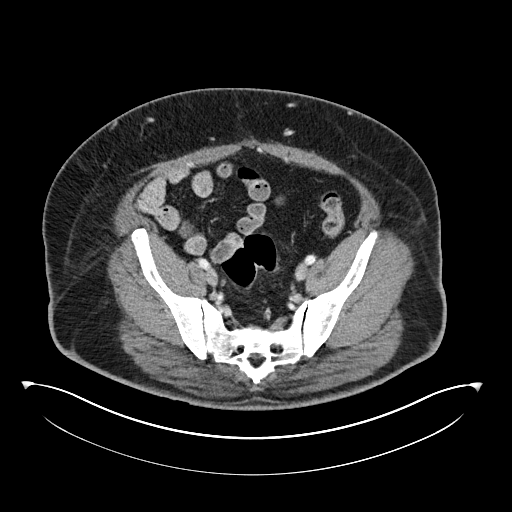
[im 40/128  soft-tissue]
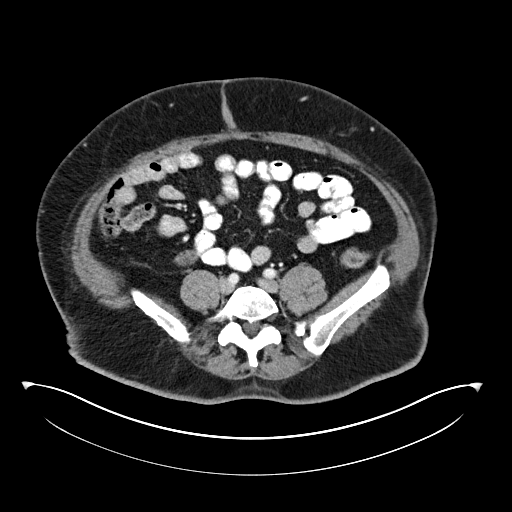
[im 49/128  soft-tissue]
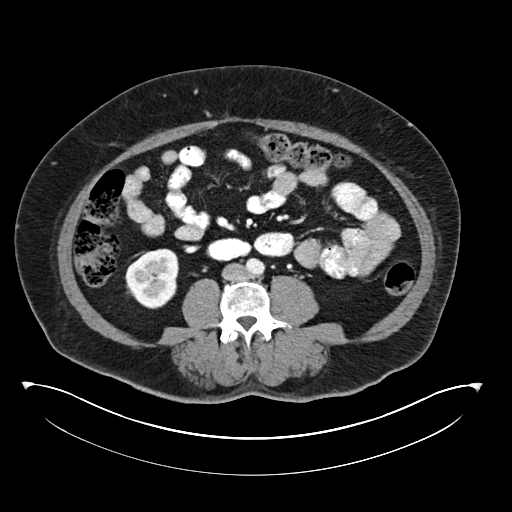
[im 59/128  soft-tissue]
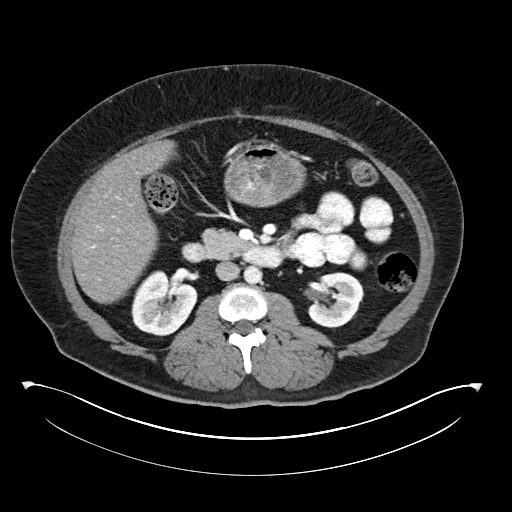
[im 69/128  soft-tissue]
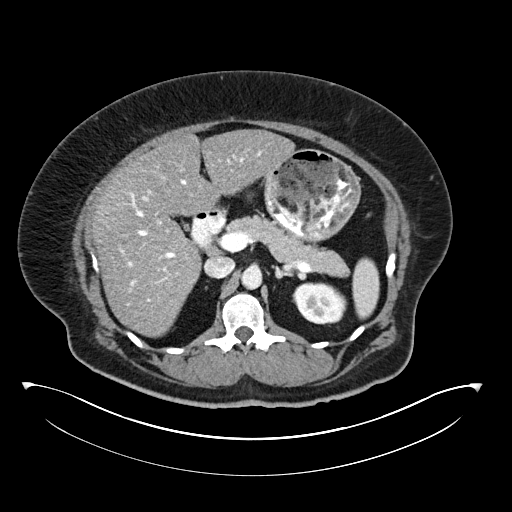
[im 79/128  soft-tissue]
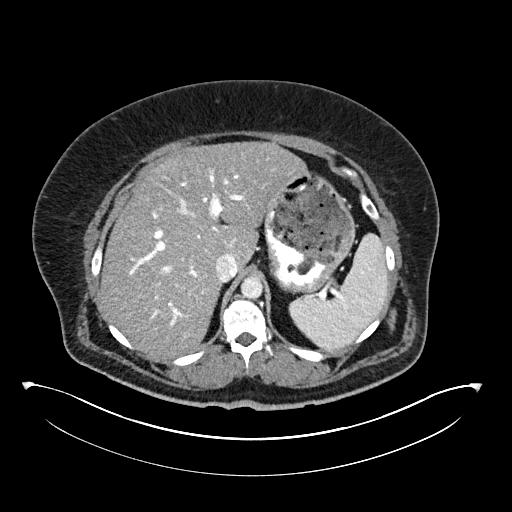
[im 88/128  soft-tissue]
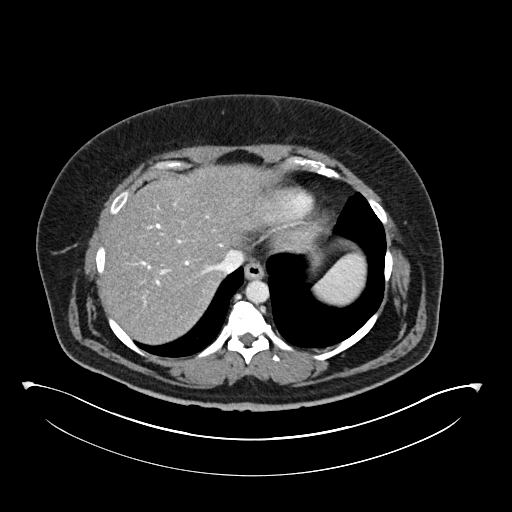
[im 88/128  bone]
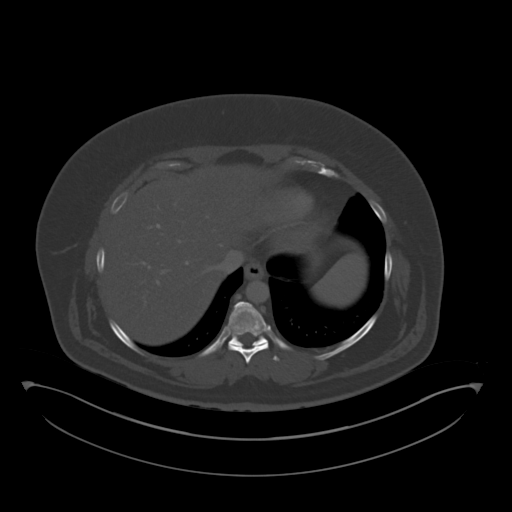
[im 98/128  soft-tissue]
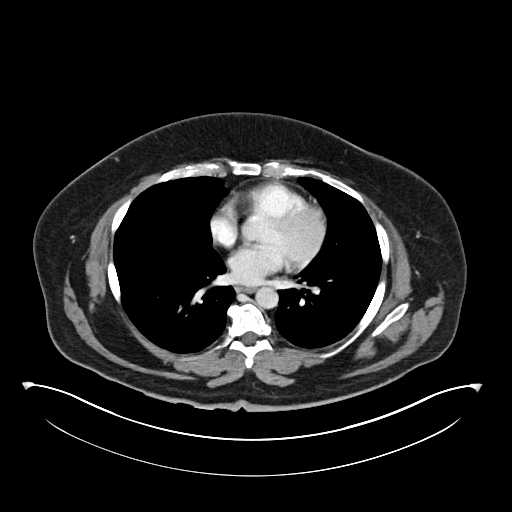
[im 108/128  soft-tissue]
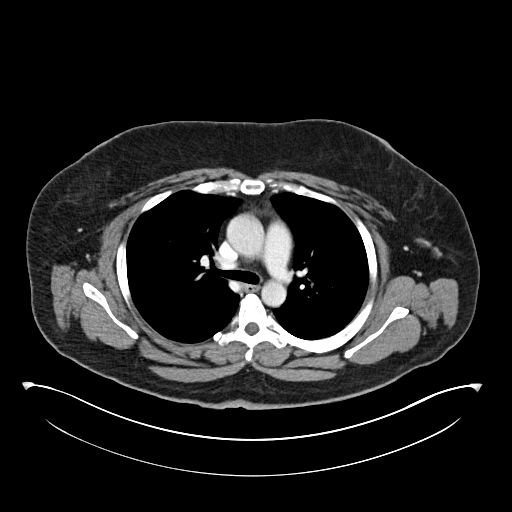
[im 118/128  soft-tissue]
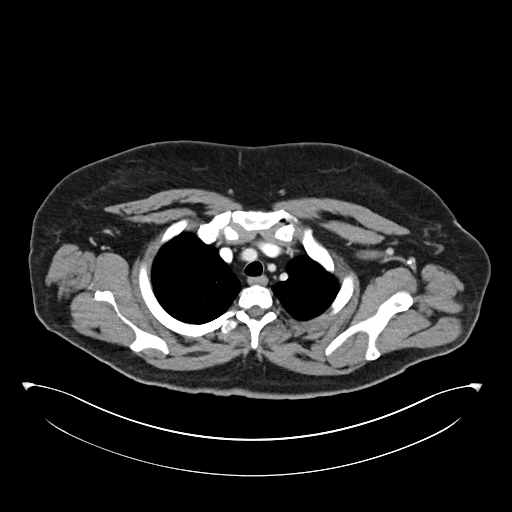

[Series 6: cap with 2.00 br40 s3 cor · coronal · 0.95mm/px · 3 of 242 slices shown]
[im 81/242  soft-tissue]
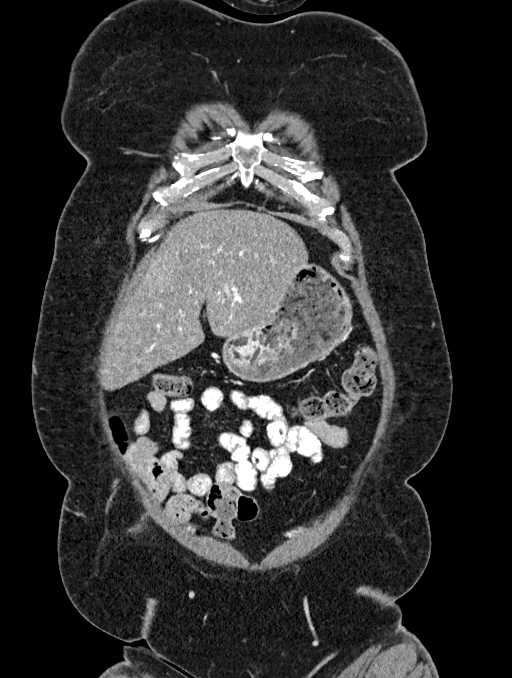
[im 108/242  soft-tissue]
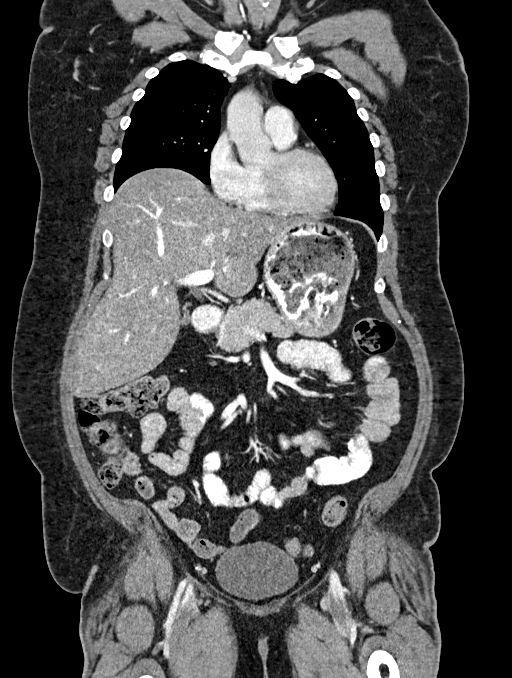
[im 134/242  soft-tissue]
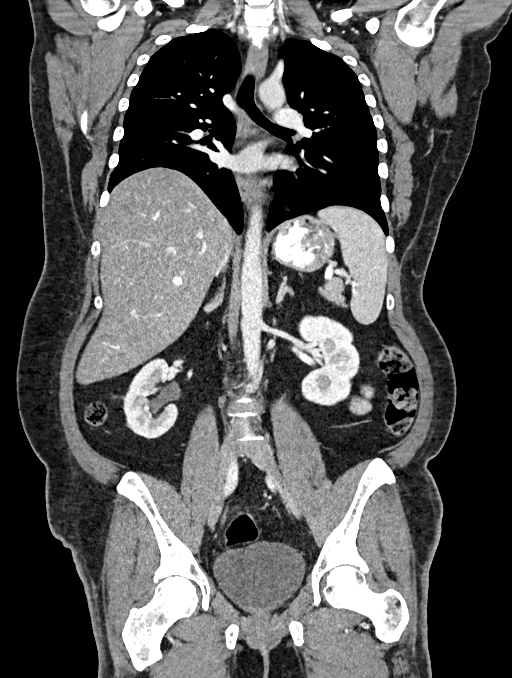

[15 of 46 positions shown; findings below may reference images not displayed]

FINDINGS: CT CHEST FINDINGS

Cardiovascular: No acute findings.

Mediastinum/Lymph Nodes: No masses or pathologically enlarged lymph
nodes identified.

Lungs/Pleura: No pulmonary infiltrate or mass identified. No
effusion present.

Musculoskeletal:  No suspicious bone lesions identified.

CT ABDOMEN AND PELVIS FINDINGS

Hepatobiliary: No masses identified. Moderate diffuse hepatic
steatosis again noted. Gallbladder is unremarkable. No evidence of
biliary ductal dilatation.

Pancreas:  No mass or inflammatory changes.

Spleen:  Within normal limits in size and appearance.

Adrenals/Urinary tract:  No masses or hydronephrosis.

Stomach/Bowel: No evidence of obstruction, inflammatory process, or
abnormal fluid collections.

Vascular/Lymphatic: No pathologically enlarged lymph nodes
identified. No abdominal aortic aneurysm.

Reproductive: Prior hysterectomy noted. Adnexal regions are
unremarkable in appearance. No evidence of peritoneal thickening,
nodularity, or ascites.

Other:  None.

Musculoskeletal:  No suspicious bone lesions identified.
IMPRESSION: Stable exam. No evidence of recurrent or metastatic carcinoma within
the chest, abdomen, or pelvis.

Stable hepatic steatosis.

## 2022-07-21 ENCOUNTER — Encounter (INDEPENDENT_AMBULATORY_CARE_PROVIDER_SITE_OTHER): Payer: BC Managed Care – PPO | Admitting: Ophthalmology

## 2022-07-21 DIAGNOSIS — E113312 Type 2 diabetes mellitus with moderate nonproliferative diabetic retinopathy with macular edema, left eye: Secondary | ICD-10-CM | POA: Diagnosis not present

## 2022-07-21 DIAGNOSIS — H43813 Vitreous degeneration, bilateral: Secondary | ICD-10-CM

## 2022-07-21 DIAGNOSIS — I10 Essential (primary) hypertension: Secondary | ICD-10-CM | POA: Diagnosis not present

## 2022-07-21 DIAGNOSIS — H35033 Hypertensive retinopathy, bilateral: Secondary | ICD-10-CM | POA: Diagnosis not present

## 2022-07-21 DIAGNOSIS — E113291 Type 2 diabetes mellitus with mild nonproliferative diabetic retinopathy without macular edema, right eye: Secondary | ICD-10-CM | POA: Diagnosis not present

## 2022-09-15 ENCOUNTER — Encounter (INDEPENDENT_AMBULATORY_CARE_PROVIDER_SITE_OTHER): Payer: BC Managed Care – PPO | Admitting: Ophthalmology

## 2022-09-15 DIAGNOSIS — I10 Essential (primary) hypertension: Secondary | ICD-10-CM

## 2022-09-15 DIAGNOSIS — E113291 Type 2 diabetes mellitus with mild nonproliferative diabetic retinopathy without macular edema, right eye: Secondary | ICD-10-CM

## 2022-09-15 DIAGNOSIS — H35033 Hypertensive retinopathy, bilateral: Secondary | ICD-10-CM

## 2022-09-15 DIAGNOSIS — E113312 Type 2 diabetes mellitus with moderate nonproliferative diabetic retinopathy with macular edema, left eye: Secondary | ICD-10-CM

## 2022-09-15 DIAGNOSIS — H43813 Vitreous degeneration, bilateral: Secondary | ICD-10-CM

## 2022-11-10 ENCOUNTER — Encounter (INDEPENDENT_AMBULATORY_CARE_PROVIDER_SITE_OTHER): Payer: BC Managed Care – PPO | Admitting: Ophthalmology

## 2022-11-10 DIAGNOSIS — I10 Essential (primary) hypertension: Secondary | ICD-10-CM

## 2022-11-10 DIAGNOSIS — E113212 Type 2 diabetes mellitus with mild nonproliferative diabetic retinopathy with macular edema, left eye: Secondary | ICD-10-CM

## 2022-11-10 DIAGNOSIS — H43813 Vitreous degeneration, bilateral: Secondary | ICD-10-CM

## 2022-11-10 DIAGNOSIS — H35033 Hypertensive retinopathy, bilateral: Secondary | ICD-10-CM

## 2022-11-10 DIAGNOSIS — E113291 Type 2 diabetes mellitus with mild nonproliferative diabetic retinopathy without macular edema, right eye: Secondary | ICD-10-CM

## 2023-01-19 ENCOUNTER — Encounter (INDEPENDENT_AMBULATORY_CARE_PROVIDER_SITE_OTHER): Payer: BC Managed Care – PPO | Admitting: Ophthalmology

## 2023-01-19 DIAGNOSIS — E113212 Type 2 diabetes mellitus with mild nonproliferative diabetic retinopathy with macular edema, left eye: Secondary | ICD-10-CM

## 2023-01-19 DIAGNOSIS — Z7984 Long term (current) use of oral hypoglycemic drugs: Secondary | ICD-10-CM | POA: Diagnosis not present

## 2023-01-19 DIAGNOSIS — Z794 Long term (current) use of insulin: Secondary | ICD-10-CM

## 2023-03-30 ENCOUNTER — Encounter (INDEPENDENT_AMBULATORY_CARE_PROVIDER_SITE_OTHER): Payer: BC Managed Care – PPO | Admitting: Ophthalmology

## 2023-03-30 DIAGNOSIS — H35033 Hypertensive retinopathy, bilateral: Secondary | ICD-10-CM | POA: Diagnosis not present

## 2023-03-30 DIAGNOSIS — E113391 Type 2 diabetes mellitus with moderate nonproliferative diabetic retinopathy without macular edema, right eye: Secondary | ICD-10-CM

## 2023-03-30 DIAGNOSIS — H43813 Vitreous degeneration, bilateral: Secondary | ICD-10-CM

## 2023-03-30 DIAGNOSIS — E113312 Type 2 diabetes mellitus with moderate nonproliferative diabetic retinopathy with macular edema, left eye: Secondary | ICD-10-CM | POA: Diagnosis not present

## 2023-03-30 DIAGNOSIS — I10 Essential (primary) hypertension: Secondary | ICD-10-CM | POA: Diagnosis not present

## 2023-03-30 DIAGNOSIS — Z794 Long term (current) use of insulin: Secondary | ICD-10-CM

## 2023-03-30 DIAGNOSIS — Z7984 Long term (current) use of oral hypoglycemic drugs: Secondary | ICD-10-CM

## 2023-04-18 ENCOUNTER — Encounter: Payer: Self-pay | Admitting: Obstetrics & Gynecology

## 2023-04-18 ENCOUNTER — Inpatient Hospital Stay: Payer: BC Managed Care – PPO | Attending: Obstetrics & Gynecology

## 2023-04-18 ENCOUNTER — Inpatient Hospital Stay (HOSPITAL_BASED_OUTPATIENT_CLINIC_OR_DEPARTMENT_OTHER): Payer: BC Managed Care – PPO | Admitting: Obstetrics & Gynecology

## 2023-04-18 VITALS — BP 133/93 | HR 90 | Temp 98.3°F | Resp 17 | Ht 61.0 in | Wt 183.2 lb

## 2023-04-18 DIAGNOSIS — Z8542 Personal history of malignant neoplasm of other parts of uterus: Secondary | ICD-10-CM | POA: Diagnosis present

## 2023-04-18 DIAGNOSIS — C541 Malignant neoplasm of endometrium: Secondary | ICD-10-CM

## 2023-04-18 NOTE — Progress Notes (Signed)
Follow Up Note: Gyn-Onc  Kathryn Stewart 42 y.o. female  CC: She presents for a f/u visit.  HPI: Oncology History Overview Note  Genetics neg   Endometrial cancer (HCC)  04/06/2018 Initial Diagnosis   She presented with severe abdominal pain   04/07/2018 Imaging   She presented to the emergency room after the pain woke her up CT imaging was performed 04/07/2018 [facility]  A 6.5 x 7 x 8.1 complex right adnexal mass was found with solid and fat components consistent with a teratoma.  No lymphadenopathy.  Small to moderate ascites and inflammatory changes were seen in the pelvis follow-up ultrasound was also performed showing concern for right ovarian torsion with a large ovarian mass cystic and solid components 9.7 cm.  A left ovarian cyst seen 3.4 cm.  Endometrial thickening with hypervascularity and endometrial thickness of 24 mm moderate pelvic free fluid.   04/07/2018 Surgery   Given the concern for torsion she was taken urgently to the operating room by Dr. Anselm Pancoast. McLeod 04/07/2018.  At that time operative laparoscopy, torsion of the right adnexa was noted along with thickened abnormal endometrium.  She had right salpingo-oophorectomy through a laparoscopic approach along with endometrial biopsy.     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 Biopsy of left  parametrial tissue and sigmoid peritoneum   Surgeon: Gwynneth Aliment, MD  Specimens: pelvic washings, parametrial biopsy, sigmoid peritoneum biopsy   Complications: none   Indication for Procedure:  Patient found at OSH to have right ovarian mass that was resected and found on permanent section to be c/w endometrial cancer. Also had endometrial biopsy showing the same.   Operative Findings: Significant adhesive disease almost miliary-like. The sigmoid was adherent to the left cornua. After takedown of the sigmoid in this region it was clear the left cornua was involved in an abnormal process including necrotic tissue clinically concerning for malignancy. In addition the bladder was adherent to this region. Posterior surface of the broad ligament in area c/w the parametria on the left showed likely tumor breakthrough. Images were taken. Biopsy from the tissue extruding from the left side of the uterus in the presumed parametrium was sent for frozen. This returned worrisome for disease, but not definitive. A separate area of the sigmoid was adherent inferior to the posterior surface lesion and the remnant of this adhesion was sent as sigmoid peritoneum. This, on frozen, returned with fibrosis.    04/24/2018 Pathology Results   1. Peritoneum, biopsy - METASTATIC CARCINOMA CONSISTENT WITH PATIENT'S CLINICAL HISTORY OF PRIMARY ENDOMETRIAL CARCINOMA. SEE NOTE 2. Soft tissue, biopsy, parametrial - METASTATIC CARCINOMA CONSISTENT WITH PATIENT'S CLINICAL HISTORY OF PRIMARY ENDOMETRIAL CARCINOMA. SEE NOTE Diagnosis Note 1. and 2. Immunohistochemical stains show that the tumor cells are positive for PAX8 and negative for calretinin, consistent with the above diagnosis   04/25/2018 Cancer Staging   Staging form: Corpus Uteri - Carcinoma and Carcinosarcoma, AJCC 8th Edition - Clinical: Stage IVA (cT4, cN0, cM0) - Signed by Bertis Ruddy,  Ni, MD on 04/25/2018   05/02/2018 Tumor Marker   Patient's tumor was  tested for the following markers: CA-125 Results of the tumor marker test revealed 56.7   05/04/2018 - 08/28/2018 Chemotherapy   The patient had carboplatin and Taxol   05/08/2018 Procedure   Placement of a power injectable Port-A-Cath   05/29/2018 Tumor Marker   Patient's tumor was tested for the following markers: CA-125 Results of the tumor marker test revealed 31.6   06/19/2018 Tumor Marker   Patient's tumor was tested for the following markers: CA-125 Results of the tumor marker test revealed 21.1   06/25/2018 Genetic Testing   Patient has genetic testing done for 64 genes See report under Media on 06/25/2018 Pathology molecular report Results revealed patient has the following mutation(s): none   06/28/2018 Imaging   No evidence of malignancy or other acute findings within the abdomen or pelvis.  Moderate hepatic steatosis.   07/10/2018 Tumor Marker   Patient's tumor was tested for the following markers: CA-125 Results of the tumor marker test revealed 12.6   09/20/2018 Pathology Results   1. Soft tissue, biopsy, right broad ligament nodule - BENIGN FIBROUS TISSUE WITH FOREIGN BODY GIANT CELL REACTION. - THERE IS NO EVIDENCE OF MALIGNANCY. 2. Soft tissue, biopsy, uterine serosal nodule - BENIGN FIBROUS TISSUE WITH FOREIGN BODY GIANT CELL REACTION. - THERE IS NO EVIDENCE OF MALIGNANCY. 3. Uterus and cervix, left tube and ovary - ENDOMETRIUM: ENDOMETRIOID ADENOCARCINOMA, FIGO GRADE I/III, SCATTERED MICROSCOPIC FOCI. - ADENOCARCINOMA INVOLVES THE INNER HALF OF THE MYOMETRIUM. - MYOMETRIUM: LEIOMYOMA. - SEROSA: ADHESIONS. - RIGHT FALLOPIAN TUBE: UNREMARKABLE. - LEFT ADNEXA: ENDOMETRIOID ADENOCARCINOMA INVOLVING THE OVARY. - BENIGN FALLOPIAN TUBE. - SEE ONCOLOGY TABLE BELOW. Microscopic Comment 3. UTERUS, CARCINOMA OR CARCINOSARCOMA Procedure: Hysterectomy, bilateral fallopian tube resection, and left ovarian oophorectomy. Histologic type: Endometrioid  adenocarcinoma. Histologic Grade: FIGO Grade I Myometrial invasion: Depth of invasion: 2 mm Myometrial thickness: 15 mm Uterine Serosa Involvement: Not identified Cervical stromal involvement: Not identified Extent of involvement of other organs: Involves the left ovary Lymphovascular invasion: Present Regional Lymph Nodes: None examined MMR testing by IHC will be performed on block 3-N Pathologic Stage Classification (pTNM, AJCC 8th edition): pT3, pNX FIGO Stage: At least FIGO Stage III-A   09/20/2018 Surgery   Procedure(s) Performed:  Robotic-assisted laparoscopic hysterectomy, left salpingoophorectomy Lysis of adhesions ~40 minutes   Surgeon: Gwynneth Aliment, MD    Specimens: Uterus with attached cervix, left tube/ovary, right IP ligament    Operative Findings: Adhesive disease of colon to left sidewall/left adnexa (~30 min lysis of adhesions). Filmy adhesions of right colon to culdesac. Residual right IP ligament. Small areas of questionable peritoneal disease x 2 removed and sent for pathology. No other gross residual disease. There was adhesive disease between the bladder and the cervix/LUS.   10/31/2018 Tumor Marker   Patient's tumor was tested for the following markers: CA-125 Results of the tumor marker test revealed 5.6   12/17/2018 Imaging   1. No evidence of recurrent or metastatic carcinoma within the abdomen or pelvis. 2. Stable hepatic steatosis.   02/05/2019 Procedure   Successful removal of implanted Port-A-Cath.   11/06/2019 Tumor Marker   Patient's tumor was tested for the following markers: CA-125.  Results of the tumor marker test revealed 4.5.   05/06/2020 Tumor Marker   Patient's tumor was tested for the following markers: CA 125. Results of the tumor marker test revealed 5.0.   01/27/2021 Tumor Marker   Patient's tumor was tested  for the following markers: CA-125: 5.7    08/11/2021 Tumor Marker   Patient's tumor was tested for the following markers: CA  125. Results of the tumor marker test revealed 4.0.   04/13/2022 Tumor Marker   Patient's tumor was tested for the following markers: CA 125. Results of the tumor marker test revealed 4.5.    Interval History: She denies any vaginal bleeding, abdominal/pelvic pain, cough, lethargy or abdominal distention. She presented to the ED several wks ago w/acute RLQ pain ago.  She had associaed nausea/diarrhea. No antecedent trauma or anticoagulant therapy.   Also report an enlarged, left-sided "neck gland" x 5 mos. This has been treated w/abx.  CTAP 8/1: 1. Heterogeneous hypodense region within asymmetric enlarged right  rectus muscle, consistent with rectus sheath hematoma. Follow-up is  recommended to ensure resolution and exclude underlying mass lesion.  2. Hepatic steatosis.   Review of Systems  Review of Systems  Constitutional:  Negative for malaise/fatigue.  Respiratory:  Negative for cough.   Gastrointestinal:  Negative for abdominal pain.  Genitourinary:        Negative for vaginal bleeding    Current Meds:  Outpatient Encounter Medications as of 04/18/2023  Medication Sig   albuterol (VENTOLIN HFA) 108 (90 Base) MCG/ACT inhaler INHALE 2 PUFFS BY MOUTH EVERY 4 TO 6 HOURS AS NEEDED   atorvastatin (LIPITOR) 20 MG tablet Take 20 mg by mouth daily.   BD PEN NEEDLE NANO U/F 32G X 4 MM MISC USE WITH INSULIN PEN AS DIRECTED   BESIVANCE 0.6 % SUSP Apply to eye. After monthly eye injections.   cetirizine (ZYRTEC) 10 MG tablet Take 10 mg by mouth daily.   escitalopram (LEXAPRO) 20 MG tablet Take 20 mg by mouth daily.   FARXIGA 10 MG TABS tablet Take 10 mg by mouth daily.   fenofibrate (TRICOR) 145 MG tablet Take 145 mg by mouth daily.   fluticasone (FLONASE) 50 MCG/ACT nasal spray Place 1 spray into both nostrils daily as needed for allergies.    gabapentin (NEURONTIN) 100 MG capsule Take 2 capsules by mouth at bedtime.   GLIMEPIRIDE PO Take 8 mg by mouth in the morning.   Insulin Pen Needle  32G X 4 MM MISC Use with insulin pen as directed   LANTUS SOLOSTAR 100 UNIT/ML Solostar Pen Inject 90 Units into the skin at bedtime.   lisinopril (PRINIVIL,ZESTRIL) 10 MG tablet Take 10 mg by mouth at bedtime.    metFORMIN (GLUCOPHAGE) 1000 MG tablet Take 1,000 mg by mouth 2 (two) times daily with a meal.   pantoprazole (PROTONIX) 40 MG tablet Take 40 mg by mouth daily.   [DISCONTINUED] RYBELSUS 3 MG TABS Take 1 tablet by mouth daily.   No facility-administered encounter medications on file as of 04/18/2023.    Allergy:  Allergies  Allergen Reactions   Latex Hives and Rash    Per DR. Doroteo Glassman. Question if this is a latex allergy versus Dermabond    Other Rash    Dermabond - unsure allergy    Social Hx:   Social History   Socioeconomic History   Marital status: Married    Spouse name: Richard   Number of children: Not on file   Years of education: Not on file   Highest education level: Not on file  Occupational History   Occupation: Production designer, theatre/television/film  Tobacco Use   Smoking status: Never   Smokeless tobacco: Never  Vaping Use   Vaping status: Never Used  Substance and Sexual Activity  Alcohol use: No   Drug use: No   Sexual activity: Not Currently  Other Topics Concern   Not on file  Social History Narrative   Not on file   Social Determinants of Health   Financial Resource Strain: Low Risk  (12/22/2022)   Received from [facility], [facility]   Overall Financial Resource Strain (CARDIA)    Difficulty of Paying Living Expenses: Not very hard  Food Insecurity: No Food Insecurity (12/22/2022)   Received from [facility], [facility]   Hunger Vital Sign    Worried About Running Out of Food in the Last Year: Never true    Ran Out of Food in the Last Year: Never true  Transportation Needs: No Transportation Needs (12/22/2022)   Received from [facility], [facility]   PRAPARE - Transportation    Lack of Transportation (Medical): No    Lack of Transportation  (Non-Medical): No  Physical Activity: Insufficiently Active (12/22/2022)   Received from [facility], [facility]   Exercise Vital Sign    Days of Exercise per Week: 5 days    Minutes of Exercise per Session: 20 min  Stress: Stress Concern Present (12/22/2022)   Received from [facility], [facility] - Occupational Stress Questionnaire    Feeling of Stress : Very much  Social Connections: Moderately Integrated (12/22/2022)   Received from [facility], [facility]   Social Network    How would you rate your social network (family, work, friends)?: Adequate participation with social networks  Intimate Partner Violence: Not At Risk (12/22/2022)   Received from [facility], [facility]   HITS    Over the last 12 months how often did your partner physically hurt you?: 1    Over the last 12 months how often did your partner insult you or talk down to you?: 1    Over the last 12 months how often did your partner threaten you with physical harm?: 1    Over the last 12 months how often did your partner scream or curse at you?: 2    Past Surgical Hx:  Past Surgical History:  Procedure Laterality Date   IR IMAGING GUIDED PORT INSERTION  05/08/2018   IR REMOVAL TUN ACCESS W/ PORT W/O FL MOD SED  02/05/2019   LAPAROSCOPIC SALPINGOOPHERECTOMY Right 2019   torsion   ROBOTIC ASSISTED TOTAL HYSTERECTOMY WITH BILATERAL SALPINGO OOPHERECTOMY N/A 04/24/2018   Procedure: XI ROBOTIC  DIAGNOSTIC LAPAROSCOPY WITH BIOPSY.;- DID NOT HAVE THIS SURGERY!   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549 m)   Wt 183 lb 4 oz (83.1 kg)   LMP 04/07/2018 (Within Days)   SpO2 98%   BMI 34.62 kg/m       Physical Exam Exam conducted with a chaperone present.  Constitutional:      Appearance: Normal appearance.  Abdominal:     General: There is no distension.     Palpations: Abdomen is soft. There is no mass.     Tenderness: There is no abdominal tenderness.  Genitourinary:    General: Normal vulva.     Exam position: Lithotomy position.     Uterus: Absent.      Adnexa:        Right: No mass or tenderness.         Left: No mass or tenderness.       Comments: Angiokeratomata No palpable abnormality on vaginal exam Musculoskeletal:     Right lower leg: No edema.     Left lower leg: No edema.  Lymphadenopathy:     Upper Body:     Right upper body: No supraclavicular adenopathy.     Left upper body: No supraclavicular adenopathy.     Lower Body: No right inguinal adenopathy. No left inguinal adenopathy.  Neurological:     Mental Status: She is alert.     Assessment/Plan:  Endometrial cancer (HCC) H/O locally advanced EC Recently diagnosed w/a possible rectus muscle hematoma; asymptomatic at present  Orders Placed This Encounter  Procedures   CT ABDOMEN PELVIS W CONTRAST    Standing Status:   Future    Standing Expiration Date:    04/17/2024    Order Specific Question:   If indicated for the ordered procedure, I authorize the administration of contrast media per Radiology protocol    Answer:   Yes    Order Specific Question:   Does the patient have a contrast media/X-ray dye allergy?    Answer:   No    Order Specific Question:   Is patient pregnant?    Answer:   No    Order Specific Question:   Preferred imaging location?    Answer:   [facility]    Order Specific Question:   If indicated for the ordered procedure, I authorize the administration of oral contrast media per Radiology protocol    Answer:   Yes   CA 125    Standing Status:   Future    Number of Occurrences:   1    Standing Expiration Date:   04/16/2024     >Review the CA 125 >Repeat imaging in a few mos >Return prn or in 3 mos   I personally spent 30 minutes face-to-face and non-face-to-face in the care of this patient, which includes all pre, intra, and post visit time on the date of service.    Antionette Char, MD 04/18/2023, 1:49 PM

## 2023-04-18 NOTE — Patient Instructions (Addendum)
Our office is scheduling a CT for November.Per Dr. Tamela Oddi, stop Metformin the day of CT and restart two days after CT scan.  Return in 3 months

## 2023-04-18 NOTE — Assessment & Plan Note (Addendum)
H/O locally advanced EC Recently diagnosed w/a possible rectus muscle hematoma; asymptomatic at present  Orders Placed This Encounter  Procedures   CT ABDOMEN PELVIS W CONTRAST    Standing Status:   Future    Standing Expiration Date:   04/17/2024    Order Specific Question:   If indicated for the ordered procedure, I authorize the administration of contrast media per Radiology protocol    Answer:   Yes    Order Specific Question:   Does the patient have a contrast media/X-ray dye allergy?    Answer:   No    Order Specific Question:   Is patient pregnant?    Answer:   No    Order Specific Question:   Preferred imaging location?    Answer:   [facility]    Order Specific Question:   If indicated for the ordered procedure, I authorize the administration of oral contrast media per Radiology protocol    Answer:   Yes   CA 125    Standing Status:   Future    Number of Occurrences:   1    Standing Expiration Date:   04/16/2024     >Review the CA 125 >Repeat imaging in a few mos >Return prn or in 3 mos

## 2023-04-25 ENCOUNTER — Telehealth: Payer: Self-pay | Admitting: *Deleted

## 2023-04-25 ENCOUNTER — Other Ambulatory Visit: Payer: Self-pay | Admitting: Gynecologic Oncology

## 2023-04-25 DIAGNOSIS — C541 Malignant neoplasm of endometrium: Secondary | ICD-10-CM

## 2023-04-25 NOTE — Telephone Encounter (Signed)
Spoke with Ms. Tackett and relayed message from Warner Mccreedy, NP that Dr. Tamela Oddi has reviewed your recent CA-125 (feels mild increase could be from hematoma) and recommends repeating this when you have her CT scan in November. Pt verbalized understanding and Lab appt. Was given for repeat CA 125 for Tuesday, November 5th at 0900. Pt agreed to date and time and thanked the office for calling.

## 2023-04-25 NOTE — Telephone Encounter (Signed)
-----   Message from Doylene Bode sent at 04/25/2023 12:54 PM EDT ----- Please let the patient know Dr. Tina Griffiths has reviewed her recent CA 125 (feels mild increase could be from hematoma) and recommends repeating this when she has her CT scan in November.  Order is in. She will need a lab appt set up for the CA 125. Thanks

## 2023-06-29 ENCOUNTER — Encounter (INDEPENDENT_AMBULATORY_CARE_PROVIDER_SITE_OTHER): Payer: BC Managed Care – PPO | Admitting: Ophthalmology

## 2023-06-29 DIAGNOSIS — H2513 Age-related nuclear cataract, bilateral: Secondary | ICD-10-CM

## 2023-06-29 DIAGNOSIS — E113212 Type 2 diabetes mellitus with mild nonproliferative diabetic retinopathy with macular edema, left eye: Secondary | ICD-10-CM | POA: Diagnosis not present

## 2023-06-29 DIAGNOSIS — Z794 Long term (current) use of insulin: Secondary | ICD-10-CM

## 2023-06-29 DIAGNOSIS — E113291 Type 2 diabetes mellitus with mild nonproliferative diabetic retinopathy without macular edema, right eye: Secondary | ICD-10-CM | POA: Diagnosis not present

## 2023-06-29 DIAGNOSIS — I10 Essential (primary) hypertension: Secondary | ICD-10-CM

## 2023-06-29 DIAGNOSIS — H35033 Hypertensive retinopathy, bilateral: Secondary | ICD-10-CM

## 2023-06-29 DIAGNOSIS — Z7984 Long term (current) use of oral hypoglycemic drugs: Secondary | ICD-10-CM

## 2023-06-29 DIAGNOSIS — H43813 Vitreous degeneration, bilateral: Secondary | ICD-10-CM

## 2023-07-11 ENCOUNTER — Telehealth: Payer: Self-pay | Admitting: *Deleted

## 2023-07-11 ENCOUNTER — Other Ambulatory Visit: Payer: Self-pay | Admitting: Gynecologic Oncology

## 2023-07-11 ENCOUNTER — Inpatient Hospital Stay: Payer: BC Managed Care – PPO | Attending: Obstetrics & Gynecology

## 2023-07-11 ENCOUNTER — Ambulatory Visit (HOSPITAL_COMMUNITY)
Admission: RE | Admit: 2023-07-11 | Discharge: 2023-07-11 | Disposition: A | Discharge status: Home / Self Care | Payer: BC Managed Care – PPO | Source: Ambulatory Visit | Attending: Obstetrics & Gynecology | Admitting: Obstetrics & Gynecology

## 2023-07-11 DIAGNOSIS — Z8542 Personal history of malignant neoplasm of other parts of uterus: Secondary | ICD-10-CM | POA: Insufficient documentation

## 2023-07-11 DIAGNOSIS — C541 Malignant neoplasm of endometrium: Secondary | ICD-10-CM | POA: Insufficient documentation

## 2023-07-11 DIAGNOSIS — Z9221 Personal history of antineoplastic chemotherapy: Secondary | ICD-10-CM | POA: Diagnosis not present

## 2023-07-11 DIAGNOSIS — Z90722 Acquired absence of ovaries, bilateral: Secondary | ICD-10-CM | POA: Diagnosis not present

## 2023-07-11 DIAGNOSIS — R221 Localized swelling, mass and lump, neck: Secondary | ICD-10-CM | POA: Insufficient documentation

## 2023-07-11 DIAGNOSIS — Z90710 Acquired absence of both cervix and uterus: Secondary | ICD-10-CM | POA: Diagnosis not present

## 2023-07-11 LAB — BASIC METABOLIC PANEL
Anion gap: 6 (ref 5–15)
BUN: 26 mg/dL — ABNORMAL HIGH (ref 6–20)
CO2: 32 mmol/L (ref 22–32)
Calcium: 10.1 mg/dL (ref 8.9–10.3)
Chloride: 104 mmol/L (ref 98–111)
Creatinine, Ser: 1.29 mg/dL — ABNORMAL HIGH (ref 0.44–1.00)
GFR, Estimated: 53 mL/min — ABNORMAL LOW (ref >60)
Glucose, Bld: 307 mg/dL — ABNORMAL HIGH (ref 70–99)
Potassium: 4.9 mmol/L (ref 3.5–5.1)
Sodium: 142 mmol/L (ref 135–145)

## 2023-07-11 MED ORDER — IOHEXOL 300 MG/ML  SOLN
100.0000 mL | Freq: Once | INTRAMUSCULAR | Status: AC | PRN
Start: 2023-07-11 — End: 2023-07-11
  Administered 2023-07-11: 100 mL via INTRAVENOUS

## 2023-07-11 NOTE — Telephone Encounter (Signed)
-----   Message from Doylene Bode sent at 07/11/2023 12:19 PM EST ----- Please let her know her glucose is markedly elevated on the Bmet. Kidney function is elevated around her baseline. Fax results to her PCP. Would see if she checks her sugars at home ----- Message ----- From: Interface, Lab In [place] Sent: 07/11/2023  10:22 AM EST To: Doylene Bode, NP

## 2023-07-11 NOTE — Telephone Encounter (Signed)
Attempted to reach patient to discuss recent lab results. Left voicemail requesting call back.

## 2023-07-11 NOTE — Telephone Encounter (Signed)
Spoke with [name] and relayed message from Warner Mccreedy, NP that glucose is markedly elevated on Basic Metabolic Panel. Kidney function is elevated around her baseline. Pt states she fasted for this blood work and she is working with her PCP on glycemic control and her sugars have been elevated as well as her BUN & Creatine. Pt states, "I drinks 12- 16 oz bottles of water daily not because of thirst, but to help flush out my kidneys".  Advised patient to reach out to her PCP and request referral to endocrinologist. Pt verbalized understanding. Results of BMP faxed to patient's PCP [name], [place] [phone].

## 2023-07-12 LAB — CA 125: Cancer Antigen (CA) 125: 8.2 U/mL (ref 0.0–38.1)

## 2023-07-13 ENCOUNTER — Encounter: Payer: Self-pay | Admitting: Obstetrics & Gynecology

## 2023-07-19 ENCOUNTER — Inpatient Hospital Stay (HOSPITAL_BASED_OUTPATIENT_CLINIC_OR_DEPARTMENT_OTHER): Payer: BC Managed Care – PPO | Admitting: Obstetrics & Gynecology

## 2023-07-19 ENCOUNTER — Encounter: Payer: Self-pay | Admitting: Obstetrics & Gynecology

## 2023-07-19 VITALS — BP 137/88 | HR 83 | Temp 98.7°F | Resp 19 | Wt 185.5 lb

## 2023-07-19 DIAGNOSIS — C541 Malignant neoplasm of endometrium: Secondary | ICD-10-CM

## 2023-07-19 DIAGNOSIS — R221 Localized swelling, mass and lump, neck: Secondary | ICD-10-CM | POA: Diagnosis not present

## 2023-07-19 DIAGNOSIS — Z8542 Personal history of malignant neoplasm of other parts of uterus: Secondary | ICD-10-CM

## 2023-07-19 NOTE — Assessment & Plan Note (Addendum)
H/O locally advanced EC Previously noted rectus muscle hematoma;on CTAP has resolved, normal tumor marke;  asymptomatic at present Persistent left-sided neck mass   >Referral-->ENT >Return prn or in 6 mos

## 2023-07-19 NOTE — Patient Instructions (Signed)
Return in 6 months

## 2023-07-19 NOTE — Progress Notes (Signed)
Follow Up Note: Gyn-Onc  Kathryn Stewart 42 y.o. female  CC: She presents for a f/u visit.  HPI: Oncology History Overview Note  Genetics neg   Endometrial cancer (HCC)  04/06/2018 Initial Diagnosis   She presented with severe abdominal pain   04/07/2018 Imaging   She presented to the emergency room after the pain woke her up CT imaging was performed 04/07/2018 [facility].  A 6.5 x 7 x 8.1 complex right adnexal mass was found with solid and fat components consistent with a teratoma.  No lymphadenopathy.  Small to moderate ascites and inflammatory changes were seen in the pelvis follow-up ultrasound was also performed showing concern for right ovarian torsion with a large ovarian mass cystic and solid components 9.7 cm.  A left ovarian cyst seen 3.4 cm.  Endometrial thickening with hypervascularity and endometrial thickness of 24 mm moderate pelvic free fluid.   04/07/2018 Surgery   Given the concern for torsion she was taken urgently to the operating room by Dr. Anselm Pancoast. McLeod 04/07/2018.  At that time operative laparoscopy, torsion of the right adnexa was noted along with thickened abnormal endometrium.  She had right salpingo-oophorectomy through a laparoscopic approach along with endometrial biopsy.     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 Biopsy of left  parametrial tissue and sigmoid peritoneum   Surgeon: Gwynneth Aliment, MD  Specimens: pelvic washings, parametrial biopsy, sigmoid peritoneum biopsy   Complications: none   Indication for Procedure:  Patient found at OSH to have right ovarian mass that was resected and found on permanent section to be c/w endometrial cancer. Also had endometrial biopsy showing the same.   Operative Findings: Significant adhesive disease almost miliary-like. The sigmoid was adherent to the left cornua. After takedown of the sigmoid in this region it was clear the left cornua was involved in an abnormal process including necrotic tissue clinically concerning for malignancy. In addition the bladder was adherent to this region. Posterior surface of the broad ligament in area c/w the parametria on the left showed likely tumor breakthrough. Images were taken. Biopsy from the tissue extruding from the left side of the uterus in the presumed parametrium was sent for frozen. This returned worrisome for disease, but not definitive. A separate area of the sigmoid was adherent inferior to the posterior surface lesion and the remnant of this adhesion was sent as sigmoid peritoneum. This, on frozen, returned with fibrosis.    04/24/2018 Pathology Results   1. Peritoneum, biopsy - METASTATIC CARCINOMA CONSISTENT WITH PATIENT'S CLINICAL HISTORY OF PRIMARY ENDOMETRIAL CARCINOMA. SEE NOTE 2. Soft tissue, biopsy, parametrial - METASTATIC CARCINOMA CONSISTENT WITH PATIENT'S CLINICAL HISTORY OF PRIMARY ENDOMETRIAL CARCINOMA. SEE NOTE Diagnosis Note 1. and 2. Immunohistochemical stains show that the tumor cells are positive for PAX8 and negative for calretinin, consistent with the above diagnosis   04/25/2018 Cancer Staging   Staging form: Corpus Uteri - Carcinoma and Carcinosarcoma, AJCC 8th Edition - Clinical: Stage IVA (cT4, cN0, cM0) - Signed by Bertis Ruddy,  Ni, MD on 04/25/2018   05/02/2018 Tumor Marker   Patient's tumor was  tested for the following markers: CA-125 Results of the tumor marker test revealed 56.7   05/04/2018 - 08/28/2018 Chemotherapy   The patient had carboplatin and Taxol   05/08/2018 Procedure   Placement of a power injectable Port-A-Cath   05/29/2018 Tumor Marker   Patient's tumor was tested for the following markers: CA-125 Results of the tumor marker test revealed 31.6   06/19/2018 Tumor Marker   Patient's tumor was tested for the following markers: CA-125 Results of the tumor marker test revealed 21.1   06/25/2018 Genetic Testing   Patient has genetic testing done for 64 genes See report under Media on 06/25/2018 Pathology molecular report Results revealed patient has the following mutation(s): none   06/28/2018 Imaging   No evidence of malignancy or other acute findings within the abdomen or pelvis.  Moderate hepatic steatosis.   07/10/2018 Tumor Marker   Patient's tumor was tested for the following markers: CA-125 Results of the tumor marker test revealed 12.6   09/20/2018 Pathology Results   1. Soft tissue, biopsy, right broad ligament nodule - BENIGN FIBROUS TISSUE WITH FOREIGN BODY GIANT CELL REACTION. - THERE IS NO EVIDENCE OF MALIGNANCY. 2. Soft tissue, biopsy, uterine serosal nodule - BENIGN FIBROUS TISSUE WITH FOREIGN BODY GIANT CELL REACTION. - THERE IS NO EVIDENCE OF MALIGNANCY. 3. Uterus and cervix, left tube and ovary - ENDOMETRIUM: ENDOMETRIOID ADENOCARCINOMA, FIGO GRADE I/III, SCATTERED MICROSCOPIC FOCI. - ADENOCARCINOMA INVOLVES THE INNER HALF OF THE MYOMETRIUM. - MYOMETRIUM: LEIOMYOMA. - SEROSA: ADHESIONS. - RIGHT FALLOPIAN TUBE: UNREMARKABLE. - LEFT ADNEXA: ENDOMETRIOID ADENOCARCINOMA INVOLVING THE OVARY. - BENIGN FALLOPIAN TUBE. - SEE ONCOLOGY TABLE BELOW. Microscopic Comment 3. UTERUS, CARCINOMA OR CARCINOSARCOMA Procedure: Hysterectomy, bilateral fallopian tube resection, and left ovarian oophorectomy. Histologic type: Endometrioid  adenocarcinoma. Histologic Grade: FIGO Grade I Myometrial invasion: Depth of invasion: 2 mm Myometrial thickness: 15 mm Uterine Serosa Involvement: Not identified Cervical stromal involvement: Not identified Extent of involvement of other organs: Involves the left ovary Lymphovascular invasion: Present Regional Lymph Nodes: None examined MMR testing by IHC will be performed on block 3-N Pathologic Stage Classification (pTNM, AJCC 8th edition): pT3, pNX FIGO Stage: At least FIGO Stage III-A   09/20/2018 Surgery   Procedure(s) Performed:  Robotic-assisted laparoscopic hysterectomy, left salpingoophorectomy Lysis of adhesions ~40 minutes   Surgeon: Gwynneth Aliment, MD    Specimens: Uterus with attached cervix, left tube/ovary, right IP ligament    Operative Findings: Adhesive disease of colon to left sidewall/left adnexa (~30 min lysis of adhesions). Filmy adhesions of right colon to culdesac. Residual right IP ligament. Small areas of questionable peritoneal disease x 2 removed and sent for pathology. No other gross residual disease. There was adhesive disease between the bladder and the cervix/LUS.   10/31/2018 Tumor Marker   Patient's tumor was tested for the following markers: CA-125 Results of the tumor marker test revealed 5.6   12/17/2018 Imaging   1. No evidence of recurrent or metastatic carcinoma within the abdomen or pelvis. 2. Stable hepatic steatosis.   02/05/2019 Procedure   Successful removal of implanted Port-A-Cath.   11/06/2019 Tumor Marker   Patient's tumor was tested for the following markers: CA-125.  Results of the tumor marker test revealed 4.5.   05/06/2020 Tumor Marker   Patient's tumor was tested for the following markers: CA 125. Results of the tumor marker test revealed 5.0.   01/27/2021 Tumor Marker   Patient's tumor was tested  for the following markers: CA-125: 5.7    08/11/2021 Tumor Marker   Patient's tumor was tested for the following markers: CA  125. Results of the tumor marker test revealed 4.0.   04/13/2022 Tumor Marker   Patient's tumor was tested for the following markers: CA 125. Results of the tumor marker test revealed 4.5.    Interval History: She denies any vaginal bleeding, abdominal/pelvic pain, cough, lethargy or abdominal distention.  She reports feeling "pretty good" with no new or worsening symptoms related to her previous abdominal issues. The patient's primary concern is a neck mass that has been present for approximately five to six months. The mass has reportedly doubled in size, but is not associated with pain or other symptoms. Despite multiple consultations, the mass has not been imaged. The patient denies any other health issues at this time.  Review of prior data: CTAP 11/24 1. Status post hysterectomy without evidence of recurrent or metastatic disease in the abdomen or pelvis.    Review of Systems  Review of Systems  Constitutional:  Negative for malaise/fatigue.  Respiratory:  Negative for cough.   Gastrointestinal:  Negative for abdominal pain.  Genitourinary:        Negative for vaginal bleeding    Current Meds:  Outpatient Encounter Medications as of 07/19/2023  Medication Sig   albuterol (VENTOLIN HFA) 108 (90 Base) MCG/ACT inhaler INHALE 2 PUFFS BY MOUTH EVERY 4 TO 6 HOURS AS NEEDED   atorvastatin (LIPITOR) 20 MG tablet Take 20 mg by mouth daily.   BD PEN NEEDLE NANO U/F 32G X 4 MM MISC USE WITH INSULIN PEN AS DIRECTED   BESIVANCE 0.6 % SUSP Apply to eye. After monthly eye injections.   cetirizine (ZYRTEC) 10 MG tablet Take 10 mg by mouth daily.   escitalopram (LEXAPRO) 20 MG tablet Take 20 mg by mouth daily.   FARXIGA 10 MG TABS tablet Take 10 mg by mouth daily.   fenofibrate (TRICOR) 145 MG tablet Take 145 mg by mouth daily.   fluticasone (FLONASE) 50 MCG/ACT nasal spray Place 1 spray into both nostrils daily as needed for allergies.    gabapentin (NEURONTIN) 100 MG capsule Take 2  capsules by mouth at bedtime.   GLIMEPIRIDE PO Take 8 mg by mouth in the morning.   insulin lispro (HUMALOG) 100 UNIT/ML KwikPen Inject into the skin.   Insulin Pen Needle 32G X 4 MM MISC Use with insulin pen as directed   LANTUS SOLOSTAR 100 UNIT/ML Solostar Pen Inject 90 Units into the skin at bedtime.   lisinopril (PRINIVIL,ZESTRIL) 10 MG tablet Take 10 mg by mouth at bedtime.    metFORMIN (GLUCOPHAGE) 1000 MG tablet Take 1,000 mg by mouth 2 (two) times daily with a meal.   pantoprazole (PROTONIX) 40 MG tablet Take 40 mg by mouth daily.   JARDIANCE 25 MG TABS tablet Take 25 mg by mouth daily.   No facility-administered encounter medications on file as of 07/19/2023.    Allergy:  Allergies  Allergen Reactions   Latex Hives and Rash    Per DR. Doroteo Glassman. Question if this is a latex allergy versus Dermabond    Other Rash    Dermabond - unsure allergy    Social Hx:   Social History   Socioeconomic History   Marital status: Married    Spouse name: Richard   Number of children: Not on file   Years of education: Not on file   Highest education level: Not on file  Occupational History  Occupation: Production designer, theatre/television/film  Tobacco Use   Smoking status: Never   Smokeless tobacco: Never  Vaping Use   Vaping status: Never Used  Substance and Sexual Activity   Alcohol use: No   Drug use: No   Sexual activity: Not Currently  Other Topics Concern   Not on file  Social History Narrative   Not on file   Social Determinants of Health   Financial Resource Strain: Low Risk  (12/22/2022)   Received from [facility], [facility]   Overall Financial Resource Strain (CARDIA)    Difficulty of Paying Living Expenses: Not very hard  Food Insecurity: No Food Insecurity (12/22/2022)   Received from [facility], [facility]   Hunger Vital Sign    Worried About Running Out of Food in the Last Year: Never true    Ran Out of Food in the Last Year: Never true  Transportation Needs: No Transportation  Needs (12/22/2022)   Received from [facility], [facility]   PRAPARE - Transportation    Lack of Transportation (Medical): No    Lack of Transportation (Non-Medical): No  Physical Activity: Insufficiently Active (12/22/2022)   Received from [facility], [facility]   Exercise Vital Sign    Days of Exercise per Week: 5 days    Minutes of Exercise per Session: 20 min  Stress: Stress Concern Present (12/22/2022)   Received from [facility], [facility] - Occupational Stress Questionnaire    Feeling of Stress : Very much  Social Connections: Moderately Integrated (12/22/2022)   Received from [facility], [facility]   Social Network    How would you rate your social network (family, work, friends)?: Adequate participation with social networks  Intimate Partner Violence: Not At Risk (12/22/2022)   Received from [facility], [facility]   HITS    Over the last 12 months how often did your partner physically hurt you?: Never    Over the last 12 months how often did your partner insult you or talk down to you?: Never    Over the last 12 months how often did your partner threaten you with physical harm?: Never    Over the last 12 months how often did your partner scream or curse at you?: Rarely    Past Surgical Hx:  Past Surgical History:  Procedure Laterality Date   IR IMAGING GUIDED PORT INSERTION  05/08/2018   IR REMOVAL TUN ACCESS W/ PORT W/O FL MOD SED  02/05/2019   LAPAROSCOPIC SALPINGOOPHERECTOMY Right 2019   torsion   ROBOTIC ASSISTED TOTAL HYSTERECTOMY WITH BILATERAL SALPINGO OOPHERECTOMY N/A 04/24/2018   Procedure: XI ROBOTIC  DIAGNOSTIC LAPAROSCOPY WITH BIOPSY.;- DID NOT HAVE THIS SURGERY!   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   GERD (gastroesophageal reflux disease)    Hypertension    PONV (postoperative nausea and vomiting)     Family Hx:  Family History  Problem Relation Age of Onset   Diabetes Mother    Cervical cancer Mother    Throat cancer Mother 52   Heart attack Father  Diabetes Brother    Ovarian cancer Maternal Aunt 21   Uterine cancer Maternal Aunt 21   Breast cancer Maternal Aunt 21   Epilepsy Paternal Aunt    Kidney cancer Brother 26   Ovarian cancer Cousin 22    Vitals: BP 137/88 (BP Location: Right Arm, Patient Position: Sitting)   Pulse 83   Temp 98.7 F (37.1 C) (Oral)   Resp 19   Wt 185 lb 8 oz (84.1 kg)   LMP 04/07/2018 (Within Days)   SpO2 97%   BMI 35.05 kg/m      Physical Exam Exam conducted with a chaperone present.  Constitutional:      Appearance: Normal appearance.  Neck:      Comments: Subcentimeter, soft, nontender, mobile mass Abdominal:     General: There is no distension.     Palpations: Abdomen is soft. There is no mass.     Tenderness: There is no abdominal tenderness.  Genitourinary:    General: Normal vulva.     Exam position: Lithotomy position.     Uterus: Absent.      Adnexa:        Right: No mass or tenderness.         Left: No mass or tenderness.       Comments: Angiokeratomata No palpable abnormality on vaginal exam Musculoskeletal:     Right lower leg: No edema.     Left lower leg: No edema.  Lymphadenopathy:     Upper Body:     Right upper body: No supraclavicular adenopathy.     Left upper body: No supraclavicular adenopathy.     Lower Body: No right inguinal adenopathy. No left inguinal adenopathy.  Neurological:     Mental Status: She is alert.    Assessment/Plan:  No problem-specific Assessment & Plan notes found for  this encounter.  Endometrial cancer (HCC) H/O locally advanced EC Previously noted rectus muscle hematoma;on CTAP has resolved, normal tumor marke;  asymptomatic at present Persistent left-sided neck mass   >Referral-->ENT >Return prn or in 6 mos   I personally spent 25 minutes face-to-face and non-face-to-face in the care of this patient, which includes all pre, intra, and post visit time on the date of service.    Antionette Char, MD 07/19/2023, 8:23 AM

## 2023-07-27 ENCOUNTER — Encounter (INDEPENDENT_AMBULATORY_CARE_PROVIDER_SITE_OTHER): Payer: Self-pay | Admitting: Otolaryngology

## 2023-08-08 ENCOUNTER — Encounter (INDEPENDENT_AMBULATORY_CARE_PROVIDER_SITE_OTHER): Payer: Self-pay | Admitting: Otolaryngology

## 2023-09-14 ENCOUNTER — Ambulatory Visit (INDEPENDENT_AMBULATORY_CARE_PROVIDER_SITE_OTHER): Payer: BC Managed Care – PPO | Admitting: Otolaryngology

## 2023-09-14 ENCOUNTER — Encounter (INDEPENDENT_AMBULATORY_CARE_PROVIDER_SITE_OTHER): Payer: Self-pay | Admitting: Otolaryngology

## 2023-09-14 VITALS — BP 168/93 | HR 94 | Ht 61.0 in | Wt 182.0 lb

## 2023-09-14 DIAGNOSIS — R221 Localized swelling, mass and lump, neck: Secondary | ICD-10-CM | POA: Diagnosis not present

## 2023-09-14 DIAGNOSIS — C541 Malignant neoplasm of endometrium: Secondary | ICD-10-CM

## 2023-09-14 NOTE — Progress Notes (Signed)
 ENT CONSULT:  Reason for Consult: left neck mass   HPI: Discussed the use of AI scribe software for clinical note transcription with the patient, who gave verbal consent to proceed.  History of Present Illness   The patient is a 69 yoF hx of uterine cancer, presents with a six-month history of a palpable lump on the left side of their neck. The lump has slightly increased in size over this period. The patient denies any associated symptoms such as pain, numbness, weight loss, fevers, chills, rashes, difficulty swallowing, or hemoptysis. 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 They are currently considered to be in remission. The patient also has a history of asthma, which they manage with a nebulizer and an emergency albuterol inhaler as needed. They deny any history of smoking, heavy alcohol use, or recent infections. No imaging of the neck has been performed to date.     Records Reviewed:  Oncology office visit 07/19/23 Endometrial cancer (HCC)  04/06/2018 Initial Diagnosis    She presented with severe abdominal pain    04/07/2018 Imaging    She presented to the emergency room after the pain woke her up CT imaging was performed 04/07/2018 [facility].  A 6.5 x 7 x 8.1 complex right adnexal mass was found with solid and fat components consistent with a teratoma.  No lymphadenopathy.  Small to moderate ascites and inflammatory changes were seen in the pelvis follow-up ultrasound was also performed showing concern for right ovarian torsion with a large ovarian mass cystic and solid components 9.7 cm.  A left ovarian cyst seen 3.4 cm.  Endometrial thickening with hypervascularity and endometrial thickness of 24 mm moderate pelvic free fluid.    04/07/2018 Surgery    Given the concern for torsion she was taken urgently to the operating room by Dr. Elsie PARAS. McLeod 04/07/2018.   At that time operative laparoscopy, torsion of the right adnexa was noted along with thickened abnormal endometrium.  She had right salpingo-oophorectomy through a laparoscopic approach along with endometrial biopsy.      04/07/2018 Pathology Results    Right ovary showed adenocarcinoma, endometrioid type Endometrial biopsy showed adenocarcinoma   04/25/2018 Cancer Staging    Staging form: Corpus Uteri - Carcinoma and Carcinosarcoma, AJCC 8th Edition - Clinical: Stage IVA (cT4, cN0, cM0) - Signed by Lonn Hicks, MD on 04/25/2018  Endometrial cancer [facility]) H/O locally advanced EC Previously noted rectus muscle hematoma;on CTAP has resolved, normal tumor marke;  asymptomatic at present Persistent left-sided neck mass   >Referral-->ENT >Return prn or in 6 mos     Past Medical History:  Diagnosis Date   #[id number] 04/2018   Arthritis    Asthma    Diabetes mellitus without complication (HCC)    Insulin  dependent   GERD (gastroesophageal reflux disease)    Hypertension    PONV (postoperative nausea and vomiting)     Past Surgical History:  Procedure Laterality Date   IR IMAGING GUIDED PORT INSERTION  05/08/2018   IR REMOVAL TUN ACCESS W/ PORT W/O FL MOD SED  02/05/2019   LAPAROSCOPIC SALPINGOOPHERECTOMY Right 2019   torsion   ROBOTIC ASSISTED TOTAL HYSTERECTOMY WITH BILATERAL SALPINGO OOPHERECTOMY N/A 04/24/2018   Procedure: XI ROBOTIC  DIAGNOSTIC LAPAROSCOPY WITH BIOPSY.;- DID NOT HAVE THIS SURGERY!   ROBOTIC ASSISTED TOTAL HYSTERECTOMY WITH BILATERAL SALPINGO OOPHERECTOMY N/A 09/20/2018   Procedure: XI ROBOTIC ASSISTED TOTAL HYSTERECTOMY  WITH LEFT SALPINGO OOPHORECTOMY;  Surgeon: Anitra Freddy NOVAK, MD;  Location: [facility];  Service: Gynecology;  Laterality: N/A;    Family History  Problem Relation Age of Onset   Diabetes Mother    Cervical cancer Mother    Throat cancer Mother 33   Heart attack Father    Diabetes Brother    Ovarian cancer Maternal Aunt 21   Uterine cancer Maternal Aunt  21   Breast cancer Maternal Aunt 21   Epilepsy Paternal Aunt    Kidney cancer Brother 35   Ovarian cancer Cousin 70    Social History:  reports that she has never smoked. She has never used smokeless tobacco. She reports that she does not drink alcohol and does not use drugs.  Allergies:  Allergies  Allergen Reactions   Latex Hives and Rash    Per DR. Anitra. Question if this is a latex allergy versus Dermabond    Other Rash    Dermabond - unsure allergy    Medications: I have reviewed the patient's current medications.  The PMH, PSH, Medications, Allergies, and SH were reviewed and updated.  ROS: Constitutional: Negative for fever, weight loss and weight gain. Cardiovascular: Negative for chest pain and dyspnea on exertion. Respiratory: Is not experiencing shortness of breath at rest. Gastrointestinal: Negative for nausea and vomiting. Neurological: Negative for headaches. Psychiatric: The patient is not nervous/anxious  Blood pressure (!) 168/93, pulse 94, height 5' 1 (1.549 m), weight 182 lb (82.6 kg), last menstrual period 04/07/2018, SpO2 93%.  PHYSICAL EXAM:  Exam: General: Well-developed, well-nourished Respiratory Respiratory effort: Equal inspiration and expiration without stridor Cardiovascular Peripheral Vascular: Warm extremities with equal color/perfusion Eyes: No nystagmus with equal extraocular motion bilaterally Neuro/Psych/Balance: Patient oriented to person, place, and time; Appropriate mood and affect; Gait is intact with no imbalance; Cranial nerves I-XII are intact Head and Face Inspection: Normocephalic and atraumatic without mass or lesion Palpation: Facial skeleton intact without bony stepoffs Salivary Glands: No mass or tenderness Facial Strength: Facial motility symmetric and full bilaterally ENT Pinna: External ear intact and fully developed External canal: Canal is patent with intact skin Tympanic Membrane: Clear and mobile External  Nose: No scar or anatomic deformity Internal Nose: Septum is deviated to the left. No polyp, or purulence. Mucosal edema and erythema present.  Bilateral inferior turbinate hypertrophy.  Lips, Teeth, and gums: Mucosa and teeth intact and viable TMJ: No pain to palpation with full mobility Oral cavity/oropharynx: No erythema or exudate, no lesions present Nasopharynx: No mass or lesion with intact mucosa Hypopharynx: Intact mucosa without pooling of secretions Larynx Glottic: Full true vocal cord mobility without lesion or mass Supraglottic: Normal appearing epiglottis and AE folds Interarytenoid Space: Moderate pachydermia&edema Subglottic Space: Patent without lesion or edema Neck Neck and Trachea: Midline trachea without mass or lesion Thyroid: No mass or nodularity Lymphatics: palpable 2 cm node vs mass Level 2-3 no overlying skin changes   Procedure: Preoperative diagnosis: left neck mass hx of endometrial cancer  Postoperative diagnosis:   Same  Procedure: Flexible fiberoptic laryngoscopy  Surgeon: Elena Larry, MD  Anesthesia: Topical lidocaine  and Afrin Complications: None Condition is stable throughout exam  Indications and consent:  The patient presents to the clinic with Indirect laryngoscopy view was incomplete. Thus it was recommended that they undergo a flexible fiberoptic laryngoscopy. All of the risks, benefits, and potential complications were reviewed with the patient preoperatively and verbal informed consent was obtained.  Procedure: The patient was seated upright in the clinic. Topical  lidocaine  and Afrin were applied to the nasal cavity. After adequate anesthesia had occurred, I then proceeded to pass the flexible telescope into the nasal cavity. The nasal cavity was patent without rhinorrhea or polyp. The nasopharynx was also patent without mass or lesion. The base of tongue was visualized and was normal. There were no signs of pooling of secretions in the  piriform sinuses. The true vocal folds were mobile bilaterally. There were no signs of glottic or supraglottic mucosal lesion or mass. There was moderate interarytenoid pachydermia and post cricoid edema. The telescope was then slowly withdrawn and the patient tolerated the procedure throughout.  Studies Reviewed: 07/06/23 CBC w/diff normal   Assessment/Plan: Encounter Diagnoses  Name Primary?   Mass of left side of neck Yes   Endometrial cancer (HCC)     Assessment and Plan    Neck Mass left side  Six-month history of a left-sided neck lump without associated symptoms (pain, numbness, weight loss, fevers, chills, rashes, dysphagia, hemoptysis, dyspnea, voice changes). No smoking or heavy alcohol use hx. Hx of Stage IV endometrial cancer treated in 2019 now considered in remission. Physical exam reveals a palpable mass left level 2-3 ~ 2 cm in size. Differential includes reactive versus pathologic lymphadenopathy. Flexible scope exam overall unremarkable without masses or lesions. Unlikely related to previous endometrial cancer. Explained need for CT scan to evaluate mass characteristics and potential biopsy. Reassured her that she had normal physical exam and recent labs showed normal CBC w/diff (Recent CBC (07/06/2023) normal (WBC, Hgb, Hct, platelets). - Order CT scan of the neck with contrast - Evaluate CT scan results to determine if she needs biopsy - Schedule follow-up appointment post-CT results  Asthma Asthma with occasional exacerbations requiring nebulizer treatment (~3 times/month). Uses emergency albuterol inhaler PRN. 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  - Continue regular oncology follow-up for cancer surveillance  Follow-up - Schedule follow-up appointment post-CT results - Contact office if no  communication by end of next week.     Thank you for allowing me to participate in the care of this patient. Please do not hesitate to contact me with any questions or concerns.   Elena Larry, MD Otolaryngology [facility] Phone: [phone] Fax: [phone]    09/14/2023, 5:16 PM

## 2023-09-21 ENCOUNTER — Ambulatory Visit
Admission: RE | Admit: 2023-09-21 | Discharge: 2023-09-21 | Disposition: A | Discharge status: Home / Self Care | Payer: BC Managed Care – PPO | Source: Ambulatory Visit | Attending: Otolaryngology | Admitting: Otolaryngology

## 2023-09-21 DIAGNOSIS — R221 Localized swelling, mass and lump, neck: Secondary | ICD-10-CM

## 2023-09-21 MED ORDER — IOPAMIDOL (ISOVUE-300) INJECTION 61%
75.0000 mL | Freq: Once | INTRAVENOUS | Status: AC | PRN
Start: 2023-09-21 — End: 2023-09-21
  Administered 2023-09-21: 60 mL via INTRAVENOUS

## 2023-10-12 ENCOUNTER — Encounter (INDEPENDENT_AMBULATORY_CARE_PROVIDER_SITE_OTHER): Payer: BC Managed Care – PPO | Admitting: Ophthalmology

## 2023-10-12 DIAGNOSIS — E113212 Type 2 diabetes mellitus with mild nonproliferative diabetic retinopathy with macular edema, left eye: Secondary | ICD-10-CM

## 2023-10-12 DIAGNOSIS — H2513 Age-related nuclear cataract, bilateral: Secondary | ICD-10-CM

## 2023-10-12 DIAGNOSIS — E113291 Type 2 diabetes mellitus with mild nonproliferative diabetic retinopathy without macular edema, right eye: Secondary | ICD-10-CM | POA: Diagnosis not present

## 2023-10-12 DIAGNOSIS — Z794 Long term (current) use of insulin: Secondary | ICD-10-CM

## 2023-10-12 DIAGNOSIS — H35033 Hypertensive retinopathy, bilateral: Secondary | ICD-10-CM

## 2023-10-12 DIAGNOSIS — Z7984 Long term (current) use of oral hypoglycemic drugs: Secondary | ICD-10-CM | POA: Diagnosis not present

## 2023-10-12 DIAGNOSIS — H43813 Vitreous degeneration, bilateral: Secondary | ICD-10-CM

## 2023-10-12 DIAGNOSIS — I10 Essential (primary) hypertension: Secondary | ICD-10-CM

## 2023-10-13 ENCOUNTER — Encounter (INDEPENDENT_AMBULATORY_CARE_PROVIDER_SITE_OTHER): Payer: Self-pay | Admitting: Otolaryngology

## 2023-10-13 ENCOUNTER — Ambulatory Visit (INDEPENDENT_AMBULATORY_CARE_PROVIDER_SITE_OTHER): Payer: BC Managed Care – PPO | Admitting: Otolaryngology

## 2023-10-13 VITALS — BP 156/93 | HR 87

## 2023-10-13 DIAGNOSIS — R590 Localized enlarged lymph nodes: Secondary | ICD-10-CM

## 2023-10-13 DIAGNOSIS — M26622 Arthralgia of left temporomandibular joint: Secondary | ICD-10-CM | POA: Diagnosis not present

## 2023-10-13 DIAGNOSIS — H9202 Otalgia, left ear: Secondary | ICD-10-CM | POA: Diagnosis not present

## 2023-10-13 NOTE — Progress Notes (Signed)
 ENT Progress Note  Update 10/13/23 Discussed the use of AI scribe software for clinical note transcription with the patient, who gave verbal consent to proceed.  History of Present Illness   Kathryn Stewart is a 43 year old female with hx of stage IV endometrial cancer who presents with a palpable lump on the left side of her neck. She had CT neck done.  She has a palpable lump on the left side of her neck, corresponding to a seven millimeter node identified on a recent CT scan. No systemic symptoms such as fevers, chills, or night sweats. No history of abnormal lab work or other concerning findings in the head and neck area. She denies any changes in size of the node. No pain. Not a smoker.   She experiences persistent ear pain/discomfort on the same side as the lump (lefft side), which she associates with clenching her teeth at night.   Initial Evaluation 09/14/23 Reason for Consult: left neck mass   HPI: Discussed the use of AI scribe software for clinical note transcription with the patient, who gave verbal consent to proceed.  History of Present Illness   The patient is a 74 yoF hx of uterine cancer, presents with a six-month history of a palpable lump on the left side of their neck. The lump has slightly increased in size over this period. The patient denies any associated symptoms such as pain, numbness, weight loss, fevers, chills, rashes, difficulty swallowing, or hemoptysis. 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 They are currently considered to be in remission. The patient also has a history of asthma, which they manage with a nebulizer and an emergency albuterol inhaler as needed. They deny any history of smoking, heavy alcohol use, or recent infections. No imaging of the neck has been performed to date.     Records Reviewed:  Oncology office visit  07/19/23 Endometrial cancer (HCC)  04/06/2018 Initial Diagnosis    She presented with severe abdominal pain    04/07/2018 Imaging    She presented to the emergency room after the pain woke her up CT imaging was performed 04/07/2018 [facility].  A 6.5 x 7 x 8.1 complex right adnexal mass was found with solid and fat components consistent with a teratoma.  No lymphadenopathy.  Small to moderate ascites and inflammatory changes were seen in the pelvis follow-up ultrasound was also performed showing concern for right ovarian torsion with a large ovarian mass cystic and solid components 9.7 cm.  A left ovarian cyst seen 3.4 cm.  Endometrial thickening with hypervascularity and endometrial thickness of 24 mm moderate pelvic free fluid.    04/07/2018 Surgery    Given the concern for torsion she was taken urgently to the operating room by Dr. Elsie PARAS. McLeod 04/07/2018.  At that time operative laparoscopy, torsion of the right adnexa was noted along with thickened abnormal endometrium.  She had right salpingo-oophorectomy through a laparoscopic approach along with endometrial biopsy.      04/07/2018 Pathology Results    Right ovary showed adenocarcinoma, endometrioid type Endometrial biopsy showed adenocarcinoma   04/25/2018 Cancer Staging    Staging form: Corpus Uteri - Carcinoma and Carcinosarcoma, AJCC 8th Edition - Clinical: Stage IVA (cT4, cN0, cM0) - Signed by Lonn Hicks, MD on 04/25/2018  Endometrial cancer [facility]) H/O locally advanced EC Previously noted rectus muscle hematoma;on CTAP has resolved, normal tumor marke;  asymptomatic at present Persistent left-sided neck mass   >Referral-->ENT >Return prn or in 6 mos     Past Medical History:  Diagnosis Date   #218892 04/2018   Arthritis    Asthma    Diabetes mellitus without complication (HCC)    Insulin  dependent   GERD (gastroesophageal reflux disease)    Hypertension    PONV (postoperative nausea and vomiting)     Past Surgical  History:  Procedure Laterality Date   IR IMAGING GUIDED PORT INSERTION  05/08/2018   IR REMOVAL TUN ACCESS W/ PORT W/O FL MOD SED  02/05/2019   LAPAROSCOPIC SALPINGOOPHERECTOMY Right 2019   torsion   ROBOTIC ASSISTED TOTAL HYSTERECTOMY WITH BILATERAL SALPINGO OOPHERECTOMY N/A 04/24/2018   Procedure: XI ROBOTIC  DIAGNOSTIC LAPAROSCOPY WITH BIOPSY.;- DID NOT HAVE THIS SURGERY!   ROBOTIC ASSISTED TOTAL HYSTERECTOMY WITH BILATERAL SALPINGO OOPHERECTOMY N/A 09/20/2018   Procedure: XI ROBOTIC ASSISTED TOTAL HYSTERECTOMY WITH LEFT SALPINGO OOPHORECTOMY;  Surgeon: Anitra Freddy NOVAK, MD;  Location: [facility];  Service: Gynecology;  Laterality: N/A;    Family History  Problem Relation Age of Onset   Diabetes Mother    Cervical cancer Mother    Throat cancer Mother 19   Heart attack Father    Diabetes Brother    Ovarian cancer Maternal Aunt 21   Uterine cancer Maternal Aunt 21   Breast cancer Maternal Aunt 21   Epilepsy Paternal Aunt    Kidney cancer Brother 84   Ovarian cancer Cousin 3    Social History:  reports that she has never smoked. She has never used smokeless tobacco. She reports that she does not drink alcohol and does not use drugs.  Allergies:  Allergies  Allergen Reactions   Latex Hives and Rash    Per DR. Anitra. Question if this is a latex allergy versus Dermabond    Other Rash    Dermabond - unsure allergy    Medications: I have reviewed the patient's current medications.  The PMH, PSH, Medications, Allergies, and SH were reviewed and updated.  ROS: Constitutional: Negative for fever, weight loss and weight gain. Cardiovascular: Negative for chest pain and dyspnea on exertion. Respiratory: Is not experiencing shortness of breath at rest. Gastrointestinal: Negative for nausea and vomiting. Neurological: Negative for headaches. Psychiatric: The patient is not nervous/anxious  Blood pressure (!) 156/93, pulse 87, last menstrual period 04/07/2018, SpO2 94%.  PHYSICAL  EXAM:  Exam: General: Well-developed, well-nourished Respiratory Respiratory effort: Equal inspiration and expiration without stridor Cardiovascular Peripheral Vascular: Warm extremities with equal color/perfusion Eyes: No nystagmus with equal extraocular motion bilaterally Neuro/Psych/Balance: Patient oriented to person, place, and time; Appropriate mood and affect; Gait is intact with no imbalance; Cranial nerves I-XII are intact Head and Face Inspection: Normocephalic and atraumatic without mass or lesion Palpation: Facial skeleton intact without bony stepoffs Salivary Glands: No mass or tenderness Facial Strength: Facial motility symmetric and full bilaterally ENT Pinna: External ear intact and fully developed External canal: Canal is patent with intact skin Tympanic Membrane: Clear and mobile External Nose: No scar or anatomic deformity  Lips, Teeth, and gums: Mucosa and teeth intact and viable TMJ: pain pain to palpation left side with full mobility Oral cavity/oropharynx: No erythema or exudate, no lesions present Neck Neck and Trachea: Midline trachea without mass or lesion Thyroid: No mass or nodularity Lymphatics: palpable 1 cm node vs mass Level 2 no overlying skin changes mobile    Procedure performed during initial evaluation on 09/21/23 Preoperative diagnosis: left neck mass  hx of endometrial cancer  Postoperative diagnosis:   Same  Procedure: Flexible fiberoptic laryngoscopy  Surgeon: Elena Larry, MD  Anesthesia: Topical lidocaine  and Afrin Complications: None Condition is stable throughout exam  Indications and consent:  The patient presents to the clinic with Indirect laryngoscopy view was incomplete. Thus it was recommended that they undergo a flexible fiberoptic laryngoscopy. All of the risks, benefits, and potential complications were reviewed with the patient preoperatively and verbal informed consent was obtained.  Procedure: The patient was seated  upright in the clinic. Topical lidocaine  and Afrin were applied to the nasal cavity. After adequate anesthesia had occurred, I then proceeded to pass the flexible telescope into the nasal cavity. The nasal cavity was patent without rhinorrhea or polyp. The nasopharynx was also patent without mass or lesion. The base of tongue was visualized and was normal. There were no signs of pooling of secretions in the piriform sinuses. The true vocal folds were mobile bilaterally. There were no signs of glottic or supraglottic mucosal lesion or mass. There was moderate interarytenoid pachydermia and post cricoid edema. The telescope was then slowly withdrawn and the patient tolerated the procedure throughout.  Studies Reviewed: 07/06/23 CBC w/diff normal   Assessment/Plan: Encounter Diagnoses  Name Primary?   Cervical lymphadenopathy Yes   Arthralgia of left temporomandibular joint    Otalgia of left ear      Assessment and Plan    Neck Mass left side  Six-month history of a left-sided neck lump without associated symptoms (pain, numbness, weight loss, fevers, chills, rashes, dysphagia, hemoptysis, dyspnea, voice changes). No smoking or heavy alcohol use hx. Hx of Stage IV endometrial cancer treated in 2019 now considered in remission. Physical exam reveals a palpable mass left level 2-3 ~ 2 cm in size. Differential includes reactive versus pathologic lymphadenopathy. Flexible scope exam overall unremarkable without masses or lesions. Unlikely related to previous endometrial cancer. Explained need for CT scan to evaluate mass characteristics and potential biopsy. Reassured her that she had normal physical exam and recent labs showed normal CBC w/diff (Recent CBC (07/06/2023) normal (WBC, Hgb, Hct, platelets). - Order CT scan of the neck with contrast - Evaluate CT scan results to determine if she needs biopsy - Schedule follow-up appointment post-CT results  Asthma Asthma with occasional exacerbations  requiring nebulizer treatment (~3 times/month). Uses emergency albuterol inhaler PRN. 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  - Continue regular oncology follow-up for cancer surveillance  Follow-up - Schedule follow-up appointment post-CT results - Contact office if no communication by end of next week.     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 Examination revealed TMJ tenderness, and she has a history of teeth clenching. The ear exam was normal, ruling out an ear infection. TMJ disorder can cause referred ear pain. Discussed stress reduction techniques and wearing a mouth guard to manage symptoms. - Recommend Motrin for pain management - Advise warm compresses - Suggest a softer diet - Discuss stress reduction techniques - Recommend wearing a mouth guard  Cervical Lymphadenopathy A 7 mm lymph node on the left side of the neck noted on CT (level II) and corresponds with palpable node on exam, is likely reactive, possibly due to past infection or inflammation. No  systemic symptoms or abnormal lab work suggest malignancy. She did not have any masses or lesions on flexible scope exam done during her initial evaluation. We discussed that this node is unlikely to be related to her hx of endometrial cancer since it drains head and neck portion of her body. She had no other masses or lesions and contralateral side of her neck had 6 mm node noted on CT as well. Plan to monitor with reimaging in six months. Discussed that if the node increases in size or new nodes develop, further evaluation will be warranted. - Schedule follow-up appointment in six months - Consider repeat CT scan in six months if nodes  will remain the same size or increase in size  - She was advised to return sooner if her sx change or if she develops new sx.      Elena Larry, MD Otolaryngology [facility] Phone: [phone] Fax: [phone]    10/13/2023, 8:57 AM

## 2023-10-13 NOTE — Patient Instructions (Signed)
  TMJ (Temporomandibular Joint Syndrome) The temporomandibular (tem-puh-roe-man-DIB-u-lur) joint (TMJ) acts like a sliding hinge, connecting your jawbone to your skull. You have one joint on each side of your jaw. TMJ disorders -- a type of temporomandibular disorder or TMD -- can cause pain in your jaw joint and in the muscles that control jaw movement.  The exact cause of a person's TMJ disorder is often difficult to determine. Your pain may be due to a combination of factors, such as genetics, arthritis or jaw injury. Some people who have jaw pain also tend to clench or grind their teeth (bruxism), although many people habitually clench or grind their teeth and never develop TMJ disorders.  In most cases, the pain and discomfort associated with TMJ disorders is temporary and can be relieved with self-managed care or nonsurgical treatments. This includes stress reduction, softer diet when the pain is present, anti-inflammatory pain medications such as Motrin and warm compresses.

## 2023-11-13 ENCOUNTER — Encounter: Payer: Self-pay | Admitting: Unknown Physician Specialty

## 2024-01-15 ENCOUNTER — Emergency Department (HOSPITAL_COMMUNITY)
Admission: EM | Admit: 2024-01-15 | Discharge: 2024-01-15 | Disposition: A | Discharge status: Home / Self Care | Attending: Emergency Medicine | Admitting: Emergency Medicine

## 2024-01-15 ENCOUNTER — Emergency Department (HOSPITAL_COMMUNITY)

## 2024-01-15 ENCOUNTER — Other Ambulatory Visit: Payer: Self-pay

## 2024-01-15 ENCOUNTER — Encounter (HOSPITAL_COMMUNITY): Payer: Self-pay | Admitting: *Deleted

## 2024-01-15 DIAGNOSIS — Z7984 Long term (current) use of oral hypoglycemic drugs: Secondary | ICD-10-CM | POA: Diagnosis not present

## 2024-01-15 DIAGNOSIS — R739 Hyperglycemia, unspecified: Secondary | ICD-10-CM

## 2024-01-15 DIAGNOSIS — Z79899 Other long term (current) drug therapy: Secondary | ICD-10-CM | POA: Diagnosis not present

## 2024-01-15 DIAGNOSIS — Z8673 Personal history of transient ischemic attack (TIA), and cerebral infarction without residual deficits: Secondary | ICD-10-CM | POA: Diagnosis not present

## 2024-01-15 DIAGNOSIS — Z91040 Latex allergy status: Secondary | ICD-10-CM | POA: Insufficient documentation

## 2024-01-15 DIAGNOSIS — Z794 Long term (current) use of insulin: Secondary | ICD-10-CM | POA: Insufficient documentation

## 2024-01-15 DIAGNOSIS — Z7951 Long term (current) use of inhaled steroids: Secondary | ICD-10-CM | POA: Insufficient documentation

## 2024-01-15 DIAGNOSIS — E1165 Type 2 diabetes mellitus with hyperglycemia: Secondary | ICD-10-CM | POA: Insufficient documentation

## 2024-01-15 DIAGNOSIS — H538 Other visual disturbances: Secondary | ICD-10-CM | POA: Diagnosis present

## 2024-01-15 DIAGNOSIS — J45909 Unspecified asthma, uncomplicated: Secondary | ICD-10-CM | POA: Insufficient documentation

## 2024-01-15 DIAGNOSIS — R221 Localized swelling, mass and lump, neck: Secondary | ICD-10-CM | POA: Diagnosis not present

## 2024-01-15 DIAGNOSIS — I10 Essential (primary) hypertension: Secondary | ICD-10-CM | POA: Insufficient documentation

## 2024-01-15 HISTORY — DX: Exudative age-related macular degeneration, unspecified eye, stage unspecified: H35.3290

## 2024-01-15 LAB — BASIC METABOLIC PANEL WITH GFR
Anion gap: 8 (ref 5–15)
BUN: 30 mg/dL — ABNORMAL HIGH (ref 6–20)
CO2: 28 mmol/L (ref 22–32)
Calcium: 9.5 mg/dL (ref 8.9–10.3)
Chloride: 102 mmol/L (ref 98–111)
Creatinine, Ser: 1.15 mg/dL — ABNORMAL HIGH (ref 0.44–1.00)
GFR, Estimated: >60 mL/min (ref >60)
Glucose, Bld: 299 mg/dL — ABNORMAL HIGH (ref 70–99)
Potassium: 4 mmol/L (ref 3.5–5.1)
Sodium: 138 mmol/L (ref 135–145)

## 2024-01-15 LAB — CBC WITH DIFFERENTIAL/PLATELET
Abs Immature Granulocytes: 0.17 10*3/uL — ABNORMAL HIGH (ref 0.00–0.07)
Basophils Absolute: 0 10*3/uL (ref 0.0–0.1)
Basophils Relative: 1 %
Eosinophils Absolute: 0.1 10*3/uL (ref 0.0–0.5)
Eosinophils Relative: 2 %
HCT: 34.1 % — ABNORMAL LOW (ref 36.0–46.0)
Hemoglobin: 10.9 g/dL — ABNORMAL LOW (ref 12.0–15.0)
Immature Granulocytes: 3 %
Lymphocytes Relative: 21 %
Lymphs Abs: 1.1 10*3/uL (ref 0.7–4.0)
MCH: 28.7 pg (ref 26.0–34.0)
MCHC: 32 g/dL (ref 30.0–36.0)
MCV: 89.7 fL (ref 80.0–100.0)
Monocytes Absolute: 0.4 10*3/uL (ref 0.1–1.0)
Monocytes Relative: 7 %
Neutro Abs: 3.5 10*3/uL (ref 1.7–7.7)
Neutrophils Relative %: 66 %
Platelets: 218 10*3/uL (ref 150–400)
RBC: 3.8 MIL/uL — ABNORMAL LOW (ref 3.87–5.11)
RDW: 14.6 % (ref 11.5–15.5)
WBC: 5.3 10*3/uL (ref 4.0–10.5)
nRBC: 0 % (ref 0.0–0.2)

## 2024-01-15 LAB — URINALYSIS, ROUTINE W REFLEX MICROSCOPIC
Bacteria, UA: NONE SEEN
Bilirubin Urine: NEGATIVE
Glucose, UA: >=500 mg/dL — AB
Ketones, ur: NEGATIVE mg/dL
Nitrite: NEGATIVE
Protein, ur: NEGATIVE mg/dL
Specific Gravity, Urine: 1.008 (ref 1.005–1.030)
pH: 7 (ref 5.0–8.0)

## 2024-01-15 LAB — CBG MONITORING, ED: Glucose-Capillary: 274 mg/dL — ABNORMAL HIGH (ref 70–99)

## 2024-01-15 LAB — PREGNANCY, URINE: Preg Test, Ur: NEGATIVE

## 2024-01-15 MED ORDER — ONDANSETRON HCL 4 MG/2ML IJ SOLN
4.0000 mg | Freq: Once | INTRAMUSCULAR | Status: AC
  Administered 2024-01-15: 4 mg via INTRAVENOUS
  Filled 2024-01-15: qty 2

## 2024-01-15 MED ORDER — MORPHINE SULFATE (PF) 4 MG/ML IV SOLN
3.0000 mg | Freq: Once | INTRAVENOUS | Status: AC
Start: 2024-01-15 — End: 2024-01-15
  Administered 2024-01-15: 3 mg via INTRAVENOUS
  Filled 2024-01-15: qty 1

## 2024-01-15 MED ORDER — SODIUM CHLORIDE 0.9 % IV BOLUS
1000.0000 mL | Freq: Once | INTRAVENOUS | Status: AC
  Administered 2024-01-15: 1000 mL via INTRAVENOUS

## 2024-01-15 NOTE — Discharge Instructions (Signed)
 As discussed, I recommend that you follow-up with your ophthalmologist for recheck as well as your endocrinologist regarding your elevated blood sugars.  Your blood pressure here has improved.  Continue to take your medications as directed.  I have also listed one of the ear nose and throat providers in [place] that you may contact regarding the mass of your left neck. Return to ER for any worsening symptoms

## 2024-01-15 NOTE — ED Triage Notes (Signed)
 Pt states she noticed when she woke up this morning she had blurry vision. Bp has been elevated today.  Takes bp meds and has been taking as prescribed.  Told to come to ED by pcp.

## 2024-01-15 NOTE — ED Provider Notes (Signed)
 [place] EMERGENCY DEPARTMENT AT [facility] Provider Note   CSN: [id number] Arrival date & time: 01/15/24  1337     History {Add pertinent medical, surgical, social history, OB history to HPI:1} Chief Complaint  Patient presents with   Blurred Vision    Kathryn Stewart is a 43 y.o. female.  HPI     Home Medications Prior to Admission medications   Medication Sig Start Date End Date Taking? Authorizing Provider  albuterol (VENTOLIN HFA) 108 (90 Base) MCG/ACT inhaler INHALE 2 PUFFS BY MOUTH EVERY 4 TO 6 HOURS AS NEEDED 02/14/19   [provider]  amLODipine  (NORVASC ) 5 MG tablet Take 5 mg by mouth daily.    [provider]  atorvastatin (LIPITOR) 40 MG tablet Take 40 mg by mouth daily.    [provider]  BD PEN NEEDLE NANO U/F 32G X 4 MM MISC USE WITH INSULIN  PEN AS DIRECTED 06/17/19   [provider]  BESIVANCE 0.6 % SUSP Apply to eye. After monthly eye injections. 09/29/20   [provider]  cetirizine (ZYRTEC) 10 MG tablet Take 10 mg by mouth daily.    [provider]  Continuous Glucose Sensor (DEXCOM G7 SENSOR) MISC by Does not apply route. 10/05/23   [provider]  escitalopram (LEXAPRO) 20 MG tablet Take 20 mg by mouth daily. 03/23/20   [provider]  FARXIGA 10 MG TABS tablet Take 10 mg by mouth daily. Patient not taking: Reported on 10/13/2023 02/14/19   [provider]  fenofibrate (TRICOR) 145 MG tablet Take 145 mg by mouth daily. 04/20/20   [provider]  fluticasone (FLONASE) 50 MCG/ACT nasal spray Place 1 spray into both nostrils daily as needed for allergies.  10/18/18   [provider]  gabapentin  (NEURONTIN ) 100 MG capsule Take 2 capsules by mouth at bedtime. 02/14/23   [provider]  glimepiride (AMARYL) 4 MG tablet Take 4 mg by mouth 2 (two) times daily.    [provider]  insulin  lispro (HUMALOG) 100 UNIT/ML KwikPen Inject into the  skin. 06/15/23   [provider]  Insulin  Pen Needle 32G X 4 MM MISC Use with insulin  pen as directed 03/13/19   [provider]  JARDIANCE 25 MG TABS tablet Take 25 mg by mouth daily.    [provider]  lisinopril  (PRINIVIL ,ZESTRIL ) 10 MG tablet Take 10 mg by mouth at bedtime.  03/24/18   [provider]  lisinopril -hydrochlorothiazide (ZESTORETIC) 20-25 MG tablet Take 1 tablet by mouth daily.    [provider]  metFORMIN  (GLUCOPHAGE ) 1000 MG tablet Take 1,000 mg by mouth 2 (two) times daily with a meal.    [provider]  pantoprazole (PROTONIX) 40 MG tablet Take 40 mg by mouth daily. 09/07/19   [provider]  TOUJEO  MAX SOLOSTAR 300 UNIT/ML Solostar Pen Inject 80 Units into the skin daily. Maximum of 100 units daily    [provider]      Allergies    Latex and Other    Review of Systems   Review of Systems  Physical Exam Updated Vital Signs BP (!) 140/85   Pulse 94   Temp 98.2 F (36.8 C)   Resp 20   Ht 5\' 1"  (1.549 m)   Wt 84.8 kg   LMP 04/07/2018 (Within Days)   SpO2 98%   BMI 35.33 kg/m  Physical Exam  ED Results / Procedures / Treatments   Labs (all labs ordered are listed,  but only abnormal results are displayed) Labs Reviewed  CBC WITH DIFFERENTIAL/PLATELET - Abnormal; Notable for the following components:      Result Value   RBC 3.80 (*)    Hemoglobin 10.9 (*)    HCT 34.1 (*)    Abs Immature Granulocytes 0.17 (*)    All other components within normal limits  BASIC METABOLIC PANEL WITH GFR - Abnormal; Notable for the following components:   Glucose, Bld 299 (*)    BUN 30 (*)    Creatinine, Ser 1.15 (*)    All other components within normal limits  URINALYSIS, ROUTINE W REFLEX MICROSCOPIC - Abnormal; Notable for the following components:   Color, Urine STRAW (*)    Glucose, UA >=500 (*)    Hgb urine dipstick SMALL (*)    Leukocytes,Ua LARGE (*)    Non Squamous Epithelial 0-5 (*)     All other components within normal limits  CBG MONITORING, ED - Abnormal; Notable for the following components:   Glucose-Capillary 274 (*)    All other components within normal limits  URINE CULTURE  PREGNANCY, URINE    EKG None  Radiology CT Head Wo Contrast Result Date: 01/15/2024 CLINICAL DATA:  Headache EXAM: CT HEAD WITHOUT CONTRAST TECHNIQUE: Contiguous axial images were obtained from the base of the skull through the vertex without intravenous contrast. RADIATION DOSE REDUCTION: This exam was performed according to the departmental dose-optimization program which includes automated exposure control, adjustment of the mA and/or kV according to patient size and/or use of iterative reconstruction technique. COMPARISON:  CT brain 02/05/2016 FINDINGS: Brain: No evidence of acute infarction, hemorrhage, hydrocephalus, extra-axial collection or mass lesion/mass effect. Vascular: No hyperdense vessel or unexpected calcification. Skull: Normal. Negative for fracture or focal lesion. Sinuses/Orbits: No acute finding. Other: None IMPRESSION: Negative non contrasted CT appearance of the brain. Electronically Signed   By: Esmeralda Hedge M.D.   On: 01/15/2024 16:45    Procedures Procedures  {Document cardiac monitor, telemetry assessment procedure when appropriate:1}     Visual Acuity  Right Eye Distance: 20/50 Left Eye Distance: 20/70 Bilateral Distance: 20/70   Medications Ordered in ED Medications  ondansetron  (ZOFRAN ) injection 4 mg (4 mg Intravenous Given 01/15/24 1518)  morphine (PF) 4 MG/ML injection 3 mg (3 mg Intravenous Given 01/15/24 1519)  sodium chloride  0.9 % bolus 1,000 mL (1,000 mLs Intravenous New Bag/Given 01/15/24 1645)    ED Course/ Medical Decision Making/ A&P   {   Click here for ABCD2, HEART and other calculatorsREFRESH Note before signing :1}                              Medical Decision Making Amount and/or Complexity of Data Reviewed Labs:  ordered. Radiology: ordered. Discussion of management or test interpretation with external provider(s): On recheck, pt feeling better after IVF's pain medication.  Pt does not have her glasses with her for the visual acuity.  She states her visual changes have improved and her headache has resolved.    Risk Prescription drug management.     {Document critical care time when appropriate:1} {Document review of labs and clinical decision tools ie heart score, Chads2Vasc2 etc:1}  {Document your independent review of radiology images, and any outside records:1} {Document your discussion with family members, caretakers, and with consultants:1} {Document social determinants of health affecting pt's care:1} {Document your decision making why or why not admission, treatments were needed:1} Final Clinical Impression(s) / ED Diagnoses  Final diagnoses:  None    Rx / DC Orders ED Discharge Orders     None

## 2024-01-16 ENCOUNTER — Telehealth (HOSPITAL_COMMUNITY): Payer: Self-pay | Admitting: Emergency Medicine

## 2024-01-16 MED ORDER — CEPHALEXIN 500 MG PO CAPS: 500.0000 mg | ORAL_CAPSULE | Freq: Four times a day (QID) | ORAL | 0 refills | Status: DC

## 2024-01-16 NOTE — Telephone Encounter (Cosign Needed)
   Possible cystitis.  Prescription sent to patient's pharmacy of choice.

## 2024-01-17 LAB — URINE CULTURE

## 2024-02-01 ENCOUNTER — Encounter (INDEPENDENT_AMBULATORY_CARE_PROVIDER_SITE_OTHER): Payer: BC Managed Care – PPO | Admitting: Ophthalmology

## 2024-02-01 DIAGNOSIS — E113391 Type 2 diabetes mellitus with moderate nonproliferative diabetic retinopathy without macular edema, right eye: Secondary | ICD-10-CM | POA: Diagnosis not present

## 2024-02-01 DIAGNOSIS — Z794 Long term (current) use of insulin: Secondary | ICD-10-CM | POA: Diagnosis not present

## 2024-02-01 DIAGNOSIS — I10 Essential (primary) hypertension: Secondary | ICD-10-CM

## 2024-02-01 DIAGNOSIS — E113312 Type 2 diabetes mellitus with moderate nonproliferative diabetic retinopathy with macular edema, left eye: Secondary | ICD-10-CM

## 2024-02-01 DIAGNOSIS — Z7984 Long term (current) use of oral hypoglycemic drugs: Secondary | ICD-10-CM

## 2024-02-01 DIAGNOSIS — H43813 Vitreous degeneration, bilateral: Secondary | ICD-10-CM

## 2024-02-01 DIAGNOSIS — H35033 Hypertensive retinopathy, bilateral: Secondary | ICD-10-CM

## 2024-04-12 ENCOUNTER — Ambulatory Visit (INDEPENDENT_AMBULATORY_CARE_PROVIDER_SITE_OTHER): Payer: BC Managed Care – PPO | Admitting: Otolaryngology

## 2024-05-23 ENCOUNTER — Encounter (INDEPENDENT_AMBULATORY_CARE_PROVIDER_SITE_OTHER): Admitting: Ophthalmology

## 2024-05-23 DIAGNOSIS — H43813 Vitreous degeneration, bilateral: Secondary | ICD-10-CM

## 2024-05-23 DIAGNOSIS — Z7984 Long term (current) use of oral hypoglycemic drugs: Secondary | ICD-10-CM | POA: Diagnosis not present

## 2024-05-23 DIAGNOSIS — E113391 Type 2 diabetes mellitus with moderate nonproliferative diabetic retinopathy without macular edema, right eye: Secondary | ICD-10-CM

## 2024-05-23 DIAGNOSIS — E113312 Type 2 diabetes mellitus with moderate nonproliferative diabetic retinopathy with macular edema, left eye: Secondary | ICD-10-CM

## 2024-05-23 DIAGNOSIS — Z794 Long term (current) use of insulin: Secondary | ICD-10-CM

## 2024-05-23 DIAGNOSIS — H2513 Age-related nuclear cataract, bilateral: Secondary | ICD-10-CM

## 2024-05-23 DIAGNOSIS — I10 Essential (primary) hypertension: Secondary | ICD-10-CM

## 2024-05-23 DIAGNOSIS — H35033 Hypertensive retinopathy, bilateral: Secondary | ICD-10-CM

## 2024-07-25 ENCOUNTER — Encounter (INDEPENDENT_AMBULATORY_CARE_PROVIDER_SITE_OTHER): Payer: Self-pay | Admitting: *Deleted

## 2024-09-19 ENCOUNTER — Encounter (INDEPENDENT_AMBULATORY_CARE_PROVIDER_SITE_OTHER): Admitting: Ophthalmology

## 2024-09-19 DIAGNOSIS — Z7984 Long term (current) use of oral hypoglycemic drugs: Secondary | ICD-10-CM

## 2024-09-19 DIAGNOSIS — E113291 Type 2 diabetes mellitus with mild nonproliferative diabetic retinopathy without macular edema, right eye: Secondary | ICD-10-CM

## 2024-09-19 DIAGNOSIS — H43813 Vitreous degeneration, bilateral: Secondary | ICD-10-CM

## 2024-09-19 DIAGNOSIS — I10 Essential (primary) hypertension: Secondary | ICD-10-CM | POA: Diagnosis not present

## 2024-09-19 DIAGNOSIS — Z794 Long term (current) use of insulin: Secondary | ICD-10-CM

## 2024-09-19 DIAGNOSIS — E113212 Type 2 diabetes mellitus with mild nonproliferative diabetic retinopathy with macular edema, left eye: Secondary | ICD-10-CM

## 2024-09-19 DIAGNOSIS — H2513 Age-related nuclear cataract, bilateral: Secondary | ICD-10-CM

## 2024-09-19 DIAGNOSIS — H35033 Hypertensive retinopathy, bilateral: Secondary | ICD-10-CM | POA: Diagnosis not present

## 2024-10-09 ENCOUNTER — Telehealth (INDEPENDENT_AMBULATORY_CARE_PROVIDER_SITE_OTHER): Payer: Self-pay

## 2024-10-09 NOTE — Telephone Encounter (Signed)
 Who is your primary care physician: Jordan Zendel, [place]  Reasons for the colonoscopy: screening, family history colon cancer  Have you had a colonoscopy before?  no  Do you have family history of colon cancer? Yes, mother   Previous colonoscopy with polyps removed? no  Do you have a history colorectal cancer?   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 no  Do you have a pacemaker/defibrillator?   no  Have you had endocarditis/atrial fibrillation? no  Have you had joint replacement within the last 12 months?  no  Do you tend to be constipated or have to use laxatives? yes  Do you have any history of drugs or alcohol?  no  Do you use supplemental oxygen ?  no  Have you had a stroke or heart attack within the last 6 months? no  Do you take weight loss medication?  no  For female patients: have you had a hysterectomy?  yes                                     are you post menopausal?                                                   do you still have your menstrual cycle? no      Do you take any blood-thinning medications such as: (aspirin, warfarin, Plavix, Aggrenox)  no  If yes we need the name, milligram, dosage and who is prescribing doctor   Current Outpatient Medications  Medication Sig Dispense Refill   albuterol (VENTOLIN HFA) 108 (90 Base) MCG/ACT inhaler INHALE 2 PUFFS BY MOUTH EVERY 4 TO 6 HOURS AS NEEDED     amLODipine  (NORVASC ) 5 MG tablet Take 5 mg by mouth daily.     atorvastatin (LIPITOR) 40 MG tablet Take 40 mg by mouth daily.     BD PEN NEEDLE NANO U/F 32G X 4 MM MISC USE WITH INSULIN  PEN AS DIRECTED     BESIVANCE 0.6 % SUSP Apply to eye. After monthly eye injections.     busPIRone (BUSPAR) 10 MG tablet Take 10 mg by mouth 3 (three) times daily.     Continuous Glucose Sensor (DEXCOM G7 SENSOR) MISC by Does not apply route.     escitalopram (LEXAPRO) 20 MG tablet Take 20 mg by mouth daily.      fenofibrate (TRICOR) 145 MG tablet Take 145 mg by mouth daily.     gabapentin  (NEURONTIN ) 100 MG capsule Take 2 capsules by mouth at bedtime.     insulin  lispro (HUMALOG) 100 UNIT/ML KwikPen Inject into the skin.     Insulin  Pen Needle 32G X 4 MM MISC Use with insulin  pen as directed     JARDIANCE 25 MG TABS tablet Take 25 mg by mouth daily.     lisinopril -hydrochlorothiazide (ZESTORETIC) 20-25 MG tablet Take 1 tablet by mouth daily.     magnesium oxide (MAG-OX) 400 (240 Mg) MG tablet Take 400 mg by mouth daily.     pantoprazole (PROTONIX) 40 MG tablet Take 40 mg by mouth daily.     pioglitazone (ACTOS) 45 MG tablet Take 45 mg by mouth  daily.     prochlorperazine  (COMPAZINE ) 10 MG tablet Take 10 mg by mouth every 6 (six) hours as needed for nausea or vomiting.     TOUJEO  MAX SOLOSTAR 300 UNIT/ML Solostar Pen Inject 80 Units into the skin daily. Maximum of 100 units daily     No current facility-administered medications for this visit.    Allergies[1]  Pharmacy: [facility]  Primary Insurance Name: [employer] number where you can be reached: [phone]     [1]  Allergies Allergen Reactions   Latex Hives and Rash    Per DR. Anitra. Question if this is a latex allergy versus Dermabond    Other Rash    Dermabond - unsure allergy

## 2024-10-09 NOTE — Telephone Encounter (Signed)
 Ok to schedule.  Room Any.   Insulin  as per protocol   Thanks,  Priscille Shadduck Faizan Timiko Offutt, MD Gastroenterology and Hepatology [facility] Gastroenterology

## 2024-10-10 ENCOUNTER — Encounter (INDEPENDENT_AMBULATORY_CARE_PROVIDER_SITE_OTHER): Payer: Self-pay | Admitting: *Deleted

## 2024-10-10 MED ORDER — PEG 3350-KCL-NA BICARB-NACL 420 G PO SOLR: 4000.0000 mL | Freq: Once | ORAL | 0 refills | Status: AC

## 2024-10-10 NOTE — Telephone Encounter (Signed)
 Called the insurance policy, [place] is not required for TCS

## 2024-10-10 NOTE — Telephone Encounter (Signed)
 Referral completed, TCS apt letter sent to PCP

## 2024-10-10 NOTE — Addendum Note (Signed)
 Addended by: DALLIE LIONEL RAMAN on: 10/10/2024 02:27 PM   Modules accepted: Orders

## 2024-10-10 NOTE — Telephone Encounter (Signed)
 Spoke with patient, scheduled TCS for 10/31/2024 at 8:30am. Rx sent to pharmacy. Instructions sent to mychart.

## 2024-10-28 ENCOUNTER — Other Ambulatory Visit (HOSPITAL_COMMUNITY)

## 2024-10-31 ENCOUNTER — Encounter (HOSPITAL_COMMUNITY): Payer: Self-pay

## 2024-10-31 ENCOUNTER — Ambulatory Visit (HOSPITAL_COMMUNITY): Admit: 2024-10-31 | Admitting: Gastroenterology

## 2025-01-16 ENCOUNTER — Encounter (INDEPENDENT_AMBULATORY_CARE_PROVIDER_SITE_OTHER): Admitting: Ophthalmology
